# Patient Record
Sex: Male | Born: 1965 | Race: White | Hispanic: No | Marital: Single | Smoking: Never smoker
Health system: Southern US, Community
[De-identification: ages and names within clinical notes are randomized; demographics above are authoritative.]

## PROBLEM LIST (undated history)

## (undated) DIAGNOSIS — I2699 Other pulmonary embolism without acute cor pulmonale: Secondary | ICD-10-CM

## (undated) DIAGNOSIS — I4891 Unspecified atrial fibrillation: Secondary | ICD-10-CM

## (undated) DIAGNOSIS — G959 Disease of spinal cord, unspecified: Secondary | ICD-10-CM

## (undated) DIAGNOSIS — N390 Urinary tract infection, site not specified: Secondary | ICD-10-CM

## (undated) DIAGNOSIS — N319 Neuromuscular dysfunction of bladder, unspecified: Secondary | ICD-10-CM

## (undated) DIAGNOSIS — I10 Essential (primary) hypertension: Secondary | ICD-10-CM

## (undated) DIAGNOSIS — M5030 Other cervical disc degeneration, unspecified cervical region: Secondary | ICD-10-CM

## (undated) DIAGNOSIS — F32A Depression, unspecified: Secondary | ICD-10-CM

## (undated) DIAGNOSIS — G4730 Sleep apnea, unspecified: Secondary | ICD-10-CM

## (undated) DIAGNOSIS — Z86711 Personal history of pulmonary embolism: Secondary | ICD-10-CM

## (undated) DIAGNOSIS — Z86718 Personal history of other venous thrombosis and embolism: Secondary | ICD-10-CM

## (undated) DIAGNOSIS — N400 Enlarged prostate without lower urinary tract symptoms: Secondary | ICD-10-CM

## (undated) DIAGNOSIS — L719 Rosacea, unspecified: Secondary | ICD-10-CM

## (undated) DIAGNOSIS — F419 Anxiety disorder, unspecified: Secondary | ICD-10-CM

## (undated) DIAGNOSIS — G3109 Other frontotemporal dementia: Secondary | ICD-10-CM

## (undated) DIAGNOSIS — R7989 Other specified abnormal findings of blood chemistry: Secondary | ICD-10-CM

## (undated) DIAGNOSIS — E669 Obesity, unspecified: Secondary | ICD-10-CM

## (undated) DIAGNOSIS — D510 Vitamin B12 deficiency anemia due to intrinsic factor deficiency: Secondary | ICD-10-CM

## (undated) DIAGNOSIS — F329 Major depressive disorder, single episode, unspecified: Secondary | ICD-10-CM

## (undated) DIAGNOSIS — F0280 Dementia in other diseases classified elsewhere without behavioral disturbance: Secondary | ICD-10-CM

## (undated) DIAGNOSIS — R945 Abnormal results of liver function studies: Secondary | ICD-10-CM

## (undated) HISTORY — DX: Other pulmonary embolism without acute cor pulmonale: I26.99

## (undated) HISTORY — DX: Personal history of pulmonary embolism: Z86.711

## (undated) HISTORY — DX: Personal history of other venous thrombosis and embolism: Z86.718

## (undated) HISTORY — DX: Abnormal results of liver function studies: R94.5

## (undated) HISTORY — PX: KIDNEY STONE SURGERY: SHX686

## (undated) HISTORY — DX: Unspecified atrial fibrillation: I48.91

## (undated) HISTORY — DX: Urinary tract infection, site not specified: N39.0

## (undated) HISTORY — DX: Rosacea, unspecified: L71.9

## (undated) HISTORY — DX: Disease of spinal cord, unspecified: G95.9

## (undated) HISTORY — PX: SUPRAPUBIC CATHETER INSERTION: SUR719

## (undated) HISTORY — DX: Neuromuscular dysfunction of bladder, unspecified: N31.9

## (undated) HISTORY — DX: Depression, unspecified: F32.A

## (undated) HISTORY — DX: Benign prostatic hyperplasia without lower urinary tract symptoms: N40.0

## (undated) HISTORY — DX: Other cervical disc degeneration, unspecified cervical region: M50.30

## (undated) HISTORY — DX: Other specified abnormal findings of blood chemistry: R79.89

## (undated) HISTORY — DX: Obesity, unspecified: E66.9

## (undated) HISTORY — DX: Major depressive disorder, single episode, unspecified: F32.9

## (undated) HISTORY — DX: Sleep apnea, unspecified: G47.30

## (undated) HISTORY — DX: Essential (primary) hypertension: I10

## (undated) HISTORY — DX: Dementia in other diseases classified elsewhere, unspecified severity, without behavioral disturbance, psychotic disturbance, mood disturbance, and anxiety: G31.09

## (undated) HISTORY — DX: Dementia in other diseases classified elsewhere, unspecified severity, without behavioral disturbance, psychotic disturbance, mood disturbance, and anxiety: F02.80

## (undated) HISTORY — DX: Anxiety disorder, unspecified: F41.9

## (undated) HISTORY — DX: Vitamin B12 deficiency anemia due to intrinsic factor deficiency: D51.0

---

## 2004-03-22 ENCOUNTER — Other Ambulatory Visit: Payer: Self-pay

## 2004-11-22 ENCOUNTER — Inpatient Hospital Stay: Payer: Self-pay | Admitting: Family Medicine

## 2004-11-29 ENCOUNTER — Inpatient Hospital Stay: Payer: Self-pay | Admitting: Family Medicine

## 2005-01-22 ENCOUNTER — Emergency Department: Payer: Self-pay | Admitting: Emergency Medicine

## 2007-01-14 ENCOUNTER — Inpatient Hospital Stay: Payer: Self-pay | Admitting: Internal Medicine

## 2007-04-18 ENCOUNTER — Emergency Department: Payer: Self-pay | Admitting: Emergency Medicine

## 2007-04-30 ENCOUNTER — Ambulatory Visit: Payer: Self-pay | Admitting: Urology

## 2007-05-02 ENCOUNTER — Ambulatory Visit: Payer: Self-pay | Admitting: Urology

## 2007-05-17 ENCOUNTER — Ambulatory Visit: Payer: Self-pay | Admitting: Urology

## 2007-06-11 ENCOUNTER — Ambulatory Visit: Payer: Self-pay | Admitting: Urology

## 2008-05-16 ENCOUNTER — Emergency Department: Payer: Self-pay

## 2008-06-15 ENCOUNTER — Inpatient Hospital Stay: Payer: Self-pay | Admitting: Gastroenterology

## 2008-10-17 ENCOUNTER — Emergency Department: Payer: Self-pay | Admitting: Emergency Medicine

## 2010-01-14 ENCOUNTER — Inpatient Hospital Stay: Payer: Self-pay | Admitting: Student

## 2010-05-13 ENCOUNTER — Emergency Department: Payer: Self-pay | Admitting: Emergency Medicine

## 2010-10-20 ENCOUNTER — Emergency Department: Payer: Self-pay | Admitting: Emergency Medicine

## 2010-12-12 ENCOUNTER — Ambulatory Visit: Payer: Self-pay | Admitting: Urology

## 2011-01-23 ENCOUNTER — Emergency Department: Payer: Self-pay | Admitting: Emergency Medicine

## 2011-02-07 ENCOUNTER — Ambulatory Visit: Payer: Self-pay | Admitting: Urology

## 2011-02-26 ENCOUNTER — Emergency Department: Payer: Self-pay | Admitting: Emergency Medicine

## 2011-05-12 ENCOUNTER — Ambulatory Visit: Payer: Self-pay | Admitting: Family Medicine

## 2011-05-13 ENCOUNTER — Inpatient Hospital Stay: Payer: Self-pay | Admitting: Internal Medicine

## 2011-09-12 DIAGNOSIS — I4891 Unspecified atrial fibrillation: Secondary | ICD-10-CM

## 2011-09-12 HISTORY — DX: Unspecified atrial fibrillation: I48.91

## 2011-09-25 ENCOUNTER — Encounter: Payer: Self-pay | Admitting: Cardiothoracic Surgery

## 2011-09-25 ENCOUNTER — Encounter: Payer: Self-pay | Admitting: Nurse Practitioner

## 2011-10-13 ENCOUNTER — Encounter: Payer: Self-pay | Admitting: Nurse Practitioner

## 2011-10-13 ENCOUNTER — Encounter: Payer: Self-pay | Admitting: Cardiothoracic Surgery

## 2011-11-16 ENCOUNTER — Encounter: Payer: Self-pay | Admitting: Nurse Practitioner

## 2011-11-16 ENCOUNTER — Encounter: Payer: Self-pay | Admitting: Cardiothoracic Surgery

## 2011-12-12 ENCOUNTER — Ambulatory Visit: Payer: Self-pay | Admitting: Urology

## 2011-12-15 ENCOUNTER — Ambulatory Visit: Payer: Self-pay | Admitting: Rheumatology

## 2012-02-04 ENCOUNTER — Emergency Department: Payer: Self-pay | Admitting: Emergency Medicine

## 2012-04-01 ENCOUNTER — Encounter: Payer: Self-pay | Admitting: Nurse Practitioner

## 2012-04-01 ENCOUNTER — Encounter: Payer: Self-pay | Admitting: Cardiothoracic Surgery

## 2012-04-11 ENCOUNTER — Encounter: Payer: Self-pay | Admitting: Nurse Practitioner

## 2012-04-11 ENCOUNTER — Encounter: Payer: Self-pay | Admitting: Cardiothoracic Surgery

## 2012-04-13 ENCOUNTER — Emergency Department: Payer: Self-pay | Admitting: Unknown Physician Specialty

## 2012-04-13 LAB — URINALYSIS, COMPLETE
Bilirubin,UR: NEGATIVE
Ketone: NEGATIVE
Ph: 8 (ref 4.5–8.0)
Specific Gravity: 1.003 (ref 1.003–1.030)
Squamous Epithelial: 1

## 2012-07-31 ENCOUNTER — Encounter: Payer: Self-pay | Admitting: Nurse Practitioner

## 2012-07-31 ENCOUNTER — Encounter: Payer: Self-pay | Admitting: Cardiothoracic Surgery

## 2012-08-05 ENCOUNTER — Inpatient Hospital Stay: Payer: Self-pay | Admitting: Specialist

## 2012-08-05 LAB — COMPREHENSIVE METABOLIC PANEL
Albumin: 4 g/dL (ref 3.4–5.0)
BUN: 6 mg/dL — ABNORMAL LOW (ref 7–18)
Calcium, Total: 8.6 mg/dL (ref 8.5–10.1)
Chloride: 103 mmol/L (ref 98–107)
Co2: 28 mmol/L (ref 21–32)
Creatinine: 0.48 mg/dL — ABNORMAL LOW (ref 0.60–1.30)
EGFR (African American): >60
EGFR (Non-African Amer.): >60
Osmolality: 275 (ref 275–301)
Potassium: 3.5 mmol/L (ref 3.5–5.1)
SGOT(AST): 25 U/L (ref 15–37)
SGPT (ALT): 37 U/L (ref 12–78)
Sodium: 138 mmol/L (ref 136–145)
Total Protein: 7.8 g/dL (ref 6.4–8.2)

## 2012-08-05 LAB — PROTIME-INR: Prothrombin Time: 18.2 secs — ABNORMAL HIGH (ref 11.5–14.7)

## 2012-08-05 LAB — CBC
HCT: 45.6 % (ref 40.0–52.0)
HGB: 15.5 g/dL (ref 13.0–18.0)
MCH: 30.8 pg (ref 26.0–34.0)
MCHC: 34.1 g/dL (ref 32.0–36.0)
MCV: 90 fL (ref 80–100)
RBC: 5.05 10*6/uL (ref 4.40–5.90)
WBC: 13.1 10*3/uL — ABNORMAL HIGH (ref 3.8–10.6)

## 2012-08-05 LAB — URINALYSIS, COMPLETE
Glucose,UR: NEGATIVE mg/dL (ref 0–75)
Nitrite: NEGATIVE
Ph: 8 (ref 4.5–8.0)
RBC,UR: 1 /HPF (ref 0–5)
Specific Gravity: 1.018 (ref 1.003–1.030)
WBC UR: 1 /HPF (ref 0–5)

## 2012-08-05 LAB — MAGNESIUM: Magnesium: 2 mg/dL

## 2012-08-06 LAB — CBC WITH DIFFERENTIAL/PLATELET
Basophil #: 0.1 10*3/uL (ref 0.0–0.1)
Basophil %: 0.6 %
Eosinophil %: 0.9 %
HGB: 15.1 g/dL (ref 13.0–18.0)
Lymphocyte #: 2.3 10*3/uL (ref 1.0–3.6)
Lymphocyte %: 19.9 %
MCHC: 33.9 g/dL (ref 32.0–36.0)
Monocyte #: 1.1 x10 3/mm — ABNORMAL HIGH (ref 0.2–1.0)
Monocyte %: 9.6 %
Neutrophil #: 7.9 10*3/uL — ABNORMAL HIGH (ref 1.4–6.5)
Neutrophil %: 69 %
RDW: 13.9 % (ref 11.5–14.5)
WBC: 11.5 10*3/uL — ABNORMAL HIGH (ref 3.8–10.6)

## 2012-08-06 LAB — COMPREHENSIVE METABOLIC PANEL
Anion Gap: 9 (ref 7–16)
BUN: 7 mg/dL (ref 7–18)
Calcium, Total: 8.9 mg/dL (ref 8.5–10.1)
Chloride: 104 mmol/L (ref 98–107)
Co2: 28 mmol/L (ref 21–32)
EGFR (African American): >60
EGFR (Non-African Amer.): >60
Osmolality: 280 (ref 275–301)
Potassium: 3.4 mmol/L — ABNORMAL LOW (ref 3.5–5.1)
SGOT(AST): 24 U/L (ref 15–37)
Sodium: 141 mmol/L (ref 136–145)

## 2012-08-06 LAB — PROTIME-INR
INR: 1.7
Prothrombin Time: 20.7 secs — ABNORMAL HIGH (ref 11.5–14.7)

## 2012-08-06 LAB — MAGNESIUM: Magnesium: 2.2 mg/dL

## 2012-08-06 LAB — LIPID PANEL
Cholesterol: 189 mg/dL (ref 0–200)
HDL Cholesterol: 49 mg/dL (ref 40–60)
VLDL Cholesterol, Calc: 25 mg/dL (ref 5–40)

## 2012-08-06 LAB — HEMOGLOBIN A1C: Hemoglobin A1C: 5.3 % (ref 4.2–6.3)

## 2012-08-07 DIAGNOSIS — I959 Hypotension, unspecified: Secondary | ICD-10-CM

## 2012-08-07 DIAGNOSIS — I4891 Unspecified atrial fibrillation: Secondary | ICD-10-CM

## 2012-08-07 LAB — BASIC METABOLIC PANEL
Anion Gap: 7 (ref 7–16)
Calcium, Total: 8.9 mg/dL (ref 8.5–10.1)
Chloride: 110 mmol/L — ABNORMAL HIGH (ref 98–107)
Co2: 27 mmol/L (ref 21–32)
EGFR (African American): >60
Osmolality: 290 (ref 275–301)

## 2012-08-07 LAB — PROTIME-INR: INR: 2.1

## 2012-08-11 ENCOUNTER — Encounter: Payer: Self-pay | Admitting: Cardiothoracic Surgery

## 2012-08-11 ENCOUNTER — Encounter: Payer: Self-pay | Admitting: Nurse Practitioner

## 2012-08-21 ENCOUNTER — Encounter: Payer: Self-pay | Admitting: Cardiovascular Disease

## 2012-08-21 ENCOUNTER — Ambulatory Visit (INDEPENDENT_AMBULATORY_CARE_PROVIDER_SITE_OTHER): Payer: Medicare Other | Admitting: Cardiovascular Disease

## 2012-08-21 VITALS — BP 120/82 | HR 68 | Ht 71.0 in

## 2012-08-21 DIAGNOSIS — I10 Essential (primary) hypertension: Secondary | ICD-10-CM

## 2012-08-21 DIAGNOSIS — Z86711 Personal history of pulmonary embolism: Secondary | ICD-10-CM | POA: Insufficient documentation

## 2012-08-21 DIAGNOSIS — F0390 Unspecified dementia without behavioral disturbance: Secondary | ICD-10-CM

## 2012-08-21 DIAGNOSIS — I4891 Unspecified atrial fibrillation: Secondary | ICD-10-CM

## 2012-08-21 DIAGNOSIS — E785 Hyperlipidemia, unspecified: Secondary | ICD-10-CM

## 2012-08-21 MED ORDER — METOPROLOL TARTRATE 25 MG PO TABS: 25.0000 mg | ORAL_TABLET | Freq: Two times a day (BID) | ORAL | Status: DC

## 2012-08-21 MED ORDER — FLECAINIDE ACETATE 50 MG PO TABS: 50.0000 mg | ORAL_TABLET | Freq: Two times a day (BID) | ORAL | Status: DC

## 2012-08-21 NOTE — Progress Notes (Signed)
Patient ID: Kent [name], male    DOB: [birth year], 46 y.o.   MRN: [id number]  HPI Comments: Kent [name] is a 46 yo gentleman with frontotemporal dementia starting at age 62 now mostly bedridden or wheelchair bound, lives with his parents, history of leg infection, PE, sleep apnea, myelopathy, hypertension, depressive disorder, neurogenic bladder, obesity, chronic urinary tract infections and anemia who presented to the hospital November 26 with mental status changes. Cardiology was consulted for atrial fibrillation.  He is essentially noncommunicative at baseline. Family reports that his communication was even worse than baseline. He does need full assistance for all ADLs. He was sweating more, quivering, shivering, spitting up clear fluid with stuffy nose and foam, and from his mouth. He was started on diltiazem and this was changed to amiodarone by mouth and she converted to normal sinus rhythm while in the hospital. Amiodarone was changed to flecainide at time of discharge as it was felt he would have for followup and in frequent lab work.  His father presents with him today and reports that overall he has been at his baseline. No further sweating, shivering or signs of heart trouble. Blood pressure has been relatively stable at home.   EKG today shows normal sinus rhythm with rate 68 beats per minute with no significant ST or T wave changes  Echocardiogram in the hospital was essentially normal, diastolic dysfunction noted   Outpatient Encounter Prescriptions as of 08/21/2012  Medication Sig Dispense Refill  . citalopram (CELEXA) 20 MG tablet Take 20 mg by mouth 2 (two) times daily.      Marland Kitchen donepezil (ARICEPT) 10 MG tablet Take 10 mg by mouth at bedtime as needed.      . flecainide (TAMBOCOR) 50 MG tablet Take 1 tablet (50 mg total) by mouth 2 (two) times daily.  60 tablet  11  . guaiFENesin (MUCINEX) 600 MG 12 hr tablet Take 600 mg by mouth 2 (two) times daily as needed.      Marland Kitchen  ketoconazole (NIZORAL) 2 % shampoo Apply 1 application topically 2 (two) times a week.      . loratadine (CLARITIN) 10 MG tablet Take 10 mg by mouth daily.      Marland Kitchen LORazepam (ATIVAN) 0.5 MG tablet Take 0.5 mg by mouth 2 (two) times daily.      Marland Kitchen lovastatin (MEVACOR) 40 MG tablet Take 40 mg by mouth at bedtime.      . metoprolol tartrate (LOPRESSOR) 25 MG tablet Take 1 tablet (25 mg total) by mouth 2 (two) times daily. He has only been taking this once a day   60 tablet  11  . metroNIDAZOLE (METROGEL) 1 % gel Apply 1 application topically daily.      Marland Kitchen omeprazole (PRILOSEC) 40 MG capsule Take 40 mg by mouth daily.      . Polyethylene Glycol 3350 (MIRALAX PO) Take by mouth daily.      . QUEtiapine (SEROQUEL) 50 MG tablet Take 50 mg by mouth at bedtime.      . Tamsulosin HCl (FLOMAX) 0.4 MG CAPS Take 0.4 mg by mouth daily.      . vitamin B-12 (CYANOCOBALAMIN) 1000 MCG tablet Take 1,000 mcg by mouth daily.      Marland Kitchen warfarin (COUMADIN) 7.5 MG tablet Take 7.5 mg by mouth daily. Take 5 mg one tablet every other day.      . zolpidem (AMBIEN CR) 12.5 MG CR tablet Take 12.5 mg by mouth at bedtime as needed.  Review of Systems  Unable to perform ROS   BP 120/82  Pulse 68  Ht 5\' 11"  (1.803 m)  Physical Exam  Nursing note and vitals reviewed. Constitutional: He appears well-developed and well-nourished.  HENT:  Head: Normocephalic.  Nose: Nose normal.  Mouth/Throat: Oropharynx is clear and moist.  Eyes: Conjunctivae normal are normal.  Neck: Normal range of motion. Neck supple. No JVD present.  Cardiovascular: Normal rate, regular rhythm, S1 normal, S2 normal, normal heart sounds and intact distal pulses.  Exam reveals no gallop and no friction rub.   No murmur heard. Pulmonary/Chest: Effort normal and breath sounds normal. No respiratory distress. He has no wheezes. He has no rales. He exhibits no tenderness.  Abdominal: Soft. Bowel sounds are normal. He exhibits no distension. There is  no tenderness.  Musculoskeletal: He exhibits no edema and no tenderness.       Unable to test  Lymphadenopathy:    He has no cervical adenopathy.  Neurological: He is alert. Coordination normal.  Skin: Skin is warm and dry. No rash noted. No erythema.  Psychiatric: His behavior is normal.           Assessment and Plan

## 2012-08-21 NOTE — Assessment & Plan Note (Signed)
Currently on a statin.

## 2012-08-21 NOTE — Assessment & Plan Note (Signed)
On warfarin.

## 2012-08-21 NOTE — Assessment & Plan Note (Signed)
Frontotemporal dementia since age 46, severe.

## 2012-08-21 NOTE — Assessment & Plan Note (Signed)
Maintaining normal sinus rhythm. Continue flecainide 50 mg twice a day with metoprolol 12.5 mg twice a day.

## 2012-08-21 NOTE — Progress Notes (Signed)
Unable to weight, wheelchair bound

## 2012-08-21 NOTE — Patient Instructions (Addendum)
You are doing well. Please continue flecainide twice a day Take metoprolol 1/2 pill twice a day with flecainide  If you notice rapid irregular rhythm, take an extra flecainide and metoprolol, wait two hours. If not back in normal rhythm, call the office   Please call us if you have new issues that need to be addressed before your next appt.  Your physician wants you to follow-up in: 6 months.  You will receive a reminder letter in the mail two months in advance. If you don't receive a letter, please call our office to schedule the follow-up appointment.

## 2012-08-21 NOTE — Assessment & Plan Note (Signed)
Is suggested to his father that he watch his blood pressure periodically. Today is within normal range.

## 2012-08-26 ENCOUNTER — Encounter: Payer: Self-pay | Admitting: Cardiovascular Disease

## 2012-08-29 ENCOUNTER — Encounter: Payer: Self-pay | Admitting: Cardiovascular Disease

## 2012-10-04 ENCOUNTER — Other Ambulatory Visit: Payer: Self-pay | Admitting: *Deleted

## 2012-10-04 MED ORDER — FLECAINIDE ACETATE 50 MG PO TABS: 50.0000 mg | ORAL_TABLET | Freq: Two times a day (BID) | ORAL | Status: DC

## 2012-10-04 NOTE — Telephone Encounter (Signed)
Refilled Flecainide. 

## 2012-12-29 ENCOUNTER — Emergency Department: Payer: Self-pay | Admitting: Emergency Medicine

## 2012-12-30 LAB — URINALYSIS, COMPLETE
Blood: NEGATIVE
Protein: NEGATIVE
Squamous Epithelial: NONE SEEN
WBC UR: 608 /HPF (ref 0–5)

## 2012-12-30 LAB — COMPREHENSIVE METABOLIC PANEL
Alkaline Phosphatase: 72 U/L (ref 50–136)
Anion Gap: 7 (ref 7–16)
BUN: 10 mg/dL (ref 7–18)
Bilirubin,Total: 1 mg/dL (ref 0.2–1.0)
Calcium, Total: 8.7 mg/dL (ref 8.5–10.1)
Chloride: 104 mmol/L (ref 98–107)
Co2: 26 mmol/L (ref 21–32)
EGFR (African American): >60
EGFR (Non-African Amer.): >60
Glucose: 111 mg/dL — ABNORMAL HIGH (ref 65–99)
Osmolality: 274 (ref 275–301)
Potassium: 3.3 mmol/L — ABNORMAL LOW (ref 3.5–5.1)
SGPT (ALT): 24 U/L (ref 12–78)

## 2012-12-30 LAB — CBC WITH DIFFERENTIAL/PLATELET
Basophil #: 0.1 10*3/uL (ref 0.0–0.1)
Basophil %: 0.3 %
Eosinophil #: 0 10*3/uL (ref 0.0–0.7)
HCT: 40.1 % (ref 40.0–52.0)
HGB: 13.6 g/dL (ref 13.0–18.0)
Lymphocyte %: 5.2 %
Neutrophil %: 85.7 %
Platelet: 277 10*3/uL (ref 150–440)
RBC: 4.41 10*6/uL (ref 4.40–5.90)
RDW: 13.2 % (ref 11.5–14.5)
WBC: 26.2 10*3/uL — ABNORMAL HIGH (ref 3.8–10.6)

## 2013-06-30 DIAGNOSIS — N200 Calculus of kidney: Secondary | ICD-10-CM | POA: Insufficient documentation

## 2013-06-30 DIAGNOSIS — N3941 Urge incontinence: Secondary | ICD-10-CM | POA: Insufficient documentation

## 2013-06-30 DIAGNOSIS — R3129 Other microscopic hematuria: Secondary | ICD-10-CM | POA: Insufficient documentation

## 2013-09-15 ENCOUNTER — Other Ambulatory Visit: Payer: Self-pay | Admitting: Cardiovascular Disease

## 2013-09-17 ENCOUNTER — Other Ambulatory Visit: Payer: Self-pay

## 2013-09-17 ENCOUNTER — Other Ambulatory Visit: Payer: Self-pay | Admitting: Cardiovascular Disease

## 2013-09-17 ENCOUNTER — Other Ambulatory Visit: Payer: Self-pay | Admitting: *Deleted

## 2013-09-17 MED ORDER — METOPROLOL TARTRATE 25 MG PO TABS: ORAL_TABLET | ORAL | Status: DC

## 2013-09-17 MED ORDER — METOPROLOL TARTRATE 25 MG PO TABS: 25.0000 mg | ORAL_TABLET | Freq: Two times a day (BID) | ORAL | Status: DC

## 2013-09-17 NOTE — Telephone Encounter (Signed)
Requested Prescriptions   Signed Prescriptions Disp Refills  . metoprolol tartrate (LOPRESSOR) 25 MG tablet 60 tablet 0    Sig: Take 1 tablet (25 mg total) by mouth 2 (two) times daily.    Authorizing Provider: Minna Merritts    Ordering User: Britt Bottom

## 2013-09-27 ENCOUNTER — Other Ambulatory Visit: Payer: Self-pay | Admitting: Cardiovascular Disease

## 2013-09-29 ENCOUNTER — Other Ambulatory Visit: Payer: Self-pay | Admitting: *Deleted

## 2013-09-29 MED ORDER — FLECAINIDE ACETATE 50 MG PO TABS: ORAL_TABLET | ORAL | Status: DC

## 2013-10-16 ENCOUNTER — Ambulatory Visit (INDEPENDENT_AMBULATORY_CARE_PROVIDER_SITE_OTHER): Payer: Medicare Other | Admitting: Cardiovascular Disease

## 2013-10-16 ENCOUNTER — Encounter: Payer: Self-pay | Admitting: Cardiovascular Disease

## 2013-10-16 VITALS — BP 120/80 | HR 61 | Ht 71.0 in | Wt 285.0 lb

## 2013-10-16 DIAGNOSIS — I10 Essential (primary) hypertension: Secondary | ICD-10-CM

## 2013-10-16 DIAGNOSIS — F0390 Unspecified dementia without behavioral disturbance: Secondary | ICD-10-CM

## 2013-10-16 DIAGNOSIS — I4891 Unspecified atrial fibrillation: Secondary | ICD-10-CM

## 2013-10-16 DIAGNOSIS — E785 Hyperlipidemia, unspecified: Secondary | ICD-10-CM

## 2013-10-16 NOTE — Patient Instructions (Signed)
You are doing well. No medication changes were made.  Please call us if you have new issues that need to be addressed before your next appt.  Your physician wants you to follow-up in: 6 months.  You will receive a reminder letter in the mail two months in advance. If you don't receive a letter, please call our office to schedule the follow-up appointment.   

## 2013-10-16 NOTE — Assessment & Plan Note (Signed)
Reports that dementia has progressed over the past year. Essentially requires full-time care

## 2013-10-16 NOTE — Assessment & Plan Note (Signed)
Suggested he continue on lovastatin

## 2013-10-16 NOTE — Progress Notes (Signed)
Patient ID: Kent [name], male    DOB: 1966/03/20, 48 y.o.   MRN: [id number]  HPI Comments: Kent [name] is a 48 yo gentleman with frontotemporal dementia starting at age 76 now mostly bedridden or wheelchair bound, lives with his parents, history of leg infection, PE, sleep apnea, myelopathy, hypertension, depressive disorder, neurogenic bladder, obesity, chronic urinary tract infections and anemia who presented to the hospital November 26 with mental status changes. Cardiology was consulted for atrial fibrillation.  He is essentially noncommunicative at baseline. Family reports that his communication has become even worse over the past year. He has 60 hours of help at home, parents 2 the rest.  He does need full assistance for all ADLs. No significant lower extremity edema, no shortness of breath or pain episodes  Blood pressure has been relatively stable at home.   EKG today shows normal sinus rhythm with rate 61 beats per minute with no significant ST or T wave changes Significant motion artifact noted Total cholesterol 184, LDL 101, HDL 42  Echocardiogram in the hospital was essentially normal, diastolic dysfunction noted   Outpatient Encounter Prescriptions as of 10/16/2013  Medication Sig  . citalopram (CELEXA) 20 MG tablet Take 20 mg by mouth 2 (two) times daily.  . Cyanocobalamin (VITAMIN B-12 IJ) Inject 500 mcg as directed.  . desonide (DESOWEN) 0.05 % cream Apply topically as directed.  . donepezil (ARICEPT) 10 MG tablet Take 10 mg by mouth at bedtime as needed.  Marland Kitchen econazole nitrate 1 % cream Apply 1 application topically as directed.  . flecainide (TAMBOCOR) 50 MG tablet TAKE 1 TABLET (50 MG TOTAL) BY MOUTH 2 (TWO) TIMES DAILY.  Marland Kitchen guaiFENesin (MUCINEX) 600 MG 12 hr tablet Take 600 mg by mouth 2 (two) times daily as needed.  Marland Kitchen ketoconazole (NIZORAL) 2 % shampoo Apply 1 application topically 2 (two) times a week.  . loratadine (CLARITIN) 10 MG tablet Take 10 mg by mouth daily.   Marland Kitchen LORazepam (ATIVAN) 0.5 MG tablet Take 0.5 mg by mouth 2 (two) times daily.  Marland Kitchen lovastatin (MEVACOR) 40 MG tablet Take 40 mg by mouth at bedtime.  . metoprolol tartrate (LOPRESSOR) 25 MG tablet Take 12.5 mg by mouth 2 (two) times daily.  . metroNIDAZOLE (METROGEL) 1 % gel Apply 1 application topically daily.  . NON FORMULARY cpap daily at bedtime.  Marland Kitchen omeprazole (PRILOSEC) 40 MG capsule Take 40 mg by mouth daily.  . Polyethylene Glycol 3350 (MIRALAX PO) Take by mouth daily.  . QUEtiapine (SEROQUEL) 50 MG tablet Take 50 mg by mouth at bedtime.  . Tamsulosin HCl (FLOMAX) 0.4 MG CAPS Take 0.4 mg by mouth daily.  Marland Kitchen warfarin (COUMADIN) 7.5 MG tablet Take 7.5 mg by mouth daily. Take 5 mg one tablet every other day.    Review of Systems  Unable to perform ROS Constitutional: Negative.   HENT: Negative.   Eyes: Negative.   Respiratory: Negative.   Cardiovascular: Negative.   Gastrointestinal: Negative.   Endocrine: Negative.   Musculoskeletal: Negative.   Skin: Negative.   Allergic/Immunologic: Negative.   Neurological: Negative.   Hematological: Negative.   Psychiatric/Behavioral: Negative.     BP 120/80  Pulse 61  Ht 5\' 11"  (1.803 m)  Wt 285 lb (129.275 kg)  BMI 39.77 kg/m2  Physical Exam  Nursing note and vitals reviewed. Constitutional: He appears well-developed and well-nourished.  Presenting in a wheelchair, noncommunicative, does not appear to be in any distress  HENT:  Head: Normocephalic.  Nose: Nose normal.  Mouth/Throat: Oropharynx is clear and moist.  Eyes: Conjunctivae are normal.  Neck: Normal range of motion. Neck supple. No JVD present.  Cardiovascular: Normal rate, regular rhythm, S1 normal, S2 normal, normal heart sounds and intact distal pulses.  Exam reveals no gallop and no friction rub.   No murmur heard. Pulmonary/Chest: Effort normal and breath sounds normal. No respiratory distress. He has no wheezes. He has no rales. He exhibits no tenderness.   Abdominal: Soft. Bowel sounds are normal. He exhibits no distension. There is no tenderness.  Musculoskeletal: He exhibits no edema and no tenderness.  Unable to test  Lymphadenopathy:    He has no cervical adenopathy.  Neurological: He is alert. Coordination normal.  Skin: Skin is warm and dry. No rash noted. No erythema.  Psychiatric: His behavior is normal.      Assessment and Plan

## 2013-10-16 NOTE — Assessment & Plan Note (Signed)
Blood pressure is well controlled on today's visit. No changes made to the medications. 

## 2013-10-16 NOTE — Assessment & Plan Note (Signed)
Maintaining normal sinus rhythm. Suggested we continue metoprolol and flecainide. He is on anticoagulation

## 2013-11-22 ENCOUNTER — Other Ambulatory Visit: Payer: Self-pay | Admitting: Cardiovascular Disease

## 2013-12-24 ENCOUNTER — Ambulatory Visit: Payer: Self-pay | Admitting: Family Medicine

## 2014-01-05 ENCOUNTER — Emergency Department: Payer: Self-pay | Admitting: Emergency Medicine

## 2014-01-05 LAB — URINALYSIS, COMPLETE
BACTERIA: NONE SEEN
Bilirubin,UR: NEGATIVE
Glucose,UR: NEGATIVE mg/dL (ref 0–75)
Ketone: NEGATIVE
NITRITE: NEGATIVE
Ph: 6 (ref 4.5–8.0)
Protein: 100
SPECIFIC GRAVITY: 1.011 (ref 1.003–1.030)
SQUAMOUS EPITHELIAL: NONE SEEN

## 2014-01-05 LAB — CBC
HCT: 43.3 % (ref 40.0–52.0)
HGB: 14.4 g/dL (ref 13.0–18.0)
MCH: 31 pg (ref 26.0–34.0)
MCHC: 33.3 g/dL (ref 32.0–36.0)
MCV: 93 fL (ref 80–100)
Platelet: 258 10*3/uL (ref 150–440)
RBC: 4.65 10*6/uL (ref 4.40–5.90)
RDW: 13.9 % (ref 11.5–14.5)
WBC: 9 10*3/uL (ref 3.8–10.6)

## 2014-01-05 LAB — APTT: ACTIVATED PTT: 37.2 s — AB (ref 23.6–35.9)

## 2014-01-05 LAB — PROTIME-INR
INR: 2
Prothrombin Time: 22.2 secs — ABNORMAL HIGH (ref 11.5–14.7)

## 2014-01-28 DIAGNOSIS — R339 Retention of urine, unspecified: Secondary | ICD-10-CM | POA: Insufficient documentation

## 2014-01-28 DIAGNOSIS — G988 Other disorders of nervous system: Secondary | ICD-10-CM | POA: Insufficient documentation

## 2014-04-12 ENCOUNTER — Inpatient Hospital Stay: Payer: Self-pay | Admitting: Internal Medicine

## 2014-04-12 LAB — URINALYSIS, COMPLETE
BILIRUBIN, UR: NEGATIVE
Glucose,UR: NEGATIVE mg/dL (ref 0–75)
Ketone: NEGATIVE
NITRITE: POSITIVE
Ph: 8 (ref 4.5–8.0)
Protein: NEGATIVE
RBC,UR: 33 /HPF (ref 0–5)
SQUAMOUS EPITHELIAL: NONE SEEN
Specific Gravity: 1.005 (ref 1.003–1.030)
WBC UR: 704 /HPF (ref 0–5)

## 2014-04-12 LAB — COMPREHENSIVE METABOLIC PANEL
ALK PHOS: 74 U/L
AST: 30 U/L (ref 15–37)
Albumin: 3.4 g/dL (ref 3.4–5.0)
Anion Gap: 7 (ref 7–16)
BILIRUBIN TOTAL: 1 mg/dL (ref 0.2–1.0)
BUN: 7 mg/dL (ref 7–18)
CALCIUM: 8.9 mg/dL (ref 8.5–10.1)
CHLORIDE: 103 mmol/L (ref 98–107)
CO2: 29 mmol/L (ref 21–32)
CREATININE: 0.63 mg/dL (ref 0.60–1.30)
EGFR (African American): >60
Glucose: 112 mg/dL — ABNORMAL HIGH (ref 65–99)
OSMOLALITY: 276 (ref 275–301)
POTASSIUM: 3.7 mmol/L (ref 3.5–5.1)
SGPT (ALT): 41 U/L
SODIUM: 139 mmol/L (ref 136–145)
TOTAL PROTEIN: 7.4 g/dL (ref 6.4–8.2)

## 2014-04-12 LAB — CBC
HCT: 43.9 % (ref 40.0–52.0)
HGB: 14.3 g/dL (ref 13.0–18.0)
MCH: 30 pg (ref 26.0–34.0)
MCHC: 32.5 g/dL (ref 32.0–36.0)
MCV: 92 fL (ref 80–100)
Platelet: 288 10*3/uL (ref 150–440)
RBC: 4.77 10*6/uL (ref 4.40–5.90)
RDW: 14.3 % (ref 11.5–14.5)
WBC: 20.9 10*3/uL — ABNORMAL HIGH (ref 3.8–10.6)

## 2014-04-12 LAB — PROTIME-INR
INR: 2.7
Prothrombin Time: 27.6 secs — ABNORMAL HIGH (ref 11.5–14.7)

## 2014-04-12 LAB — TROPONIN I: Troponin-I: <0.02 ng/mL

## 2014-04-12 LAB — MAGNESIUM: Magnesium: 1.8 mg/dL

## 2014-04-13 LAB — CBC WITH DIFFERENTIAL/PLATELET
BASOS ABS: 0 10*3/uL (ref 0.0–0.1)
Basophil %: 0.2 %
Eosinophil #: 0 10*3/uL (ref 0.0–0.7)
Eosinophil %: 0.2 %
HCT: 38.3 % — ABNORMAL LOW (ref 40.0–52.0)
HGB: 12.8 g/dL — AB (ref 13.0–18.0)
LYMPHS ABS: 1.4 10*3/uL (ref 1.0–3.6)
Lymphocyte %: 9.5 %
MCH: 30.4 pg (ref 26.0–34.0)
MCHC: 33.3 g/dL (ref 32.0–36.0)
MCV: 91 fL (ref 80–100)
MONO ABS: 1.5 x10 3/mm — AB (ref 0.2–1.0)
Monocyte %: 10.3 %
Neutrophil #: 11.4 10*3/uL — ABNORMAL HIGH (ref 1.4–6.5)
Neutrophil %: 79.8 %
PLATELETS: 267 10*3/uL (ref 150–440)
RBC: 4.19 10*6/uL — AB (ref 4.40–5.90)
RDW: 14.3 % (ref 11.5–14.5)
WBC: 14.3 10*3/uL — AB (ref 3.8–10.6)

## 2014-04-13 LAB — PROTIME-INR
INR: 2.9
Prothrombin Time: 29.2 secs — ABNORMAL HIGH (ref 11.5–14.7)

## 2014-04-14 LAB — CBC WITH DIFFERENTIAL/PLATELET
BASOS ABS: 0.1 10*3/uL (ref 0.0–0.1)
Basophil %: 0.7 %
EOS ABS: 0.1 10*3/uL (ref 0.0–0.7)
Eosinophil %: 0.8 %
HCT: 37.2 % — ABNORMAL LOW (ref 40.0–52.0)
HGB: 12.6 g/dL — ABNORMAL LOW (ref 13.0–18.0)
LYMPHS ABS: 1.2 10*3/uL (ref 1.0–3.6)
Lymphocyte %: 12.7 %
MCH: 30.9 pg (ref 26.0–34.0)
MCHC: 33.8 g/dL (ref 32.0–36.0)
MCV: 91 fL (ref 80–100)
Monocyte #: 1.3 x10 3/mm — ABNORMAL HIGH (ref 0.2–1.0)
Monocyte %: 13.2 %
Neutrophil #: 6.9 10*3/uL — ABNORMAL HIGH (ref 1.4–6.5)
Neutrophil %: 72.6 %
Platelet: 261 10*3/uL (ref 150–440)
RBC: 4.07 10*6/uL — AB (ref 4.40–5.90)
RDW: 14.3 % (ref 11.5–14.5)
WBC: 9.4 10*3/uL (ref 3.8–10.6)

## 2014-04-14 LAB — PROTIME-INR
INR: 2.4
PROTHROMBIN TIME: 25.7 s — AB (ref 11.5–14.7)

## 2014-04-15 LAB — URINE CULTURE

## 2014-04-15 LAB — CBC WITH DIFFERENTIAL/PLATELET
Basophil #: 0.1 10*3/uL (ref 0.0–0.1)
Basophil %: 1.2 %
EOS ABS: 0.1 10*3/uL (ref 0.0–0.7)
Eosinophil %: 1.4 %
HCT: 37.3 % — ABNORMAL LOW (ref 40.0–52.0)
HGB: 12.1 g/dL — ABNORMAL LOW (ref 13.0–18.0)
Lymphocyte #: 1.9 10*3/uL (ref 1.0–3.6)
Lymphocyte %: 19.2 %
MCH: 30.3 pg (ref 26.0–34.0)
MCHC: 32.5 g/dL (ref 32.0–36.0)
MCV: 93 fL (ref 80–100)
Monocyte #: 0.9 x10 3/mm (ref 0.2–1.0)
Monocyte %: 9.3 %
NEUTROS PCT: 68.9 %
Neutrophil #: 6.9 10*3/uL — ABNORMAL HIGH (ref 1.4–6.5)
Platelet: 259 10*3/uL (ref 150–440)
RBC: 4 10*6/uL — ABNORMAL LOW (ref 4.40–5.90)
RDW: 14.5 % (ref 11.5–14.5)
WBC: 10 10*3/uL (ref 3.8–10.6)

## 2014-04-15 LAB — PROTIME-INR
INR: 2.1
PROTHROMBIN TIME: 23.4 s — AB (ref 11.5–14.7)

## 2014-04-16 LAB — CBC WITH DIFFERENTIAL/PLATELET
BASOS ABS: 0.1 10*3/uL (ref 0.0–0.1)
Basophil %: 0.9 %
EOS ABS: 0.2 10*3/uL (ref 0.0–0.7)
EOS PCT: 1.9 %
HCT: 38.4 % — ABNORMAL LOW (ref 40.0–52.0)
HGB: 13 g/dL (ref 13.0–18.0)
Lymphocyte #: 1.8 10*3/uL (ref 1.0–3.6)
Lymphocyte %: 18.1 %
MCH: 30.9 pg (ref 26.0–34.0)
MCHC: 33.7 g/dL (ref 32.0–36.0)
MCV: 92 fL (ref 80–100)
MONO ABS: 1 x10 3/mm (ref 0.2–1.0)
MONOS PCT: 10 %
NEUTROS PCT: 69.1 %
Neutrophil #: 6.8 10*3/uL — ABNORMAL HIGH (ref 1.4–6.5)
PLATELETS: 278 10*3/uL (ref 150–440)
RBC: 4.2 10*6/uL — ABNORMAL LOW (ref 4.40–5.90)
RDW: 14.6 % — AB (ref 11.5–14.5)
WBC: 9.8 10*3/uL (ref 3.8–10.6)

## 2014-04-16 LAB — BASIC METABOLIC PANEL
ANION GAP: 6 — AB (ref 7–16)
BUN: 11 mg/dL (ref 7–18)
Calcium, Total: 8.5 mg/dL (ref 8.5–10.1)
Chloride: 107 mmol/L (ref 98–107)
Co2: 27 mmol/L (ref 21–32)
Creatinine: 0.61 mg/dL (ref 0.60–1.30)
EGFR (Non-African Amer.): >60
Glucose: 96 mg/dL (ref 65–99)
Osmolality: 279 (ref 275–301)
Potassium: 3.8 mmol/L (ref 3.5–5.1)
SODIUM: 140 mmol/L (ref 136–145)

## 2014-04-16 LAB — PROTIME-INR
INR: 1.8
PROTHROMBIN TIME: 20.7 s — AB (ref 11.5–14.7)

## 2014-04-16 LAB — OCCULT BLOOD X 1 CARD TO LAB, STOOL: Occult Blood, Feces: POSITIVE

## 2014-04-17 LAB — CBC WITH DIFFERENTIAL/PLATELET
BASOS ABS: 0.1 10*3/uL (ref 0.0–0.1)
BASOS PCT: 0.9 %
EOS PCT: 2.8 %
Eosinophil #: 0.3 10*3/uL (ref 0.0–0.7)
HCT: 38.1 % — ABNORMAL LOW (ref 40.0–52.0)
HGB: 12.5 g/dL — ABNORMAL LOW (ref 13.0–18.0)
Lymphocyte #: 1.7 10*3/uL (ref 1.0–3.6)
Lymphocyte %: 15.6 %
MCH: 30.4 pg (ref 26.0–34.0)
MCHC: 32.7 g/dL (ref 32.0–36.0)
MCV: 93 fL (ref 80–100)
Monocyte #: 1 x10 3/mm (ref 0.2–1.0)
Monocyte %: 9.6 %
NEUTROS PCT: 71.1 %
Neutrophil #: 7.7 10*3/uL — ABNORMAL HIGH (ref 1.4–6.5)
Platelet: 288 10*3/uL (ref 150–440)
RBC: 4.11 10*6/uL — ABNORMAL LOW (ref 4.40–5.90)
RDW: 14.2 % (ref 11.5–14.5)
WBC: 10.8 10*3/uL — ABNORMAL HIGH (ref 3.8–10.6)

## 2014-04-17 LAB — BASIC METABOLIC PANEL
Anion Gap: 6 — ABNORMAL LOW (ref 7–16)
BUN: 13 mg/dL (ref 7–18)
CHLORIDE: 106 mmol/L (ref 98–107)
CREATININE: 0.62 mg/dL (ref 0.60–1.30)
Calcium, Total: 8.5 mg/dL (ref 8.5–10.1)
Co2: 29 mmol/L (ref 21–32)
EGFR (Non-African Amer.): >60
Glucose: 99 mg/dL (ref 65–99)
Osmolality: 281 (ref 275–301)
Potassium: 3.6 mmol/L (ref 3.5–5.1)
Sodium: 141 mmol/L (ref 136–145)

## 2014-04-17 LAB — CULTURE, BLOOD (SINGLE)

## 2014-04-17 LAB — PROTIME-INR
INR: 1.6
PROTHROMBIN TIME: 18.5 s — AB (ref 11.5–14.7)

## 2014-05-13 DIAGNOSIS — N319 Neuromuscular dysfunction of bladder, unspecified: Secondary | ICD-10-CM | POA: Insufficient documentation

## 2014-06-01 ENCOUNTER — Other Ambulatory Visit: Payer: Self-pay | Admitting: *Deleted

## 2014-06-01 MED ORDER — FLECAINIDE ACETATE 50 MG PO TABS: ORAL_TABLET | ORAL | Status: DC

## 2014-06-01 NOTE — Telephone Encounter (Signed)
Requested Prescriptions   Signed Prescriptions Disp Refills  . flecainide (TAMBOCOR) 50 MG tablet 60 tablet 3    Sig: TAKE 1 TABLET (50 MG TOTAL) BY MOUTH 2 (TWO) TIMES DAILY.    Authorizing Provider: Minna Merritts    Ordering User: Britt Bottom

## 2014-08-08 ENCOUNTER — Other Ambulatory Visit: Payer: Self-pay | Admitting: Cardiovascular Disease

## 2014-08-12 ENCOUNTER — Emergency Department: Payer: Self-pay | Admitting: Emergency Medicine

## 2014-08-12 LAB — BASIC METABOLIC PANEL
Anion Gap: 7 (ref 7–16)
BUN: 10 mg/dL (ref 7–18)
CALCIUM: 8.5 mg/dL (ref 8.5–10.1)
CHLORIDE: 103 mmol/L (ref 98–107)
CREATININE: 0.63 mg/dL (ref 0.60–1.30)
Co2: 29 mmol/L (ref 21–32)
EGFR (African American): >60
EGFR (Non-African Amer.): >60
GLUCOSE: 95 mg/dL (ref 65–99)
Osmolality: 276 (ref 275–301)
Potassium: 4.3 mmol/L (ref 3.5–5.1)
SODIUM: 139 mmol/L (ref 136–145)

## 2014-08-12 LAB — CBC WITH DIFFERENTIAL/PLATELET
Basophil #: 0.1 10*3/uL (ref 0.0–0.1)
Basophil %: 0.5 %
EOS PCT: 1.5 %
Eosinophil #: 0.2 10*3/uL (ref 0.0–0.7)
HCT: 39.6 % — AB (ref 40.0–52.0)
HGB: 12.8 g/dL — ABNORMAL LOW (ref 13.0–18.0)
LYMPHS ABS: 1.8 10*3/uL (ref 1.0–3.6)
LYMPHS PCT: 12.1 %
MCH: 29.6 pg (ref 26.0–34.0)
MCHC: 32.3 g/dL (ref 32.0–36.0)
MCV: 92 fL (ref 80–100)
MONO ABS: 1.3 x10 3/mm — AB (ref 0.2–1.0)
Monocyte %: 8.5 %
NEUTROS PCT: 77.4 %
Neutrophil #: 11.5 10*3/uL — ABNORMAL HIGH (ref 1.4–6.5)
Platelet: 250 10*3/uL (ref 150–440)
RBC: 4.33 10*6/uL — AB (ref 4.40–5.90)
RDW: 14.9 % — ABNORMAL HIGH (ref 11.5–14.5)
WBC: 14.9 10*3/uL — ABNORMAL HIGH (ref 3.8–10.6)

## 2014-08-12 LAB — PROTIME-INR
INR: 3.1
Prothrombin Time: 30.9 secs — ABNORMAL HIGH (ref 11.5–14.7)

## 2014-08-17 ENCOUNTER — Ambulatory Visit: Payer: Medicare Other | Admitting: Cardiovascular Disease

## 2014-08-18 ENCOUNTER — Ambulatory Visit (INDEPENDENT_AMBULATORY_CARE_PROVIDER_SITE_OTHER): Payer: Medicare Other | Admitting: Cardiovascular Disease

## 2014-08-18 ENCOUNTER — Encounter: Payer: Self-pay | Admitting: Cardiovascular Disease

## 2014-08-18 VITALS — BP 110/60 | HR 71 | Ht 71.0 in | Wt 280.0 lb

## 2014-08-18 DIAGNOSIS — I4891 Unspecified atrial fibrillation: Secondary | ICD-10-CM

## 2014-08-18 DIAGNOSIS — E785 Hyperlipidemia, unspecified: Secondary | ICD-10-CM

## 2014-08-18 DIAGNOSIS — F0391 Unspecified dementia with behavioral disturbance: Secondary | ICD-10-CM

## 2014-08-18 DIAGNOSIS — I10 Essential (primary) hypertension: Secondary | ICD-10-CM

## 2014-08-18 NOTE — Progress Notes (Signed)
Patient ID: Kent [name], male    DOB: 1966/01/19, 48 y.o.   MRN: [id number]  HPI Comments: Kent [name] is a 48 yo gentleman with frontotemporal dementia starting at age 52 now mostly bedridden or wheelchair bound, lives with his parents, history of leg infection, PE, sleep apnea, myelopathy, hypertension, depressive disorder, neurogenic bladder, obesity, chronic urinary tract infections and anemia who presented to the hospital November 26 with mental status changes. Cardiology was consulted for atrial fibrillation.  In follow-up today, he presents with his Kent [name]. He is essentially noncommunicative at baseline.  He continues to require significant assistance at home. Some disruptive behavior in the mornings, father has to calm him down, feed him, give him his medications  Recently had chest congestion requiring antibiotics, prednisone and he is on nebulizers at home, Mucinex. Difficulty clearing secretions  He does need full assistance for all ADLs. No significant lower extremity edema, no shortness of breath or pain episodes  Blood pressure has been relatively stable at home.   EKG today shows normal sinus rhythm with rate 71 beats per minute with no significant ST or T wave changes Previous lab work Total cholesterol 184, LDL 101, HDL 42  Prior studies Echocardiogram in the hospital was essentially normal, diastolic dysfunction noted   Outpatient Encounter Prescriptions as of 08/18/2014  Medication Sig  . citalopram (CELEXA) 20 MG tablet Take 20 mg by mouth 2 (two) times daily.  . Cyanocobalamin (VITAMIN B-12 IJ) Inject 500 mcg as directed.  . desonide (DESOWEN) 0.05 % cream Apply topically as directed.  . donepezil (ARICEPT) 10 MG tablet Take 10 mg by mouth at bedtime as needed.  Marland Kitchen econazole nitrate 1 % cream Apply 1 application topically as directed.  . flecainide (TAMBOCOR) 50 MG tablet TAKE 1 TABLET (50 MG TOTAL) BY MOUTH 2 (TWO) TIMES DAILY.  . flecainide (TAMBOCOR) 50 MG  tablet TAKE 1 TABLET (50 MG TOTAL) BY MOUTH 2 (TWO) TIMES DAILY.  Marland Kitchen guaiFENesin (MUCINEX) 600 MG 12 hr tablet Take 600 mg by mouth 2 (two) times daily as needed.  Marland Kitchen ketoconazole (NIZORAL) 2 % shampoo Apply 1 application topically 2 (two) times a week.  . loratadine (CLARITIN) 10 MG tablet Take 10 mg by mouth daily.  Marland Kitchen LORazepam (ATIVAN) 0.5 MG tablet Take 0.5 mg by mouth 2 (two) times daily.  Marland Kitchen lovastatin (MEVACOR) 40 MG tablet Take 40 mg by mouth at bedtime.  . metoprolol tartrate (LOPRESSOR) 25 MG tablet Take 12.5 mg by mouth 2 (two) times daily.  . metoprolol tartrate (LOPRESSOR) 25 MG tablet TAKE 1 TABLET (25 MG TOTAL) BY MOUTH 2 (TWO) TIMES DAILY.  . metroNIDAZOLE (METROGEL) 1 % gel Apply 1 application topically daily.  . NON FORMULARY cpap daily at bedtime.  Marland Kitchen omeprazole (PRILOSEC) 40 MG capsule Take 40 mg by mouth daily.  . Polyethylene Glycol 3350 (MIRALAX PO) Take by mouth daily.  . QUEtiapine (SEROQUEL) 50 MG tablet Take 50 mg by mouth at bedtime.  Marland Kitchen warfarin (COUMADIN) 5 MG tablet Take 5 mg by mouth daily.  . [DISCONTINUED] Tamsulosin HCl (FLOMAX) 0.4 MG CAPS Take 0.4 mg by mouth daily.  . [DISCONTINUED] warfarin (COUMADIN) 7.5 MG tablet Take 7.5 mg by mouth daily. Take 5 mg one tablet every other day.   Social history  reports that he has never smoked. He does not have any smokeless tobacco history on file. He reports that he does not drink alcohol or use illicit drugs.   Review of Systems  Unable  to perform ROS   Ht 5\' 11"  (1.803 m)  Wt 280 lb (127.007 kg)  BMI 39.07 kg/m2  Physical Exam  Constitutional: He appears well-developed and well-nourished.  Presenting in a wheelchair/scooter  HENT:  Head: Normocephalic.  Nose: Nose normal.  Mouth/Throat: Oropharynx is clear and moist.  Eyes: Conjunctivae are normal.  Neck: Normal range of motion. Neck supple. No JVD present.  Cardiovascular: Normal rate, regular rhythm, S1 normal, S2 normal, normal heart sounds and intact  distal pulses.  Exam reveals no gallop and no friction rub.   No murmur heard. Pulmonary/Chest: Effort normal and breath sounds normal. No respiratory distress. He has no wheezes. He has no rales. He exhibits no tenderness.  Abdominal: Soft. Bowel sounds are normal. He exhibits no distension. There is no tenderness.  Musculoskeletal: Normal range of motion. He exhibits no edema or tenderness.  Lymphadenopathy:    He has no cervical adenopathy.  Neurological: He is alert. Coordination abnormal.  Skin: Skin is warm and dry. No rash noted. No erythema.  Psychiatric:      Assessment and Plan   Nursing note and vitals reviewed.

## 2014-08-18 NOTE — Assessment & Plan Note (Signed)
Encouraged him to stay on the statin

## 2014-08-18 NOTE — Assessment & Plan Note (Signed)
Significant progression over the past several years, appears relatively stable on today's visit compared to last year

## 2014-08-18 NOTE — Patient Instructions (Signed)
You are doing well. No medication changes were made.  Please call us if you have new issues that need to be addressed before your next appt.  Your physician wants you to follow-up in: 12 months.  You will receive a reminder letter in the mail two months in advance. If you don't receive a letter, please call our office to schedule the follow-up appointment. 

## 2014-08-18 NOTE — Assessment & Plan Note (Signed)
Blood pressure is well controlled on today's visit. No changes made to the medications. 

## 2014-08-18 NOTE — Assessment & Plan Note (Signed)
Maintaining normal sinus rhythm. No medication changes made 

## 2014-09-09 ENCOUNTER — Inpatient Hospital Stay: Payer: Self-pay | Admitting: Internal Medicine

## 2014-09-09 LAB — COMPREHENSIVE METABOLIC PANEL
ALBUMIN: 3.4 g/dL (ref 3.4–5.0)
Alkaline Phosphatase: 72 U/L
Anion Gap: 9 (ref 7–16)
BILIRUBIN TOTAL: 0.5 mg/dL (ref 0.2–1.0)
BUN: 9 mg/dL (ref 7–18)
CALCIUM: 8.8 mg/dL (ref 8.5–10.1)
CREATININE: 0.59 mg/dL — AB (ref 0.60–1.30)
Chloride: 103 mmol/L (ref 98–107)
Co2: 27 mmol/L (ref 21–32)
EGFR (African American): >60
GLUCOSE: 130 mg/dL — AB (ref 65–99)
Osmolality: 278 (ref 275–301)
POTASSIUM: 3.7 mmol/L (ref 3.5–5.1)
SGOT(AST): 30 U/L (ref 15–37)
SGPT (ALT): 38 U/L
SODIUM: 139 mmol/L (ref 136–145)
Total Protein: 7.4 g/dL (ref 6.4–8.2)

## 2014-09-09 LAB — URINALYSIS, COMPLETE
BILIRUBIN, UR: NEGATIVE
GLUCOSE, UR: NEGATIVE mg/dL (ref 0–75)
Hyaline Cast: 2
Ketone: NEGATIVE
NITRITE: NEGATIVE
Ph: 5 (ref 4.5–8.0)
SPECIFIC GRAVITY: 1.008 (ref 1.003–1.030)
Squamous Epithelial: NONE SEEN
WBC UR: 325 /HPF (ref 0–5)

## 2014-09-09 LAB — CBC WITH DIFFERENTIAL/PLATELET
BANDS NEUTROPHIL: 6 %
Eosinophil: 1 %
HCT: 42.3 % (ref 40.0–52.0)
HGB: 13.6 g/dL (ref 13.0–18.0)
LYMPHS PCT: 5 %
MCH: 29.9 pg (ref 26.0–34.0)
MCHC: 32.2 g/dL (ref 32.0–36.0)
MCV: 93 fL (ref 80–100)
Monocytes: 5 %
PLATELETS: 292 10*3/uL (ref 150–440)
RBC: 4.56 10*6/uL (ref 4.40–5.90)
RDW: 15.3 % — AB (ref 11.5–14.5)
Segmented Neutrophils: 83 %
WBC: 18.3 10*3/uL — ABNORMAL HIGH (ref 3.8–10.6)

## 2014-09-09 LAB — PROTIME-INR
INR: 2.3
Prothrombin Time: 24.4 secs — ABNORMAL HIGH (ref 11.5–14.7)

## 2014-09-09 LAB — TROPONIN I
TROPONIN-I: 0.03 ng/mL
TROPONIN-I: 0.02 ng/mL

## 2014-09-09 LAB — CK-MB
CK-MB: 1.3 ng/mL (ref 0.5–3.6)
CK-MB: 1 ng/mL (ref 0.5–3.6)

## 2014-09-09 LAB — MAGNESIUM: Magnesium: 1.6 mg/dL — ABNORMAL LOW

## 2014-09-10 LAB — CBC WITH DIFFERENTIAL/PLATELET
BASOS PCT: 0.1 %
Basophil #: 0 10*3/uL (ref 0.0–0.1)
EOS PCT: 0 %
Eosinophil #: 0 10*3/uL (ref 0.0–0.7)
HCT: 37.4 % — AB (ref 40.0–52.0)
HGB: 11.9 g/dL — AB (ref 13.0–18.0)
LYMPHS ABS: 0.7 10*3/uL — AB (ref 1.0–3.6)
Lymphocyte %: 1.8 %
MCH: 29 pg (ref 26.0–34.0)
MCHC: 31.8 g/dL — AB (ref 32.0–36.0)
MCV: 91 fL (ref 80–100)
MONO ABS: 3.3 x10 3/mm — AB (ref 0.2–1.0)
MONOS PCT: 8.4 %
Neutrophil #: 35 10*3/uL — ABNORMAL HIGH (ref 1.4–6.5)
Neutrophil %: 89.7 %
PLATELETS: 285 10*3/uL (ref 150–440)
RBC: 4.09 10*6/uL — AB (ref 4.40–5.90)
RDW: 15.8 % — ABNORMAL HIGH (ref 11.5–14.5)
WBC: 39 10*3/uL — ABNORMAL HIGH (ref 3.8–10.6)

## 2014-09-10 LAB — PROTIME-INR
INR: 2.2
Prothrombin Time: 24.1 secs — ABNORMAL HIGH (ref 11.5–14.7)

## 2014-09-10 LAB — BASIC METABOLIC PANEL
ANION GAP: 6 — AB (ref 7–16)
BUN: 11 mg/dL (ref 7–18)
CHLORIDE: 107 mmol/L (ref 98–107)
CO2: 26 mmol/L (ref 21–32)
Calcium, Total: 8 mg/dL — ABNORMAL LOW (ref 8.5–10.1)
Creatinine: 0.67 mg/dL (ref 0.60–1.30)
EGFR (African American): >60
Glucose: 115 mg/dL — ABNORMAL HIGH (ref 65–99)
Osmolality: 278 (ref 275–301)
Potassium: 3.4 mmol/L — ABNORMAL LOW (ref 3.5–5.1)
Sodium: 139 mmol/L (ref 136–145)

## 2014-09-10 LAB — TROPONIN I: Troponin-I: 0.03 ng/mL

## 2014-09-10 LAB — CK-MB: CK-MB: 0.9 ng/mL (ref 0.5–3.6)

## 2014-09-10 LAB — MAGNESIUM: MAGNESIUM: 1.9 mg/dL

## 2014-09-11 ENCOUNTER — Ambulatory Visit: Payer: Self-pay | Admitting: Internal Medicine

## 2014-09-11 LAB — CBC WITH DIFFERENTIAL/PLATELET
BASOS ABS: 0.1 10*3/uL (ref 0.0–0.1)
Basophil %: 0.3 %
EOS PCT: 0.1 %
Eosinophil #: 0 10*3/uL (ref 0.0–0.7)
HCT: 36.5 % — ABNORMAL LOW (ref 40.0–52.0)
HGB: 11.7 g/dL — ABNORMAL LOW (ref 13.0–18.0)
Lymphocyte #: 0.3 10*3/uL — ABNORMAL LOW (ref 1.0–3.6)
Lymphocyte %: 1.7 %
MCH: 29.7 pg (ref 26.0–34.0)
MCHC: 32.1 g/dL (ref 32.0–36.0)
MCV: 93 fL (ref 80–100)
MONOS PCT: 5.6 %
Monocyte #: 1.1 x10 3/mm — ABNORMAL HIGH (ref 0.2–1.0)
Neutrophil #: 18 10*3/uL — ABNORMAL HIGH (ref 1.4–6.5)
Neutrophil %: 92.3 %
PLATELETS: 230 10*3/uL (ref 150–440)
RBC: 3.94 10*6/uL — AB (ref 4.40–5.90)
RDW: 15.5 % — AB (ref 11.5–14.5)
WBC: 19.5 10*3/uL — AB (ref 3.8–10.6)

## 2014-09-11 LAB — BASIC METABOLIC PANEL
Anion Gap: 7 (ref 7–16)
BUN: 9 mg/dL (ref 7–18)
Calcium, Total: 7.8 mg/dL — ABNORMAL LOW (ref 8.5–10.1)
Chloride: 108 mmol/L — ABNORMAL HIGH (ref 98–107)
Co2: 26 mmol/L (ref 21–32)
Creatinine: 0.45 mg/dL — ABNORMAL LOW (ref 0.60–1.30)
EGFR (African American): >60
EGFR (Non-African Amer.): >60
GLUCOSE: 90 mg/dL (ref 65–99)
OSMOLALITY: 279 (ref 275–301)
Potassium: 3.4 mmol/L — ABNORMAL LOW (ref 3.5–5.1)
SODIUM: 141 mmol/L (ref 136–145)

## 2014-09-11 LAB — HEMOGLOBIN
HGB: 12.1 g/dL — ABNORMAL LOW (ref 13.0–18.0)
HGB: 12 g/dL — AB (ref 13.0–18.0)
HGB: 10.9 g/dL — ABNORMAL LOW (ref 13.0–18.0)

## 2014-09-11 LAB — HEMATOCRIT
HCT: 37.8 % — ABNORMAL LOW (ref 40.0–52.0)
HCT: 37.1 % — AB (ref 40.0–52.0)
HCT: 33.8 % — AB (ref 40.0–52.0)

## 2014-09-12 LAB — HEMOGLOBIN
HGB: 10.8 g/dL — AB (ref 13.0–18.0)
HGB: 11.6 g/dL — ABNORMAL LOW (ref 13.0–18.0)
HGB: 11.7 g/dL — ABNORMAL LOW (ref 13.0–18.0)

## 2014-09-12 LAB — HEMATOCRIT
HCT: 32.9 % — ABNORMAL LOW (ref 40.0–52.0)
HCT: 35.2 % — ABNORMAL LOW (ref 40.0–52.0)
HCT: 36.2 % — AB (ref 40.0–52.0)

## 2014-09-12 LAB — URINE CULTURE

## 2014-09-13 LAB — CBC WITH DIFFERENTIAL/PLATELET
BASOS ABS: 0.1 10*3/uL (ref 0.0–0.1)
BASOS PCT: 0.6 %
EOS ABS: 0.2 10*3/uL (ref 0.0–0.7)
Eosinophil %: 1.5 %
HCT: 33.7 % — AB (ref 40.0–52.0)
HGB: 11.1 g/dL — AB (ref 13.0–18.0)
LYMPHS PCT: 7.5 %
Lymphocyte #: 0.9 10*3/uL — ABNORMAL LOW (ref 1.0–3.6)
MCH: 29.7 pg (ref 26.0–34.0)
MCHC: 32.9 g/dL (ref 32.0–36.0)
MCV: 90 fL (ref 80–100)
Monocyte #: 1 x10 3/mm (ref 0.2–1.0)
Monocyte %: 8.8 %
Neutrophil #: 9.6 10*3/uL — ABNORMAL HIGH (ref 1.4–6.5)
Neutrophil %: 81.6 %
Platelet: 240 10*3/uL (ref 150–440)
RBC: 3.74 10*6/uL — ABNORMAL LOW (ref 4.40–5.90)
RDW: 14.7 % — ABNORMAL HIGH (ref 11.5–14.5)
WBC: 11.8 10*3/uL — AB (ref 3.8–10.6)

## 2014-09-14 LAB — CBC WITH DIFFERENTIAL/PLATELET
Bands: 3 %
Comment - H1-Com1: NORMAL
Comment - H1-Com2: NORMAL
HCT: 36.1 % — AB (ref 40.0–52.0)
HGB: 11.7 g/dL — ABNORMAL LOW (ref 13.0–18.0)
LYMPHS PCT: 7 %
MCH: 29.5 pg (ref 26.0–34.0)
MCHC: 32.4 g/dL (ref 32.0–36.0)
MCV: 91 fL (ref 80–100)
Metamyelocyte: 2 %
Monocytes: 8 %
NRBC/100 WBC: 1 /
PLATELETS: 255 10*3/uL (ref 150–440)
RBC: 3.96 10*6/uL — ABNORMAL LOW (ref 4.40–5.90)
RDW: 14.8 % — AB (ref 11.5–14.5)
SEGMENTED NEUTROPHILS: 79 %
WBC: 12.9 10*3/uL — AB (ref 3.8–10.6)

## 2014-09-14 LAB — CULTURE, BLOOD (SINGLE)

## 2014-09-15 LAB — BASIC METABOLIC PANEL
Anion Gap: 13 (ref 7–16)
BUN: 3 mg/dL — AB (ref 7–18)
Calcium, Total: 8 mg/dL — ABNORMAL LOW (ref 8.5–10.1)
Chloride: 105 mmol/L (ref 98–107)
Co2: 23 mmol/L (ref 21–32)
Creatinine: 0.3 mg/dL — ABNORMAL LOW (ref 0.60–1.30)
EGFR (African American): >60
EGFR (Non-African Amer.): >60
GLUCOSE: 71 mg/dL (ref 65–99)
OSMOLALITY: 276 (ref 275–301)
Potassium: 3.4 mmol/L — ABNORMAL LOW (ref 3.5–5.1)
Sodium: 141 mmol/L (ref 136–145)

## 2014-09-16 LAB — MAGNESIUM: MAGNESIUM: 1.8 mg/dL

## 2014-09-16 LAB — POTASSIUM: Potassium: 3.3 mmol/L — ABNORMAL LOW (ref 3.5–5.1)

## 2014-09-16 LAB — CBC WITH DIFFERENTIAL/PLATELET
BASOS ABS: 0.1 10*3/uL (ref 0.0–0.1)
Basophil %: 0.7 %
EOS ABS: 0.4 10*3/uL (ref 0.0–0.7)
Eosinophil %: 3.9 %
HCT: 36.9 % — ABNORMAL LOW (ref 40.0–52.0)
HGB: 12.1 g/dL — AB (ref 13.0–18.0)
Lymphocyte #: 1.1 10*3/uL (ref 1.0–3.6)
Lymphocyte %: 11.2 %
MCH: 29.9 pg (ref 26.0–34.0)
MCHC: 32.8 g/dL (ref 32.0–36.0)
MCV: 91 fL (ref 80–100)
MONOS PCT: 9.9 %
Monocyte #: 1 x10 3/mm (ref 0.2–1.0)
NEUTROS ABS: 7.6 10*3/uL — AB (ref 1.4–6.5)
Neutrophil %: 74.3 %
Platelet: 309 10*3/uL (ref 150–440)
RBC: 4.04 10*6/uL — ABNORMAL LOW (ref 4.40–5.90)
RDW: 14.9 % — AB (ref 11.5–14.5)
WBC: 10.2 10*3/uL (ref 3.8–10.6)

## 2014-09-16 LAB — PROTIME-INR
INR: 1.3
Prothrombin Time: 15.8 secs — ABNORMAL HIGH (ref 11.5–14.7)

## 2014-09-17 LAB — PROTIME-INR
INR: 1.3
Prothrombin Time: 15.6 secs — ABNORMAL HIGH (ref 11.5–14.7)

## 2014-09-17 LAB — BASIC METABOLIC PANEL
Anion Gap: 10 (ref 7–16)
BUN: 1 mg/dL — ABNORMAL LOW (ref 7–18)
CALCIUM: 8 mg/dL — AB (ref 8.5–10.1)
Chloride: 108 mmol/L — ABNORMAL HIGH (ref 98–107)
Co2: 25 mmol/L (ref 21–32)
Creatinine: 0.47 mg/dL — ABNORMAL LOW (ref 0.60–1.30)
EGFR (African American): >60
EGFR (Non-African Amer.): >60
Glucose: 97 mg/dL (ref 65–99)
OSMOLALITY: 281 (ref 275–301)
Potassium: 3 mmol/L — ABNORMAL LOW (ref 3.5–5.1)
Sodium: 143 mmol/L (ref 136–145)

## 2014-09-17 LAB — POTASSIUM: Potassium: 3.4 mmol/L — ABNORMAL LOW (ref 3.5–5.1)

## 2014-09-18 LAB — BASIC METABOLIC PANEL
Anion Gap: 7 (ref 7–16)
BUN: 2 mg/dL — ABNORMAL LOW (ref 7–18)
CALCIUM: 8.5 mg/dL (ref 8.5–10.1)
CHLORIDE: 109 mmol/L — AB (ref 98–107)
CO2: 26 mmol/L (ref 21–32)
CREATININE: 0.51 mg/dL — AB (ref 0.60–1.30)
EGFR (African American): >60
EGFR (Non-African Amer.): >60
Glucose: 118 mg/dL — ABNORMAL HIGH (ref 65–99)
OSMOLALITY: 280 (ref 275–301)
POTASSIUM: 3.5 mmol/L (ref 3.5–5.1)
Sodium: 142 mmol/L (ref 136–145)

## 2014-09-18 LAB — PROTIME-INR
INR: 1.4
Prothrombin Time: 17.3 secs — ABNORMAL HIGH (ref 11.5–14.7)

## 2014-10-09 ENCOUNTER — Emergency Department: Payer: Self-pay | Admitting: Emergency Medicine

## 2014-10-09 LAB — URINALYSIS, COMPLETE
Bacteria: NONE SEEN
Bilirubin,UR: NEGATIVE
Glucose,UR: NEGATIVE mg/dL (ref 0–75)
Ketone: NEGATIVE
NITRITE: POSITIVE
Ph: 8 (ref 4.5–8.0)
RBC,UR: 33 /HPF (ref 0–5)
SPECIFIC GRAVITY: 1.013 (ref 1.003–1.030)
SQUAMOUS EPITHELIAL: NONE SEEN

## 2014-10-09 LAB — COMPREHENSIVE METABOLIC PANEL
ALT: 108 U/L — AB (ref 14–63)
Albumin: 3.2 g/dL — ABNORMAL LOW (ref 3.4–5.0)
Alkaline Phosphatase: 68 U/L (ref 46–116)
Anion Gap: 7 (ref 7–16)
BILIRUBIN TOTAL: 0.4 mg/dL (ref 0.2–1.0)
BUN: 8 mg/dL (ref 7–18)
CHLORIDE: 106 mmol/L (ref 98–107)
CO2: 28 mmol/L (ref 21–32)
CREATININE: 0.53 mg/dL — AB (ref 0.60–1.30)
Calcium, Total: 8.8 mg/dL (ref 8.5–10.1)
EGFR (Non-African Amer.): >60
Glucose: 98 mg/dL (ref 65–99)
OSMOLALITY: 280 (ref 275–301)
Potassium: 3.8 mmol/L (ref 3.5–5.1)
SGOT(AST): 76 U/L — ABNORMAL HIGH (ref 15–37)
Sodium: 141 mmol/L (ref 136–145)
Total Protein: 7 g/dL (ref 6.4–8.2)

## 2014-10-09 LAB — PROTIME-INR
INR: 3
PROTHROMBIN TIME: 30.1 s — AB (ref 11.5–14.7)

## 2014-10-09 LAB — APTT: Activated PTT: 41.9 secs — ABNORMAL HIGH (ref 23.6–35.9)

## 2014-10-09 LAB — TROPONIN I

## 2014-10-09 LAB — CBC
HCT: 38.6 % — AB (ref 40.0–52.0)
HGB: 12.6 g/dL — AB (ref 13.0–18.0)
MCH: 29.5 pg (ref 26.0–34.0)
MCHC: 32.7 g/dL (ref 32.0–36.0)
MCV: 90 fL (ref 80–100)
Platelet: 271 10*3/uL (ref 150–440)
RBC: 4.28 10*6/uL — ABNORMAL LOW (ref 4.40–5.90)
RDW: 16.3 % — AB (ref 11.5–14.5)
WBC: 7 10*3/uL (ref 3.8–10.6)

## 2014-10-09 LAB — LIPASE, BLOOD: Lipase: 100 U/L (ref 73–393)

## 2014-10-11 LAB — URINE CULTURE

## 2014-10-12 ENCOUNTER — Other Ambulatory Visit: Payer: Self-pay | Admitting: Cardiovascular Disease

## 2014-10-12 ENCOUNTER — Ambulatory Visit: Payer: Self-pay | Admitting: Internal Medicine

## 2014-11-05 DIAGNOSIS — T839XXA Unspecified complication of genitourinary prosthetic device, implant and graft, initial encounter: Secondary | ICD-10-CM | POA: Insufficient documentation

## 2014-11-14 ENCOUNTER — Emergency Department: Payer: Self-pay | Admitting: Emergency Medicine

## 2014-12-06 DIAGNOSIS — T83091A Other mechanical complication of indwelling urethral catheter, initial encounter: Secondary | ICD-10-CM | POA: Insufficient documentation

## 2014-12-08 ENCOUNTER — Inpatient Hospital Stay
Admit: 2014-12-08 | Disposition: A | Discharge status: Home / Self Care | Payer: Self-pay | Attending: Internal Medicine | Admitting: Internal Medicine

## 2014-12-08 LAB — COMPREHENSIVE METABOLIC PANEL
ALBUMIN: 3.7 g/dL
ALK PHOS: 86 U/L
ALT: 72 U/L — AB
Anion Gap: 6 — ABNORMAL LOW (ref 7–16)
BUN: 11 mg/dL
Bilirubin,Total: 0.8 mg/dL
CALCIUM: 9.3 mg/dL
CHLORIDE: 105 mmol/L
CO2: 31 mmol/L
Creatinine: 0.4 mg/dL — ABNORMAL LOW
EGFR (African American): >60
EGFR (Non-African Amer.): >60
GLUCOSE: 105 mg/dL — AB
Potassium: 3.8 mmol/L
SGOT(AST): 46 U/L — ABNORMAL HIGH
Sodium: 142 mmol/L
Total Protein: 7.1 g/dL

## 2014-12-08 LAB — APTT: ACTIVATED PTT: 84.1 s — AB (ref 23.6–35.9)

## 2014-12-08 LAB — URINALYSIS, COMPLETE
Bilirubin,UR: NEGATIVE
Glucose,UR: NEGATIVE mg/dL (ref 0–75)
KETONE: NEGATIVE
NITRITE: POSITIVE
PH: 7 (ref 4.5–8.0)
Protein: NEGATIVE
RBC,UR: 11 /HPF (ref 0–5)
Specific Gravity: 1.028 (ref 1.003–1.030)
Squamous Epithelial: NONE SEEN

## 2014-12-08 LAB — PROTIME-INR
INR: 6.6 — AB
PROTHROMBIN TIME: 56.9 s — AB

## 2014-12-08 LAB — CBC
HCT: 37.6 % — AB (ref 40.0–52.0)
HGB: 12.1 g/dL — AB (ref 13.0–18.0)
MCH: 28.7 pg (ref 26.0–34.0)
MCHC: 32.3 g/dL (ref 32.0–36.0)
MCV: 89 fL (ref 80–100)
Platelet: 172 10*3/uL (ref 150–440)
RBC: 4.23 10*6/uL — ABNORMAL LOW (ref 4.40–5.90)
RDW: 15.6 % — AB (ref 11.5–14.5)
WBC: 8.9 10*3/uL (ref 3.8–10.6)

## 2014-12-08 LAB — CK TOTAL AND CKMB (NOT AT ARMC)
CK, Total: 61 U/L
CK-MB: 4.7 ng/mL

## 2014-12-08 LAB — TROPONIN I: Troponin-I: <0.03 ng/mL

## 2014-12-08 LAB — LACTIC ACID, PLASMA: LACTIC ACID, VENOUS: 1.2 mmol/L

## 2014-12-08 LAB — LIPASE, BLOOD: Lipase: 54 U/L — ABNORMAL HIGH

## 2014-12-09 LAB — CBC WITH DIFFERENTIAL/PLATELET
BANDS NEUTROPHIL: 2 %
Eosinophil: 1 %
HCT: 36.1 % — ABNORMAL LOW (ref 40.0–52.0)
HGB: 12 g/dL — ABNORMAL LOW (ref 13.0–18.0)
LYMPHS PCT: 12 %
MCH: 29.3 pg (ref 26.0–34.0)
MCHC: 33.1 g/dL (ref 32.0–36.0)
MCV: 89 fL (ref 80–100)
Monocytes: 4 %
PLATELETS: 182 10*3/uL (ref 150–440)
RBC: 4.08 10*6/uL — ABNORMAL LOW (ref 4.40–5.90)
RDW: 15.5 % — ABNORMAL HIGH (ref 11.5–14.5)
SEGMENTED NEUTROPHILS: 81 %
WBC: 10.8 10*3/uL — AB (ref 3.8–10.6)

## 2014-12-09 LAB — BASIC METABOLIC PANEL
Anion Gap: 7 (ref 7–16)
BUN: 9 mg/dL
CHLORIDE: 104 mmol/L
CREATININE: 0.43 mg/dL — AB
Calcium, Total: 9 mg/dL
Co2: 29 mmol/L
EGFR (Non-African Amer.): >60
GLUCOSE: 90 mg/dL
Potassium: 4.1 mmol/L
SODIUM: 140 mmol/L

## 2014-12-09 LAB — PROTIME-INR
INR: 3.1
Prothrombin Time: 31.8 secs — ABNORMAL HIGH

## 2014-12-09 LAB — TSH: Thyroid Stimulating Horm: 4.382 u[IU]/mL

## 2014-12-10 LAB — CBC WITH DIFFERENTIAL/PLATELET
BASOS PCT: 0.8 %
Basophil #: 0.1 10*3/uL (ref 0.0–0.1)
EOS ABS: 0.1 10*3/uL (ref 0.0–0.7)
Eosinophil %: 1.9 %
HCT: 34.9 % — ABNORMAL LOW (ref 40.0–52.0)
HGB: 11.4 g/dL — ABNORMAL LOW (ref 13.0–18.0)
LYMPHS PCT: 15.7 %
Lymphocyte #: 1.1 10*3/uL (ref 1.0–3.6)
MCH: 28.8 pg (ref 26.0–34.0)
MCHC: 32.7 g/dL (ref 32.0–36.0)
MCV: 88 fL (ref 80–100)
Monocyte #: 0.9 x10 3/mm (ref 0.2–1.0)
Monocyte %: 12.3 %
NEUTROS ABS: 4.9 10*3/uL (ref 1.4–6.5)
Neutrophil %: 69.3 %
Platelet: 190 10*3/uL (ref 150–440)
RBC: 3.96 10*6/uL — ABNORMAL LOW (ref 4.40–5.90)
RDW: 15.5 % — ABNORMAL HIGH (ref 11.5–14.5)
WBC: 7 10*3/uL (ref 3.8–10.6)

## 2014-12-10 LAB — BASIC METABOLIC PANEL
Anion Gap: 7 (ref 7–16)
BUN: 9 mg/dL
CREATININE: 0.39 mg/dL — AB
Calcium, Total: 8.8 mg/dL — ABNORMAL LOW
Chloride: 106 mmol/L
Co2: 27 mmol/L
EGFR (African American): >60
EGFR (Non-African Amer.): >60
GLUCOSE: 96 mg/dL
POTASSIUM: 3.4 mmol/L — AB
SODIUM: 140 mmol/L

## 2014-12-10 LAB — PROTIME-INR
INR: 1.8
PROTHROMBIN TIME: 21.2 s — AB

## 2014-12-11 LAB — PROTIME-INR
INR: 1.3
Prothrombin Time: 16.5 secs — ABNORMAL HIGH

## 2014-12-11 LAB — URINE CULTURE

## 2014-12-12 ENCOUNTER — Emergency Department
Admit: 2014-12-12 | Disposition: A | Discharge status: Home / Self Care | Payer: Self-pay | Admitting: Emergency Medicine

## 2014-12-12 LAB — BASIC METABOLIC PANEL
ANION GAP: 5 — AB (ref 7–16)
BUN: 11 mg/dL
CALCIUM: 8.7 mg/dL — AB
CO2: 28 mmol/L
CREATININE: 0.44 mg/dL — AB
Chloride: 105 mmol/L
EGFR (African American): >60
EGFR (Non-African Amer.): >60
GLUCOSE: 98 mg/dL
POTASSIUM: 3.6 mmol/L
Sodium: 138 mmol/L

## 2014-12-12 LAB — PROTIME-INR
INR: 1.2
Prothrombin Time: 15.5 secs — ABNORMAL HIGH

## 2014-12-13 LAB — CULTURE, BLOOD (SINGLE)

## 2014-12-26 ENCOUNTER — Other Ambulatory Visit: Payer: Self-pay | Admitting: Cardiovascular Disease

## 2014-12-29 NOTE — Discharge Summary (Signed)
PATIENT NAME:  Kent [name], Kent [name] MR#:  [id number] DATE OF BIRTH:  [birth year]  DATE OF ADMISSION:  08/05/2012 DATE OF DISCHARGE:  08/07/2012  For a detailed note, please take a look at the history and physical done on admission by Dr. Laurin Coder.   DIAGNOSES AT DISCHARGE:  1. New onset atrial fibrillation, now converted to a sinus rhythm. 2. Frontotemporal dementia. The patient currently requires total care. 3. Hyperlipidemia. 4. Hypertension. 5. Benign prostatic hypertrophy. 6. History of DVT and PE, currently on Coumadin.  7. Anxiety.   DIET: The patient is being discharged on a low sodium, low fat diet.   ACTIVITY: As tolerated.   FOLLOW-UP: 1. Follow-up with Dr. Ashok Norris in the next 1 to 2 weeks.  2. Follow-up with Dr. Ida Rogue in the next 2 to 4 weeks.   DISCHARGE MEDICATIONS:  1. Vitamin B12 1000 mcg monthly.  2. Omeprazole 40 mg daily.  3. Ambien 12.5 mg at bedtime.  4. Seroquel 50 mg at bedtime. 5. Lovastatin 40 mg daily.  6. Aricept 10 mg at bedtime. 7. Celexa 20 mg b.i.d.  8. Coumadin 7.5 mg on Sundays and 5 mg every other day. 9. Lorazepam 0.5 mg b.i.d.  10. Flomax 0.4 mg daily.  11. Mucinex as needed.  12. Clindamycin topical to be applied daily. 13. MiraLAX daily.  14. Ketoconazole 2% shampoo to be applied to affected area as needed.  15. MetroGel 1% topical to be applied daily to the face and forehead. 16. Metoprolol tartrate 25 mg b.i.d.  17. Flecainide 50 mg b.i.d.  18. Claritin 10 mg daily.   [place] COURSE: Dr. Ida Rogue from Cardiology   [place] COURSE:  1. CT scan of the chest done with contrast on admission showing no evidence of any acute pulmonary embolism, unremarkable CT chest. 2. Chest x-ray done on admission also showing hypoinflation, low-grade CHF, and atelectasis. No focal pneumonia noted.   3. KUB showing normal bowel gas pattern to be nonspecific.   BRIEF HOSPITAL  COURSE: This is a 49 year old male who presented to the hospital with altered mental status, lethargy, and noted to be tachycardic and in new onset atrial fibrillation.  1. New onset atrial fibrillation. The patient presented to the ER with heart rates in the 120's to 130's. He was given IV Cardizem, eventually put on a Cardizem drip and admitted to the Intensive Care Unit. He was maintained on a Cardizem drip for about 24 hours. A Cardiology consult was obtained. The patient became quite hypotensive and, therefore, the Cardizem drip had to be tapered. The patient was then given some amiodarone p.o. loading dose which seemed to help convert him to a regular rhythm and he remains currently in a regular rhythm. He is hemodynamically stable now as his hypotension has improved. Presently the patient is being discharged on metoprolol b.i.d. for rate control along with flecainide as mentioned. This was in agreement with Dr. Ida Rogue who will follow the patient as an outpatient. The patient also had an echocardiogram done, the results of which are still pending, but can be further followed up as an outpatient.  2. Hypotension. This was likely secondary to hypovolemia and dehydration. The patient received 1 liter of IV fluids and his hypotension resolved. There was no evidence of septic or cardiogenic shock.  3. Frontotemporal dementia. The patient has the undiagnosed condition from the age of 82 to 55 where he developed worsening decline neurologically as he was not  able to walk or communicate or take care of himself. The exact etiology of this is still unclear. The patient has been extensively worked up at multiple places including the [facility]. The patient's mom and dad take care of him and he is total care. He requires to be fed and bathed. They will continue to do that as an outpatient. The patient is on Aricept and Seroquel and he will resume those.  4. Hyperlipidemia. The patient was maintained on  lovastatin. He will resume that. 5. Benign prostatic hypertrophy: The patient was maintained on his Flomax and he will continue that. He has a chronic condom cath which he will also continue as an outpatient.  6. History of DVT and PE. The patient is already on Coumadin. His INR was 2 on the day of discharge. He likely should have a PT, INR checked on Monday. Currently he is getting Coumadin 5 mg daily except on Sundays he gets 7.5 mg.   CODE STATUS: The patient is a DO NOT INTUBATE, DO NOT RESUSCITATE. He is being discharged with home health nursing services.   TIME SPENT WITH THE DISCHARGE: 40 minutes.   ____________________________ Belia Heman. Verdell Carmine, MD [id number] D: 08/07/2012 14:41:08 ET T: 08/08/2012 12:07:14 ET JOB#: [id number]  cc: Belia Heman. Verdell Carmine, MD, <Dictator> Ashok Norris, MD Minna Merritts, MD Henreitta Leber MD ELECTRONICALLY SIGNED 08/09/2012 14:58

## 2014-12-29 NOTE — H&P (Signed)
PATIENT NAME:  Kent [name], Kent [name] MR#:  [id number] DATE OF BIRTH:  09-20-1965  DATE OF ADMISSION:  08/05/2012  PRIMARY CARE PHYSICIAN: Ashok Norris, MD   REFERRING PHYSICIAN: Graciella Freer, MD     REASON FOR ADMISSION: Elevated heart rate, elevated blood pressure, profuse sweating and not acting his normal self.   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 Apparently the patient developed this at the age of 45 and has been declining since then. For the past 15 years, he has been mostly bedridden or wheelchair-bound; and he has been living with his parents all his life and had significant decline of his activities. The patient has a been hospitalized here about a year ago due to leg infection. In the past, he has had a PE. He has history of sleep apnea, myelopathy, hypertension, depressive disorder, neurogenic bladder, obesity, chronic urinary tract infections and anemia. Today he comes with a history of acting differently. Apparently, he has been less communicative. His baseline is very minimal. He is, as I mentioned above, bedridden or wheelchair-bound. He has been needing pretty much full assistance from his parents even for basic needs like alimentation, feeding and toileting. He only says the word Elvis Presley and sometimes does some deep voice singing because he loves the choir music. When he is singing there are no words, just mumbling. Apparently he has decreased all that to the point that he is not really talking at all. For the past day, his father has noticed that he has been sweating significantly, quivering and shivering especially at the level of his stomach.  There is some movement on the upper part that seems like gas-related, as per his father's description. Today he started spitting up some clear fluid which the father says is coming from his  esophagus or abdomen, not from his lungs. Whenever he has congestion of his lungs he coughs up phlegm. He has not been coughing.  He has a stuffy nose, and he had some foam coming out of his mouth occasionally within the past hours of the morning. He has not had any fever. He has not had any recent infection. He has not had any change in his medications.   At the ER, he was evaluated and he was found to be in atrial fibrillation with RVR with a heart rate in the 140s. His blood pressure was 165/125. His oxygen saturation has been around 92 to 93 on room air, and his temperature is 97.1. The patient has been given a 15 mg bolus of diltiazem, and he has been put on a diltiazem drip. His heart rate still remains elevated in the 140s so far.   REVIEW OF SYSTEMS: Unable to obtain due to severe dementia; but as mentioned above, he has not had any fever. He has a change in his bowels. He has not had a good bowel movement for three days, as per family. There has not been any cough or upper respiratory symptoms. He has not been complaining of pain. There has not been any changes on his body habitus like edema or significant weight loss or weight gain.   PAST MEDICAL HISTORY: Obtained from records and from the family:  1. Frontotemporal dementia that developed at the age of 43 and progressive to the point that he has been bedridden since 1996.  2. Sleep apnea.  3. Hypertension.  4. Depression.  5. Neurogenic bladder.  6. Myelopathy.  7. Obesity.  8. Intervertebral disk degenerative disease.  9. Elevated LFTs.  10. History of deep vein thrombosis.  11. History of pulmonary embolus, on chronic anticoagulation.  12. Chronic urinary tract infections.  13. Pernicious anemia.  14. Rosacea.   ALLERGIES: The patient is allergic to codeine and vancomycin.  SOCIAL HISTORY: The patient lives with his family; as mentioned above, he is bedridden. He has severe dementia, and he has 1:1 assistance from her parents,  who are elderly. He does not smoke, does not drink, and never used drugs.   PAST SURGICAL HISTORY: Negative.   FAMILY HISTORY: History of hypertension, hyperlipidemia in his father. His grandfather and grandmother had significant coronary artery disease. His grandfather died from a myocardial infarction in his 22s.    MEDICATIONS:  1. Flomax 0.4 mg once daily.  2. Coumadin 6 mg once daily.  3. Lorazepam 0.5 mg every eight hours.  4. Citalopram 20 mg two times a day. 5. Lovastatin 40 mg once daily.  6. Seroquel 50 mg once daily at bedtime.  7. Famciclovir 500 mg three times a day. This is not an active medication, apparently. 8. Zolpidem 12.5 mg at bedtime.  9. Donepezil 10 mg once at bedtime. 10. Bactrim DS, has not been on it for several months.  11. Omeprazole 40 mg once daily.  12. Vitamin B12 100 mcg once a month.   PHYSICAL EXAMINATION:  VITAL SIGNS: Blood pressure 165/125, heart rate of 145, respirations 20, temperature 97.1.   GENERAL: The patient is alert, and he is cooperative.  He follows very simple commands like opening his mouth and showing me his tongue on command, but he does not move very much or seems to understand certain things that I am saying. The patient is hemodynamically stable at this moment and looks a little bit anxious.   HEENT: His pupils are equal and reactive. Extraocular movements are intact. Mucosa are dry. He has thrush on his tongue but not any other oral lesions He has some rosacea at the level of his eyebrows and around his mouth.   NECK: Supple. No JVD. No thyromegaly. No adenopathy. Trachea is central. No masses. No carotid  bruits.   CARDIOVASCULAR: Irregularly irregular with elevated heart rate in the 140s. No murmurs or rubs are heard at this moment. No displacement, no PMI. No thrills.   LUNGS: The patient has crackles in both bases. No wheezing. No rhonchi. No use of accessory muscles. No dullness to percussion.   ABDOMEN: His abdomen is  soft. It is mildly distended and tympanic. Not tender to palpation. No rebound. No guarding. No hepatosplenomegaly. Bowel sounds are positive.   GENITAL: Positive for a condom catheter that he uses chronically. There is some erythema of both groins likely due to a yeast infection. He has a little bit of breakdown of the skin of both gluteal areas. No genital lesions.   EXTREMITIES: No cyanosis, no clubbing. There is mild pitting edema at the level of the ankles but not on the pretibial areas. Pulses +2 distally. Capillary refill is less than 3.0. Bevelyn Buckles is negative both lower extremities.   NEUROLOGIC: Cranial nerves are grossly intact. Sensation is grossly intact. He does not move much of his extremities; but whenever he is actively engaged he can give some resistance, and the strength seems to be equal in four extremities.   SKIN: Without any significant rashes or petechiae, only rosacea at the level of the face around  the corners of the mouth and eyebrows.   LYMPHATICS: Negative for lymphadenopathy in neck or supraclavicular areas.    JOINTS/MUSCULOSKELETAL: Without any significant abnormalities or effusions.   LABORATORY, DIAGNOSTIC AND RADIOLOGICAL DATA: Glucose 129, BUN 6, creatinine 0.48, potassium 3.5, sodium 138, calcium 8.6, magnesium 2.0. LFTs are within normal limits. Troponin 0.02. TSH 1.61. White blood cells of 1300, hemoglobin 15,000. Platelets are 303. INR is 1.5. Chest x-ray: Mild central pulmonary congestion, likely congestive heart failure. No consolidations.  KUB: No signs of obstruction, just some nonspecific gas. EKG: Atrial fibrillation with RVR. No significant ST depression or elevation.   ASSESSMENT AND PLAN: The patient is a 49 year old gentleman with history of dementia, hypertension, bedridden. He has also history of significant depression, neurogenic bladder. In the past he had a pulmonary embolus, and a deep vein thrombosis, and anemia, which has been resolved. He comes  with increased heart rate, increased blood pressure and acting differently, and he is diagnosed with atrial fibrillation with RVR.   1. Atrial fibrillation with RVR: The patient has an elevated heart rate of 145. He has never had history of atrial fibrillation for which we have to rule out the possibility of acute events like acute coronary syndrome, which does not seem the case. At this moment his troponin is negative. There is no significant change on the EKG. He cannot give any information about chest pain, and in this type of patient could be atypical for what is going to be anticoagulated anyway. The other possibility is infectious process. He has an elevated white blood count. I am not going to start any antibiotics at this moment since the patient has been afebrile, and I do not see a specific focus of infection. He has had urinary tract infections in the past for which I am sending a urinalysis and a urine culture. There does not seem to be a respiratory problem here. The other possibility is well could be a pulmonary embolism. The patient has significant history of deep vein thrombosis and pulmonary embolism in the past for which he has been on chronic anticoagulation. At this moment his INR is subtherapeutic of 1.5 for which I am going to start him on Lovenox at 1 mg/kg twice daily and stop it whenever his INR reaches above 2.0. The patient has been taking his Coumadin, and we are going to resume the home dose of Coumadin for now. Another possibility is electrolyte abnormalities. His magnesium is 2.0, his potassium is 3.5. I am going to give him 20 mEq of potassium. He might need little bit of Lasix for his congestive heart failure for which potassium will be checked closely and followed up. The patient is going to be put on the Critical Care Unit, and he is going to have a diltiazem drip. He has been given 15 mg of both, and he is on the drip right now, and his heart rate is still elevated. If it does  not change, it does not convert, we can switch to amiodarone for renal conversion. We are going to get an echocardiogram and consult Cardiology.  2. Hypertension: Continue treatment with current medication, diltiazem, but we are going to add on some hydralazine for p.r.n. use.  3. Increased white blood cells: As mentioned above, rule out infection. No antibiotics at this moment.  4. Dyslipidemia: Continue lovastatin.  5. Constipation: We are going to do a suppository tonight and put him on stool softeners.  6. Dementia: Continue Donepezil, Seroquel; and for his depressive  disorder also citalopram, Ambien p.r.n. and Ativan.   7. Thrush: Nystatin and Diflucan. We are going to treat it for seven days and re-evaluate the need of continued treatment for 14 days. He has had LFTs elevated in the past. At this moment they are negative.  8. Other medical problems seem to be stable.   TIME SPENT: I spent about 50 minutes with this admission.   ____________________________ [place] Sink, MD rsg:cbb D: 08/05/2012 14:58:10 ET T: 08/05/2012 15:54:46 ET JOB#: [id number]  cc:  Bend Sink, MD, <Dictator> Ashok Norris, MD Cristi Loron MD ELECTRONICALLY SIGNED 08/06/2012 12:26

## 2014-12-29 NOTE — Consult Note (Signed)
Brief Consult Note: Diagnosis: AFIB/MR.   Patient was seen by consultant.   Consult note dictated.   Recommend further assessment or treatment.   Discussed with Attending MD.   Comments: Pt seen for AFIB.  Full note to follow [place].  Electronic Signatures: Lujean Amel D (MD)  (Signed [id number] 16:32)  Authored: Brief Consult Note   Last Updated: 25-Nov-13 16:32 by Yolonda Kida (MD)

## 2014-12-29 NOTE — Consult Note (Signed)
General Aspect 49 year old gentleman with severe progressive frontotemporal dementia starting at age of 21, now mostly bedridden or wheelchair-bound, who lives with his parents, history of leg infection, PE, sleep apnea, myelopathy, hypertension, depressive disorder, neurogenic bladder, obesity, chronic urinary tract infections and anemia. He presents with mental status changes. Cardiology was consulted for atrial fibrillation .  Family reports that recently he has been less communicative. His baseline is minimal communication. At baseline, he requires full assistance from his parents even for basic needs like alimentation, feeding and toileting.  For the past day, his father has noticed that he has been sweating significantly, quivering and shivering.  he started spitting up some clear fluid.  He has not been coughing.  He has a stuffy nose, and he had some foam coming out of his mouth. He has not had any fever. He has not had any recent infection. He has not had any change in his medications.   Physical Exam:   GEN well developed, noncommunicative    HEENT pink conjunctivae    NECK supple    RESP normal resp effort  clear BS    CARD Regular rate and rhythm  No murmur    ABD denies tenderness  soft    EXTR negative edema    SKIN normal to palpation    NEURO motor/sensory function intact    PSYCH alert, poor insight   Review of Systems:   ROS Pt not able to provide ROS     Right Foot Cellulitis: 13-May-2011   Paralysis: paraplegic   PE - pulmonary embolism:    DVT - Deep Vein Thrombosis:    Sleep Apnea:    metabolic syndrome:    idiopathic sclerosis:    Sleep Disorder:    hypokalemia:        Admit Diagnosis:   AFIB: 06-Aug-2012, Active, AFIB      Admit Reason:   AF (atrial fibrillation): (427.31) 05-Aug-2012, Active, ICD9, Atrial fibrillation, with RVR  Home Medications: Medication Instructions Status  cyanocobalamin 1000 mcg oral tablet 1  orally once a  month  Active  citalopram 20 mg oral tablet 1 tab(s) orally 2 times a day Active  Coumadin 7.5 mg oral tablet 1 tab(s) orally once on Sundays Active  lorazepam 0.5 mg oral tablet 1 tab(s) orally 2 times a day Active  tamsulosin 0.4 mg oral capsule 1 cap(s) orally once a day Active  Mucinex 1 tab(s) orally , As Needed for sinus drainage Active  clindamycin 1% topical solution Apply topically to affected area once a day Active  MiraLax oral powder for reconstitution 1 dose(s) orally , As Needed- for Constipation  Active  ketoconazole 2% topical shampoo Apply topically to affected area , As Needed Active  MetroGel 1% topical gel Apply topically to affected area once a day to face and forehead Active  omeprazole 40 mg oral delayed release capsule 1 cap(s) orally once a day Active  Coumadin 6 mg oral tablet 1 tab(s) orally once a day Active  zolpidem 12.5 mg oral tablet, extended release 1 tab(s) orally once a day (at bedtime) Active  Seroquel 50 mg oral tablet 3 tab(s) orally once a day (at bedtime) Active  lovastatin 40 mg oral tablet 1 tab(s) orally once a day Active  donepezil 10 mg oral tablet 1 tab(s) orally once a day (at bedtime) Active   Lab Results:  Hepatic:  26-Nov-13 04:30    Bilirubin, Total 0.7   Alkaline Phosphatase 76   SGPT (ALT) 32  SGOT (AST) 24   Total Protein, Serum 7.2   Albumin, Serum 3.7  Routine Chem:  26-Nov-13 04:30    Glucose, Serum  110   BUN 7   Creatinine (comp) 0.71   Sodium, Serum 141   Potassium, Serum  3.4   Chloride, Serum 104   CO2, Serum 28   Calcium (Total), Serum 8.9   Osmolality (calc) 280   eGFR (African American) >60   eGFR (Non-African American) >60 (eGFR values <66mL/min/1.73 m2 may be an indication of chronic kidney disease (CKD). Calculated eGFR is useful in patients with stable renal function. The eGFR calculation will not be reliable in acutely ill patients when serum creatinine is changing rapidly. It is not useful in  patients  on dialysis. The eGFR calculation may not be applicable to patients at the low and high extremes of body sizes, pregnant women, and vegetarians.)   Anion Gap 9   Magnesium, Serum 2.2 (1.8-2.4 THERAPEUTIC RANGE: 4-7 mg/dL TOXIC: > 10 mg/dL  -----------------------)   Hemoglobin A1c ([facility]) 5.3 (The American Diabetes Association recommends that a primary goal of therapy should be <7% and that physicians should reevaluate the treatment regimen in patients with HbA1c values consistently >8%.)   Cholesterol, Serum 189   Triglycerides, Serum 125   HDL ([facility]) 49   VLDL Cholesterol Calculated 25   LDL Cholesterol Calculated  115 (Result(s) reported on 06 Aug 2012 at 05:35AM.)  Routine Coag:  26-Nov-13 04:30    Prothrombin  20.7   INR 1.7 (INR reference interval applies to patients on anticoagulant therapy. A single INR therapeutic range for coumarins is not optimal for all indications; however, the suggested range for most indications is 2.0 - 3.0. Exceptions to the INR Reference Range may include: Prosthetic heart valves, acute myocardial infarction, prevention of myocardial infarction, and combinations of aspirin and anticoagulant. The need for a higher or lower target INR must be assessed individually. Reference: The Pharmacology and Management of the Vitamin K  antagonists: the seventh ACCP Conference on Antithrombotic and Thrombolytic Therapy. TUUEK.8003 Sept:126 (3suppl): [id number]. A HCT value >55% may artifactually increase the PT.  In one study,  the increase was an average of 25%. Reference:  "Effect on Routine and Special Coagulation Testing Values of Citrate Anticoagulant Adjustment in Patients with High HCT Values." American Journal of Clinical Pathology 2006;126:400-405.)  Routine Hem:  26-Nov-13 04:30    WBC (CBC)  11.5   RBC (CBC) 4.94   Hemoglobin (CBC) 15.1   Hematocrit (CBC) 44.6   Platelet Count (CBC) 336   MCV 90   MCH 30.6   MCHC 33.9   RDW 13.9    Neutrophil % 69.0   Lymphocyte % 19.9   Monocyte % 9.6   Eosinophil % 0.9   Basophil % 0.6   Neutrophil #  7.9   Lymphocyte # 2.3   Monocyte #  1.1   Eosinophil # 0.1   Basophil # 0.1 (Result(s) reported on 06 Aug 2012 at 05:37AM.)   EKG:   Interpretation EKG shows atrial fibrillation, rate 141, nonspecific ST ABN    Codeine: Unknown  vancomycin: Rash  Vital Signs/Nurse's Notes: **Vital Signs.:   26-Nov-13 12:30   Pulse Pulse 96   Respirations Respirations 26   Systolic BP Systolic BP 89   Diastolic BP (mmHg) Diastolic BP (mmHg) 50   Mean BP 63   Pulse Ox % Pulse Ox % 90   Oxygen Delivery 3L   Pulse Ox Heart Rate 92  Impression 49 year old gentleman with severe progressive frontotemporal dementia starting at age of 76, now mostly bedridden or wheelchair-bound, who lives with his parents, history of leg infection, PE, sleep apnea, myelopathy, hypertension, depressive disorder, neurogenic bladder, obesity, chronic urinary tract infections and anemia. He presents with mental status changes. Cardiology was consulted for atrial fibrillation .  1) atrial fibrillation Uncertain how long he has been in atrial fibrillation INR is subtherapeutic Lovenox bridge has been given while warfarin dosing is being adjusted, goal INR >2 He did convert to normal sinus rhythm this morning Blood pressure is low limiting medication options. Diltiazem on hold, SBP in 80s ---Will start amiodarone BID, hold the diltiazem/metoprolol as BP low ---Would continue warfarin with INR goal greater than 2  2) hypotension Uncertain if this is his baseline. He is certainly high risk of autonomic dysfunction given he is essentially bed bound IVF at 150 ml x 5 hours --Could consider midodrine 5 mg 3 times a day if no improvement with IVF --Renal function does not suggest he is prerenal/dehydration.   3) dementia Poor baseline  essentially bedbound or wheelchair-bound. Aspiration risk   Electronic  Signatures: Ida Rogue (MD)  (Signed [phone] 14:21)  Authored: General Aspect/Present Illness, History and Physical Exam, Review of System, Past Medical History, Health Issues, Home Medications, Labs, EKG , Allergies, Vital Signs/Nurse's Notes, Impression/Plan   Last Updated: 26-Nov-13 14:21 by Ida Rogue (MD)

## 2015-01-01 ENCOUNTER — Emergency Department
Admit: 2015-01-01 | Disposition: A | Discharge status: Home / Self Care | Payer: Self-pay | Admitting: Emergency Medicine

## 2015-01-01 LAB — CBC
HCT: 35.1 % — ABNORMAL LOW (ref 40.0–52.0)
HGB: 11.6 g/dL — AB (ref 13.0–18.0)
MCH: 29.4 pg (ref 26.0–34.0)
MCHC: 33.2 g/dL (ref 32.0–36.0)
MCV: 88 fL (ref 80–100)
PLATELETS: 332 10*3/uL (ref 150–440)
RBC: 3.96 10*6/uL — AB (ref 4.40–5.90)
RDW: 15.7 % — ABNORMAL HIGH (ref 11.5–14.5)
WBC: 7.4 10*3/uL (ref 3.8–10.6)

## 2015-01-01 LAB — URINALYSIS, COMPLETE
Bilirubin,UR: NEGATIVE
Blood: NEGATIVE
Glucose,UR: NEGATIVE mg/dL (ref 0–75)
KETONE: NEGATIVE
Nitrite: POSITIVE
PH: 6 (ref 4.5–8.0)
SPECIFIC GRAVITY: 1.015 (ref 1.003–1.030)

## 2015-01-01 LAB — COMPREHENSIVE METABOLIC PANEL
ALK PHOS: 73 U/L
ALT: 40 U/L
Albumin: 3.5 g/dL
Anion Gap: 4 — ABNORMAL LOW (ref 7–16)
BILIRUBIN TOTAL: 0.5 mg/dL
BUN: 11 mg/dL
CHLORIDE: 106 mmol/L
CREATININE: 0.37 mg/dL — AB
Calcium, Total: 8.6 mg/dL — ABNORMAL LOW
Co2: 28 mmol/L
EGFR (African American): >60
Glucose: 100 mg/dL — ABNORMAL HIGH
Potassium: 3.7 mmol/L
SGOT(AST): 31 U/L
Sodium: 138 mmol/L
TOTAL PROTEIN: 6.8 g/dL

## 2015-01-01 LAB — PROTIME-INR
INR: 1.6
PROTHROMBIN TIME: 19.2 s — AB

## 2015-01-02 NOTE — Discharge Summary (Signed)
PATIENT NAME:  Kent [name], BURNLEY MR#:  [id number] DATE OF BIRTH:  June 11, 1966  DATE OF ADMISSION:  04/12/2014 DATE OF DISCHARGE:  04/17/2014  PRIMARY CARE PHYSICIAN: Dr. Rutherford Nail  CHIEF COMPLAINT: At time of admission: Altered mental status.  ADMITTING DIAGNOSES: 1. Altered mental status.  2. Acute cystitis.   DISCHARGE DIAGNOSES: 1. Altered mental status, resolved and the patient is back to baseline.  2. Acute cystitis, citrobacter and pseudomonas.  SECONDARY DISCHARGE DIAGNOSES:  1. Chronic atrial fibrillation. Resume Coumadin 5 mg tonight and check PT/INR on August 8th. Results need to be faxed to Dr. Rutherford Nail, primary care physician for further Coumadin  management.  2. History of frontotemporal dementia. Continue Aricept, Seroquel, Celexa and Ativan.    CONSULTATIONS: None.  PROCEDURES: None.   BRIEF HISTORY AND HOSPITAL COURSE:  1. The patient is a 49 year old young male who came to the ED brought by his father due to worsening of mental status. The patient has frontotemporal dementia and  is bedbound at baseline and the patient is also nonverbal. Please review history and physical for details. The patient was diagnosed with acute cystitis, which could be the culprit for altered mental status and he was admitted to the hospital. The patient was initially started on IV ceftriaxone and cultures were followed. The patient has an indwelling Foley catheter placed by Dr. Jacqlyn Larsen, which was replaced with a new one. Eventually, the patient's urine culture was positive for citrobacter and pseudomonas; both were sensitive to levofloxacin. The patient was changed to IV levofloxacin. Subsequently, the patient became afebrile for 24 hours. The decision was made to discharge the patient home with p.o. levofloxacin, but the patient had a bowel movement with some amount of blood, regarding which her Coumadin was held and discharge was also suspended.   2. Hemoglobin was monitored, which is stable. Today's  INR is at 1.6. Father is reporting that the patient has a history of hemorrhoids and sometimes he notices some blood in his stool whenever he strains.  3. The patient had left-sided pinkeye, which was assumed to be from straining which could have caused minor conjunctival vascular rupture. Artificial tears were prescribed regarding that.  4. His mentation is back to baseline. The plan is to resume his Coumadin 5 mg tonight. The Coumadin needs to be monitored closely, as the patient is on levofloxacin, which could cause PT-INR to be elevated.  5. The patient is to continue CPAP at bedtime. INR needs to be checked by home health on August 8th.   LABORATORY FINDINGS: Blood cultures, 1 bottle is negative with no growth. Other bottle has revealed coagulase-negative Staphylococcus, which could be a contaminant. Urine culture has revealed citrobacter and Pseudomonas, which are both are sensitive to levofloxacin. BMP is normal. Hemoglobin at 12.5, hematocrit is 38.5, and white count is at 10.8, platelets are 288.   CONDITION AT THE TIME OF DISCHARGE: Back to baseline.  DISCHARGE MEDICATIONS: Omeprazole 40 mg 1 capsule p.o. once daily, donepezil 10 mg 1 tablet p.o. once daily at bedtime, citalopram 20 mg 1 tablet p.o. 2 times a day, lorazepam 0.5 mg 1 tablet p.o. 2 times a day, tamsulosin 0.4 mg 1 capsule p.o. once daily, Claritin 10 mg 1 tablet p.o. once daily, albuterol inhalation 2 times a day as needed for shortness of breath, Tessalon 100 mg 1 capsule p.o. every 8 hours as needed for cough, clindamycin topically apply to the affected area 3 times a day, cyanocobalamin 1000 mcg/mL, 0.5 mL once a month which was  given during the hospital course, lovastatin 40 mg p.o. at bedtime, Mucinex 1 tablet p.o. once a day in the evening, quetiapine 50 mg 3 tablets p.o. once a day at bedtime, metoprolol tartrate 25 mg 1/2 tablet p.o. 2 times a day, flecainide 50 mg 1 tablet p.o. b.i.d., Coumadin 5 mg p.o. at bedtime, start  tonight. Levofloxacin 750 mg 1 tablet p.o. q.24 hours, Clear Eye preserved  ophthalmic drops, 1 drop to the affected eye 2 times a day for approximately 7 to 10 days.  Home with home PT. Repeat PT-INR on  8/8 fax results to PCP.  DIET: Low-fat, low-cholesterol.   ACTIVITY: He is bedbound.   FOLLOWUP APPOINTMENTS: In 1 week, with PCP, in 1 to 2 weeks with Dr. Jacqlyn Larsen, urologist, [facility] as schedule on May 13, 2104.   Plan of care was discussed with father at bedside. He verbalized understanding of the plan.   TOTAL TIME SPENT ON DISCHARGE: 45 minutes.     ____________________________ Nicholes Mango, MD ag:lm D: 04/17/2014 14:49:51 ET T: 04/18/2014 01:40:05 ET JOB#: [id number]  cc: Nicholes Mango, MD, <Dictator> Ashok Norris, MD Nicholes Mango MD ELECTRONICALLY SIGNED 04/29/2014 8:15

## 2015-01-02 NOTE — H&P (Signed)
PATIENT NAME:  Kent [name], ERRICO MR#:  [id number] DATE OF BIRTH:  1966-06-08  DATE OF ADMISSION:  04/12/2014  PRIMARY CARE PHYSICIAN: Dr. Ashok Norris.  CHIEF COMPLAINT: Altered mental status.   HISTORY OF PRESENT ILLNESS: The patient is a 49 year old male who presents to the Emergency Room, brought in by his father due to worsening mental status. The patient at baseline has a frontotemporal dementia and is currently nonverbal with some moaning at times. According to his father at church today the patient was morning more than usual and he thought that his son was in some discomfort and pain as he normally does not act this way. The patient was brought to the Emergency Room. The patient underwent an extensive workup which showed leukocytosis and a positive urinalysis consistent with a urinary tract infection, although the patient was afebrile. There are no other associated symptoms presently. Hospitalist services were contacted for further treatment and evaluation.   REVIEW OF SYSTEMS: Unobtainable given the fact that the patient had significant underlying dementia and is nonverbal at baseline.   PAST MEDICAL HISTORY: Consistent with chronic atrial fibrillation, frontotemporal dementia, history of BPH, history of DVT, PE, GERD, and hypertension.   ALLERGIES: VANCOMYCIN AND CODEINE.   SOCIAL HISTORY: No smoking, no alcohol abuse. No illicit drug abuse. Lives at home with his parents, has around-the-clock care.   FAMILY HISTORY: Mother and father are both alive. Mother is healthy. Father has a history of prostate cancer and osteoarthritis.   CURRENT MEDICATIONS: Albuterol inhaler b.i.d. as needed, Celexa 20 mg b.i.d., Claritin 10 mg daily, clindamycin 1% topical t.i.d.; vitamin B12,1000 mcg monthly; Aricept 10 mg daily, flecainide 50 mg b.i.d., Ativan 0.5 mg b.i.d., lovastatin 40 mg at bedtime, metoprolol tartrate 12.5 mg b.i.d., Mucinex 600 mg daily, omeprazole 40 mg daily, Seroquel 50 mg 3  tablets (150 mg) at bedtime, Flomax 0.4 mg daily, Tessalon Perles every 8 hours as needed and warfarin 5 mg daily.   PHYSICAL EXAMINATION:  VITAL SIGNS: Temperature 99.6, pulse 78, respirations 20, blood pressure 130/84, oxygen saturation 96% on room air.  GENERAL: The patient is a pleasant-appearing male but in no apparent distress.  HEAD, EYES, EARS, NOSE AND THROAT: Atraumatic, normocephalic. Extraocular muscles are intact. Pupils react to light. Sclerae anicteric. No conjunctival injection. No pharyngeal erythema. He has dry oral mucosa.  NECK: Supple. There is no jugular venous distention. No bruits. No lymphadenopathy or thyromegaly.  HEART: Regular rate and rhythm. No murmurs, no rubs, no clicks.  LUNGS: Clear to auscultation anteriorly. No rales. No rhonchi. No wheezes.  ABDOMEN: Soft, flat, nontender, nondistended. Has good bowel sounds. No hepatosplenomegaly appreciated.  EXTREMITIES: No evidence of any cyanosis, clubbing, or peripheral edema. Has +2 pedal and radial pulses bilaterally.  NEUROLOGICAL: The patient is alert, awake, and oriented x1. Globally weak. Difficult to do a full neurological exam. Does not follow any commands.  SKIN: Moist and warm with no rashes.  LYMPHATIC: There is no cervical or axillary lymphadenopathy.   LABORATORY DATA: Serum glucose 112, BUN 7, creatinine 0.6, sodium 139, potassium 3.7, chloride 103, bicarbonate 29. LFTs within normal limits. Troponin less than 0.02. White cell count 20.9, hemoglobin 14.3, hematocrit 43.9, platelet count 288,000. INR is 2.7.   Urinalysis shows positive nitrites and 3+ leukocyte esterase with 700 white cells and 1+ bacteria.   The patient did have a CT of the head done without contrast, which showed no acute intracranial process. The patient also had a chest x-ray done which showed no evidence  of acute cardiopulmonary disease.   ASSESSMENT AND PLAN: This is a 49 year old male with history of frontotemporal dementia, who is  bedbound under total care, history of chronic atrial fibrillation, benign prostatic hypertrophy, history of deep vein thrombosis, pulmonary embolism, gastroesophageal reflux disease, hypertension, who presented to the hospital do to not feeling well and worsening mental status and noted to have a urinary tract infection with leukocytosis.  1.  Altered mental status. The patient has underlying frontotemporal dementia and is nonverbal at baseline but as per his father he has been moaning more than usual. Likely source of this is metabolic encephalopathy from the urinary tract infection. I will treat the patient with IV antibiotics for the urinary tract infection, follow his mental status.  CT head is negative. There is no other focal neurologic source to explain his mental status.  2.  Urinary tract infection. I will treat the patient with IV ceftriaxone, follow urine cultures. The patient has a chronic indwelling Foley. I will have them change the Foley catheter out. I did discuss the option of possibly getting a suprapubic catheter for the patient. His father says that he has an appointment with Dr. Jacqlyn Larsen regarding that.  3.  Benign prostatic hypertrophy. Continue Flomax.  4.  History of deep vein thrombosis, pulmonary embolism.  The patient is on Coumadin. INR is therapeutic.  5.  Gastroesophageal reflux disease. Continue Protonix.  6.  History of chronic atrial fibrillation. The patient is currently rate controlled. Continue with his flecainide and metoprolol. He is on Coumadin, his INR is therapeutic.  7.  History of frontotemporal dementia. Continue with his Aricept, Seroquel, Celexa and Ativan.   CODE STATUS: The patient is a full code.   TIME SPENT: 45 minutes.   ____________________________ Belia Heman. Verdell Carmine, MD vjs:lt D: 04/12/2014 15:44:05 ET T: 04/12/2014 15:52:06 ET JOB#: [id number]  cc: Belia Heman. Verdell Carmine, MD, <Dictator> Henreitta Leber MD ELECTRONICALLY SIGNED 04/24/2014 14:28

## 2015-01-02 NOTE — H&P (Signed)
PATIENT NAME:  Kent [name], [name] MR#:  [id number] DATE OF BIRTH:  Apr 23, 1966  DATE OF ADMISSION:  09/09/2014  REFERRING EMERGENCY ROOM PHYSICIAN: Lennette Bihari A. Paduchowski, MD   PRIMARY CARE PHYSICIAN: Ashok Norris, MD with [facility].  PRIMARY NEUROLOGIST: At [facility].  UROLOGISTDenice Bors. Jacqlyn Larsen, MD  CARDIOLOGIST: Minna Merritts, MD   CHIEF COMPLAINT: Altered mental status.   HISTORY OF PRESENT ILLNESS: This 49 year old man with an undiagnosed degenerative condition, which manifested when he was 49 years old who is bedbound with advanced dementia, presents today with his parents who report altered mental status. The history is provided by his mother, as the patient cannot provide a history. She reports that he was in his normal state of health yesterday. This morning he began crying out as if in discomfort. They were unable to calm him down. He has not had any nausea, vomiting or diarrhea. His appetite has been normal. On presentation to the Emergency Room, his temperature was 101.4, heart rate 135. White blood cell count elevated at 18. He is being admitted for sepsis due to a urinary tract infection.   PAST MEDICAL HISTORY:  1.  Degenerative condition, which manifested at age 48. The mother reports he has had thorough evaluation including at the [facility] and he has not received a diagnosis.  2.  History of DVT and PE.  3.  History of atrial fibrillation, on Coumadin.  4.  History of B12 deficiency.  5.  Neurogenic bladder.   PAST SURGICAL HISTORY: Placement of suprapubic catheter in September 2015.   SOCIAL HISTORY: The patient lives with his parents. He does have a personal care service. He is immobile and the home is equipped with a ceiling lift, Whirlpool tub, other assistive equipment. The patient performs none of his own ADLs.   FAMILY MEDICAL HISTORY: Both sets of grandparents had coronary artery disease and hyperlipidemia. His father had prostate cancer, which is now in  remission. No other degenerative conditions in the family.   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   HOME MEDICATIONS:  1.  Warfarin 5 mg 1 tablet once a day.  2.  Saline nasal mist 1 spray twice a day.  3.  Quetiapine 50 mg 3 tablets at bedtime.  4.  Polyethylene glycol 17 grams once a day as needed for constipation.  5.  Oxybutynin 5 mg 1 tablet 3 times a day.  6.  Omeprazole 40 mg 1 tablet once a day.  7.  Nystop 100,000 units/g topical powder, apply twice a day.  8.  Mucinex 600 mg 1 tablet every 12 hours.  9.  Metoprolol 12.5 mg orally twice a day.  10.  Lovastatin 40 mg 1 tablet once a day.  11.  Lorazepam 0.5 mg 1 tablet once a day.  12.  Loratadine 10 mg 1 tablet once a day.  13.  Ketoconazole 2% topical cream, apply in a 1:1 mixture with desonide cream once a day at bedtime.  14.  Fluconazole 100 mg 1 tablet once a day for 7 days, course completed.  15.  Flecainide 50 mg twice a day.  16.  Fish oil 1000 mg 1 capsule once a day.  17.  Donepezil 10 mg 1 tablet once a day at bedtime.  18.  Desitin applied to affected area once a day at bedtime.  19.  Cyanocobalamin 1000 mcg/mL, 1 mL injectable once a month.  20.  Clindamycin topical, 1% topical solution,  apply to affected area twice a day.  21.  Citalopram 20 mg 1 tablet once a day.  22.  Benzonatate 100 mg 1 capsule 3 times a day.  23.  Acetaminophen/hydrocodone 325/5 mg 1 to 2 tablets every 6 hours as needed for pain.   REVIEW OF SYSTEMS: Unable to obtain review of systems, as the patient does not speak.   PHYSICAL EXAMINATION:  VITAL SIGNS: Temperature 98.6, pulse 103, respirations 22, blood pressure 120/84, oxygenation 96% on room air.  GENERAL: The patient is acutely uncomfortable. He is moaning, having muscle fasciculations throughout.  HEENT: Pupils are equal, round and reactive to light. Conjunctivae are clear. Extraocular motion is intact, but not purposeful. Oral  mucous membranes are pink and moist with good dentition, posterior oropharynx is clear with no exudate, edema or erythema.  NECK: Supple. No cervical lymphadenopathy.  RESPIRATORY: Lungs are clear to auscultation on anterior examination, no respiratory distress.  CARDIOVASCULAR: Tachycardic, regular. No murmurs, rubs or gallops. No peripheral edema. Peripheral pulses are 2+.  ABDOMEN: Distended, firm, there seems to be some discomfort with deep palpation, particularly in both lower quadrants. Bowel sounds are diminished.  MUSCULOSKELETAL: There are no joint effusions. Passive range of motion is normal. There are muscle fasciculations in the neck and lateral abdominal muscles. No purposeful motion. The patient is not moving his upper extremities and does not participate in this examination.  SKIN: No obvious rash, his face is flushed, no open wounds or lesions. There is no erythema or edema surrounding the suprapubic catheter's exit site. There is some greenish-yellowish exudate at the site. NEUROLOGIC: The patient is unable to perform the basic neurologic examination.  PSYCHIATRIC: The patient does not speak. He seems agitated and uncomfortable at this time.   LABORATORY DATA: Sodium 139, potassium 3.7, chloride 103, bicarbonate 27, BUN 9, creatinine 0.59, glucose 130, magnesium 1.6. LFTs are normal. Troponin is less than 0.02. White blood cell count is 18.3, hemoglobin 13.6, platelets 292,000, MCV 93. INR is 2.3. UA is positive for leukocyte esterase, 3+ and white blood cell clumps, 325 white blood cells per high-powered field with 37 red blood cells. Lactic acid is 3.2.   IMAGING:  1.  Chest x-ray shows low volumes with bibasilar atelectasis and mild cardiomegaly.  2.  KUB shows moderate diffuse gaseous distention of the bowel, particularly at the stomach and large bowel, findings presumably representing an ileus.   ASSESSMENT AND PLAN:  1.  Sepsis: The patient meets sepsis criteria with  presenting temperature, tachycardia and elevated white blood cell count. Urine and blood cultures are pending. He does have a positive urinalysis with an indwelling suprapubic catheter and is at high risk for infection from this. Empiric Vancomycin and Zosyn have been started. He also has a questionable ileus on x-ray, but there is no history of vomiting. We will continue with broad-spectrum empiric antibiotics and tailor antibiotic therapy to culture results.  2.  Ileus: We will get a CT to be sure that there is no specific obstruction. We will place an NG tube. Electrolytes are normal. We will consult surgery if there is an obstruction.  3.  Urinary tract infection: We will change out the indwelling suprapubic catheter. Urine culture is pending. We will go ahead and treat empirically with vancomycin and Zosyn.  4.  Hypomagnesemia. We will replace and recheck in the morning.  5.  Non specific T wave changes: Initial troponin is negative. We will go ahead and monitor on telemetry. Will cycle cardiac enzymes.  6.  Atrial fibrillation: Possible paroxysmal a-fib per the father. The patient is on Coumadin and is therapeutic. We will get pharmacy consultation for Coumadin dosing. Continue metoprolol. I think that his current tachycardia is more likely due to sepsis. EKG is sinus tachycardia. We will continue with volume replacement.   CODE STATUS: The patient is a DNR. This was discussed with his parents at the time of admission.  TIME SPENT ON ADMISSION: Fifty-five minutes.    ____________________________ Earleen Newport. Volanda Napoleon, MD cpw:TT D: 09/09/2014 05:69:79 ET T: 09/09/2014 15:50:20 ET JOB#: [id number]  cc: Barnetta Chapel P. Volanda Napoleon, MD, <Dictator> Aldean Jewett MD ELECTRONICALLY SIGNED 09/09/2014 22:03

## 2015-01-04 LAB — URINE CULTURE

## 2015-01-06 NOTE — Consult Note (Signed)
Brief Consult Note: Diagnosis: possible upper GI bleeding, ileus/colonic ileus, gastric distension.   Patient was seen by consultant.   Consult note dictated.   Recommend further assessment or treatment.   Orders entered.   Discussed with Attending MD.   Comments: Patietn seen and examined, full consutl dictated [phone].  [place] admitted with probable urosepsis in setting of recent suprapubic catheter placement.  Found with ileus, apparent multiorgan distension-stomach, small bowel, colon. Likely multifactorial-infection, chronic debilitation, medications.  Recommend-avoid narcotics as possible, continue iv ppi as you are (should be on po prophylaxis with ppi at home), h pylori serology, no nsaids, serial hgb, hold coumadin for now, intermittant ng suction/cycled, daily abd film. Following. Dr Rayann Heman to see over the weekend.  Recommend palliative care consult for patietn parents.  Of note BUN this am normal, HGB stable from admission.  Electronic Signatures: Loistine Simas (MD)  (Signed 01-Jan-16 14:00)  Authored: Brief Consult Note   Last Updated: 01-Jan-16 14:00 by Loistine Simas (MD)

## 2015-01-06 NOTE — Consult Note (Signed)
PATIENT NAME:  Kent [name], Kent [name] MR#:  [id number] DATE OF BIRTH:  [birth year]  DATE OF CONSULTATION:  09/11/2014  REFERRING PHYSICIAN:   CONSULTING PHYSICIAN:  Kent Oyama A. Bijan Ridgley, MD  REASON FOR CONSULTATION: Questionable ileus.   HISTORY OF PRESENT ILLNESS: Kent [name] is a pleasant 49 year old male with a degenerative condition with advanced dementia who is bedbound. His mother, who is the historian, said he has been crying out in discomfort, but otherwise no nausea, vomiting, or diarrhea. No dysuria or hematuria. However, he has been diagnosed with urinary tract infection, was noted to have an elevated white cell count, fevers and tachycardia at the time of admission. CT scan shows no obvious findings, but does have a dilated distal colon that is not significant.  REVIEW OF SYSTEMS: Unable to assess review of systems as nobody is in the room currently and the patient is noncommunicative.   PAST MEDICAL HISTORY: 1.  Degenerative condition with advanced dementia.  2.  DVT and PE.  3.  A-fib, on Coumadin.  4.  B12 deficiency.  5.  Neurogenic bladder.  6.  Suprapubic catheter in 2015.   HOME MEDICATIONS: Warfarin, saline, Seroquel, MiraLax, oxybutynin, propranolol, Mucinex, metoprolol, lovastatin, lorazepam, loratadine and ketoconazole.  SOCIAL HISTORY: Lives with parents, is immobile, performs none of his ADLs. No tobacco or alcohol use.   FAMILY HISTORY: Coronary artery disease, hyperlipidemia, prostate cancer in family.   ALLERGIES: CODEINE AND VANCOMYCIN WITH RED MAN SYNDROME.   PHYSICAL EXAMINATION: VITAL SIGNS: Temperature 99.5, pulse 102, blood pressure 133/74, respirations 97.  GENERAL: No acute distress.  HEAD: Normocephalic, atraumatic.  EYES: No scleral icterus. No conjunctivitis.  FACE: No obvious facial trauma. Normal external nose. Normal external ears.  CHEST: Lungs clear to auscultation. Moving air well.  HEART: Regular rate and rhythm. No murmurs, rubs, or  gallops.  ABDOMEN: Soft, mildly distended. Unable to assess tenderness.  EXTREMITIES: Moves all extremities spontaneously.  NEUROLOGIC: Unable to assess cranial nerves, appears to have sensation intact.  DIAGNOSTIC DATA: White cell count 19.5, down from 39 yesterday. Bicarbonate 26, potassium 3.4.   CT scan shows no obvious signs of obstruction. Does have mildly dilated large bowel.   ASSESSMENT AND PLAN: Kent [name] a pleasant 48 year old who presents with being treated for urinary sepsis, but does have a suprapubic catheter, so is able to drain. Does have distended belly, but no obvious signs of surgical needs. No acute surgical issues.   ____________________________ Kent [name] Kent Kerwin, MD cal:sb D: 09/11/2014 07:41:20 ET T: 09/11/2014 07:55:17 ET JOB#: [id number]  cc: Kent Gave A. Jilene Spohr, MD, <Dictator> Floyde Parkins MD ELECTRONICALLY SIGNED 09/22/2014 19:54

## 2015-01-06 NOTE — Consult Note (Signed)
PATIENT NAME:  Kent [name], Kent [name] MR#:  [id number] DATE OF BIRTH:  28-Sep-1965  DATE OF CONSULTATION:  09/12/2014  REFERRING PHYSICIAN:  Nicholes Mango, MD CONSULTING PHYSICIAN:  Cheral Marker. Ola Spurr, MD  REASON FOR CONSULTATION: Polymicrobial urinary tract infection with suprapubic catheter.   HISTORY OF PRESENT ILLNESS: This is a 49 year old gentleman who lives at home with his parents, has a neurodegenerative disorder. He has a suprapubic catheter. He was brought in December 30 with increase in altered mental status and began crying out in discomfort. He was not having any nausea or vomiting. He was found to have a temperature of 101.4 and tachycardic to 135. His white count was also elevated. The patient was admitted. He appears to have an ileus, but also a urinary tract infection with urine from the suprapubic catheter growing Staphylococcus aureus, pseudomonas, and enterococcus. Urinalysis had shown 325 white cells. The patient has been treated with vancomycin and Zosyn. Clinically he is improving, although he has an NG tube in now.   PAST MEDICAL HISTORY: 1.  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  2.  DVT and PE.  3.  Atrial fibrillation.  4.  B12 deficiency.   5.  Neurogenic bladder with placement of a suprapubic catheter.   PAST SURGICAL HISTORY: Suprapubic catheter placement September 2015.   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 Does not smoke or drink.   FAMILY HISTORY: Noncontributory.   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   REVIEW OF SYSTEMS: Unable to be obtained.   ANTIBIOTICS SINCE ADMISSION: Include Zosyn begun December 29, vancomycin begun December 29. He is also on warfarin.   PHYSICAL EXAMINATION: VITAL SIGNS: Temperature 99.5, pulse 78, blood pressure 142/85, respirations 20, sat 93% on 1.5 liters.  GENERAL: He is obese, lying in bed.  HEENT: His eyes are open but he is minimally responsive. His pupils are reactive.  Oropharynx clear.  NECK: Supple.  HEART: Regular.  LUNGS: Clear.  ABDOMEN: Somewhat distended, mildly tympanic.  EXTREMITIES: No clubbing, cyanosis or edema.  GENITOURINARY: He has a suprapubic catheter in place which has no redness, tenderness, or drainage.   DATA: Urine culture from December 30 grew 100,000 Staphylococcus aureus, 100,000 pseudomonas, and 100,000 enterococcus. Sensitivities were reviewed. Blood cultures on December 30 are negative. White blood count on admission was 18.3; it peaked December 31 at 85 and is down to 19, hemoglobin 11.7, platelets 230,000. Urinalysis on admission had 325 white cells. Renal function is normal with a creatinine of 0.45. LFTs are normal. CT of the abdomen December 30 showed no acute abnormality, but extensive air and a nondistended colon. No small bowel distention. A KUB January 2, minimal improvement in the sigmoid colon ileus. Chest x-ray December 30 showed low volumes with basilar atelectasis and mild cardiomegaly.   IMPRESSION: A 49 year old gentleman admitted with urosepsis, has an indwelling urinary catheter. He has grown Staphylococcus aureus, pseudomonas, and enterococcus.   RECOMMENDATIONS: 1.  I have asked the laboratory to release the Zosyn sensitivities for the pseudomonas. It was sensitive.  2.  Zosyn will cover the Staphylococcus aureus, enterococcus, and pseudomonas. There is no other oral alternative that would cover all of them, as the pseudomonas was resistant to ciprofloxacin.  3.  He will need at least a 10 day course of IV antibiotics for this infection.  4.  If the suprapubic catheter has not been changed since admission, I would recommend it be changed now.   Thank you for the consultation. I  will be glad to follow with you.    ____________________________ Cheral Marker. Ola Spurr, MD dpf:at D: 09/12/2014 12:33:04 ET T: 09/12/2014 13:22:44 ET JOB#: [id number]  cc: Cheral Marker. Ola Spurr, MD, <Dictator> Zain Lankford Ola Spurr  MD ELECTRONICALLY SIGNED 09/13/2014 21:29

## 2015-01-06 NOTE — Consult Note (Signed)
PATIENT NAME:  Kent [name], Kent [name] MR#:  [id number] DATE OF BIRTH:  July 06, 1966  DATE OF CONSULTATION:  09/11/2014  REFERRING PHYSICIAN:   CONSULTING PHYSICIAN:  Lollie Sails, MD  Patient of Dr. Margaretmary Eddy.    REASON FOR CONSULTATION: Possible upper GI bleeding.   HISTORY OF PRESENT ILLNESS: Kent [name] is a 49 year old Caucasian male who has a history of a progressive neurologic disorder of uncertain diagnosis. He has been evaluated for this at multiple institutions including [facility] and apparently there has been no actual diagnosis. He was in his usual state of health, that of being bedbound, when family noted a change in his mental status on Wednesday morning. He seemed to be very irritated and was shaking all over. The patient does not communicate. He is nonverbal and there is possible dementia involved. His family called EMT who came to see him and then brought him to the Emergency Room. There has been no nausea or vomiting. There has been some questionable abdominal pain at home. When he came to the hospital it was noted that he had a urinary tract infection, possible urosepsis in the setting of a recent suprapubic catheter that was placed. That was done in September of 2015. It was noted on evaluation that he had a very severe ileus with increased distention of the abdomen. This was felt not to be a surgical or obstructive problem by surgery, who have seen him earlier. However an NG tube had been placed and some coffee-ground type material with a pinkish tinge was suctioned out. The patient has been taking omeprazole 40 mg once a day. His apparent caretakers say that he does not take any nonsteroidal anti-inflammatory drugs and he has no history of peptic ulcer disease. However the patient does take warfarin 5 mg daily. His INR was therapeutic on arrival. He has been having problems with alternating bowel habits and caretakers have been giving him at times MiraLAX, at times stool softeners, and  regularly prunes to help him go. He will have a large bowel movement that is formed to semiformed, followed by a day or so of diarrhea. He has been given pain medications at night to help him rest for the past 2 weeks, this being oxycodone. Again the patient is unable to answer questions.   GASTROINTESTINAL FAMILY HISTORY: Pertinent for father and grandmother with peptic ulcer disease.   PAST MEDICAL HISTORY:  1.  Degenerative neurological disorder that began at the age of 32 with difficulty walking, please note above.  2.  History of DVT and PE.  3.  History of atrial fibrillation, on chronic Coumadin.  4.  History of B12 deficiency.  5.  Neurogenic bladder.  6.  Suprapubic catheter placed in September of 2015 is noted.   The patient is generally followed at [facility] and his primary care physician is Dr. Ashok Norris at [facility]. He sees Dr. Ida Rogue, cardiology as well.   REVIEW OF SYSTEMS: Per admission history and physical, unable to obtain.   PHYSICAL EXAMINATION:  VITAL SIGNS: Temperature is 98.7, pulse 90, respirations 20, blood pressure 131/81, pulse oximetry 95%. He is on 1.5 liters O2.  GENERAL: He is a 49 year old Caucasian male. He does not make eye contact or have apparent response to questions. He does not grasp the hand when hand is placed in it.  HEENT: Normocephalic, atraumatic. Eyes are anicteric. Nose, septum midline. There is NG tube in place. The NG tube is noted to have a dark greenish-brown coloration. There  is minimal pink tinging in the fluid that is in the NG tube currently, however the wall unit suction canister looks much more dark bilious.   NECK: No JVD.  HEART: Regular rate and rhythm.  LUNGS: Clear.  ABDOMEN: Distended, moderately firm. Bowel sounds are present, but high pitched and intermittent.  EXTREMITIES: No clubbing or cyanosis. 1 + edema.   LABORATORY, DIAGNOSTIC, AND RADIOLOGICAL DATA: Include the following, he has on  admission a glucose of 130, BUN 9, creatinine 0.59, sodium 139, potassium 3.7, chloride 103, magnesium 1.6. Hepatic profile was normal. Troponin I x 3 have been normal. Hemogram on admission showed a white count of 18.3, H and H 13.6/42.3, platelet count of 292,000. MCV 93. INR was 2.3 with a pro time of 24.4. Urinalysis showed a cloudy urine, 3 + leukocyte esterase, 325 per high-power field white cells, 2 + bacteria. Urine culture positive for Pseudomonas and a second organism as yet unidentified, sensitivities following. A repeat pro time today showed the INR at 2.2. His white count had elevated to 39.0 early this morning and his hemoglobin 11.9. Currently his hemoglobin checked after noting the possibility of blood in the NG tube was 12.1. He had a CT scan of the abdomen and pelvis without contrast showing no acute abnormalities, however fairly extensive air in the nondistended colon and no small bowel distention noted. There was a KUB done that showed moderate diffuse gaseous distention of bowel, particularly stomach and large bowel, findings presumably representing ileus   ASSESSMENT AND PLAN:  The patient presenting with history of undetermined type of progressive neurologic deficit. He recently underwent a placement of a suprapubic catheter due to a neurogenic bladder. The current situation with possible colonic ileus/small bowel ileus/gastric distention could also be related to this etiology. However he also presents with a severe urinary tract infection and possibly sepsis. He has been placed on antibiotics. There is a dark bilious effluent in the suction material from the stomach. It is quite likely that there is blood in this as there is a very slight pink tinge in the NG tube currently, but again most of this is dark green. He has been hemodynamically stable and his hemoglobin has been stable as well. There is a possibility that the NG tube suction has irritated the stomach, however in light of the  findings of the x-ray there is also the possibility that the patient has some peptic ulcer disease. He has been taking Coumadin and has not been on a protectant for the stomach in regards to a proton pump inhibitor. I had a prolonged discussion with his parents. There is lot of stress related to their situation of ongoing care and caretakers.   RECOMMENDATIONS:  1.  Will obtain a Helicobacter pylori serology.  2.  Continue IV PPI as you are. He will need to be on a b.i.d. PPI when he goes home. 3.  I have discussed using intermittent suction technique with the NG tube with nursing staff. Since the situation of the ileus is likely multifactorial to include infection, electrolyte disturbance, as well as unknown contribution from the progressive neurologic disorder, I am uncertain as to the time for resolution of this issue. The patient has apparently not been seen at [facility] in the past, obtaining most of his care at [facility]. I am uncertain as to the status in regards to possible history of colonic ileus.  4.  Case discussed with Dr. Margaretmary Eddy. I would recommend a palliative care consult as well. Will follow  with you. Also recommend daily abdominal films.    ____________________________ Lollie Sails, MD mus:bu D: 09/11/2014 13:46:48 ET T: 09/11/2014 14:09:30 ET JOB#: [id number]  cc: Lollie Sails, MD, <Dictator> Lollie Sails MD ELECTRONICALLY SIGNED 09/22/2014 12:06

## 2015-01-10 NOTE — Op Note (Signed)
Patient: This 49 year old Male had a surgical procedure performed on 09-Oct-2014.  Post Operative Report:  Pre-Op Diagnosis Neurogenic bladder dysfunction; suprapubic catheter malfunction   Post-Op Diagnosis Same   Operation Cystoscopy, placement of Suprapubic Catheter   Anesthesia None   Specimen Type None   Findings As Above  SP tube tract required dilation to 51F to insert new 74F council tip catheter   Surgeon Chales Salmon   Assistant none   EBL: Minimal   Complications None   Description of Procedure: Indications: This is a 49 year old man with global neurologic impairment and neurogenic bladder dysfunction managed with a suprapubic catheter by Dr. Jacqlyn Larsen since 05/2014. He came to the ED with abdominal pain and distention and an attempt to replace the catheter by the ED physician was unsuccessful. Urology was consulted for replacement. His parents who make decisions for him, understood the risks of replacement of the catheter and elected to proceed at bedside.  Description of Procedure: The patients lower abdomen and genitalia were prepped and draped in the usual sterile fashion.  A 0.035" glidewire was passed through the prior SP tube site into the bladder.  A 74F flexible ureteroscope was passed per urethra.  The urethra and bladder appeared normal.  The glidewire was confirmed to be within the bladder.  An attempt was made to pass a 74F council tip catheter over the wire into the bladder but significant resistance was met just below the skin.  The [employer] suprapubic kit was used to sequentially dilate the tract over the wire to 76F/28F and ultimately a 51F peel-away sheath was placed.  The council tip catheter was placed through the sheath and the balloon was inflated to 10cc.  Cystoscopy confirmed proper positioning of the tube in the bladder. It was set to gravity drainage. This concluded the procedure and there were no complications.   Other Pertinent Information He will  follow-up with his outpatient Urologist in 2-3 weeks for SP tube exchange.   Electronic Signatures: Prentiss Bells (MD)  (Signed 29-Jan-16 15:29)  Authored: Patient and Date/Time, Operative Note   Last Updated: 29-Jan-16 15:29 by Prentiss Bells (MD)

## 2015-01-10 NOTE — Consult Note (Signed)
Chief Complaint:  Subjective/Chief Complaint seen for uper gi bleeding.  stable, not recurrent bleeding.  appears very comfortable.   VITAL SIGNS/ANCILLARY NOTES: **Vital Signs.:   05-Jan-16 08:10  Vital Signs Type Q 8hr  Temperature Temperature (F) 98  Celsius 36.6  Temperature Source oral  Respirations Respirations 18  Systolic BP Systolic BP 628  Diastolic BP (mmHg) Diastolic BP (mmHg) 89  Mean BP 109  Pulse Ox % Pulse Ox % 90  Pulse Ox Activity Level  At rest  Oxygen Delivery 5L  *Intake and Output.:   05-Jan-16 06:08  Stool  Pt had large loose stool, brown.   Brief Assessment:  Cardiac Regular   Respiratory clear BS   Gastrointestinal details normal Soft  Nontender  Nondistended  Bowel sounds normal   Radiology Results: XRay:    05-Jan-16 07:24, KUB - Kidney Ureter Bladder  KUB - Kidney Ureter Bladder   REASON FOR EXAM:    ileus, colonic pseudoobstruction, please compare to   previous film.  COMMENTS:       PROCEDURE: DXR - DXR KIDNEY URETER BLADDER  - Sep 15 2014  7:24AM     CLINICAL DATA:  Ileus -colonic pseudo-obstruction    EXAM:  ABDOMEN - 1 VIEW    COMPARISON:  KUB film of January 3rd 2015 and September 12, 2013    FINDINGS:  There has been an overall marked improvement in the appearance of  the bowel gas pattern. Distended colonic loops are no longer  evident. The caliber of the partially gas-filled colon is normal. A  tubular structure is present which likely reflects a rectal tube.  There is no evidence of small-bowel obstruction or ileus. No free  extraluminal gas collections are demonstrated. There are  degenerative changes of both hips.     IMPRESSION:  There has been interval resolution of the intestinal  pseudoobstruction versus ileus.      Electronically Signed    By: David  Martinique    On: 09/15/2014 07:46     Verified By: DAVID A. Martinique, M.D., MD   Assessment/Plan:  Assessment/Plan:  Assessment 1) upper gi bleeding secondary to  NG suction.  not recurrent, no appreciable change to hgb baseline.  2) UTI/urosepsis-improved on abs 3) abdominal distension/colonic pseudoobstruction-resolved.   Plan 1) d/c ngt 2) advance to pre admission diet. 3) would continue bid ppi as o/p.   Electronic Signatures: Loistine Simas (MD)  (Signed 05-Jan-16 13:16)  Authored: Chief Complaint, VITAL SIGNS/ANCILLARY NOTES, Brief Assessment, Radiology Results, Assessment/Plan   Last Updated: 05-Jan-16 13:16 by Loistine Simas (MD)

## 2015-01-10 NOTE — Discharge Summary (Signed)
PATIENT NAME:  [name], [name] MR#:  [id number] DATE OF BIRTH:  09/09/1966  DATE OF ADMISSION:  12/08/2014 DATE OF DISCHARGE:  12/12/2014  PRESENTING COMPLAINT:  High INR.   DISCHARGE DIAGNOSES:  1. Supratherapeutic INR, resolved.  2. Sepsis due to Pseudomonas urinary tract infection.  3. Chronic atrial fibrillation.  4. History of chronic constipation.  5. Morbid obesity.  6. Neurodegenerative disorder, the patient is bedbound.   CODE STATUS: Full code.   MEDICATIONS:  1. Lovastatin 40 mg p.o. daily.  2. Warfarin 5 mg 1 tablet in the morning.  3. Clindamycin topical solution apply to affected area b.i.d.  4. Omeprazole 40 mg p.o. daily.  5. Oxybutynin 5 mg 1 tablet t.i.d.  6. MiraLax 17 grams p.o. daily as needed.  7. Quetiapine 50 mg 3 tablets at bedtime.  8. Mucinex 600 mg extended release p.o. b.i.d. as needed.  9. Nystop topical powder to affected area b.i.d.  10. Ketoconazole 2% topical cream once a day at bedtime.  11. Loratadine 10 mg p.o. at bedtime.  12. Citalopram 20 mg b.i.d.  13. Lorazepam 0.5 mg p.o. b.i.d.  14. Desonide topical 0.005% apply to affected area daily at bedtime.  15. Cyanocobalamine injection once a month.  16. Acetic acid 60 mL once a day.  17. Renacidin irrigation 30 mL 3 times a day.  18. MetroGel apply to affected area daily.  19. Econazole nitrate applied to affected area daily.  20. Senna 1 tablet b.i.d.  21. Fortaz 500 mg twice a day.  22. Donepezil 10 mg p.o. daily at bedtime.   DIET: Regular.   FOLLOWUP:  1. With Dr. Rutherford Nail in 1-2 weeks.  2. Get PT/INR checked on Tuesday April 5 by PCP.  3. Suprapubic catheter care.   LABORATORY DATA:  BUN is 11, creatinine is 0.44, sodium is 138, potassium is 3.6, chloride is 105. Bicarbonate is 28. PT/INR is 15.5 and 1.2.   HOSPITAL COURSE:  Kent [name] is a 49 year old obese Caucasian gentleman with history of neurodegenerative disorder, who is bedbound and wheelchair-bound, nonverbal at  baseline, has a suprapubic catheter, comes in due to elevated INR. He was admitted with:   1.  Sepsis due to UTI with multidrug-resistant Pseudomonas.  He was changed to IV Tressie Ellis, which will be changed to IM Fortaz at home to complete the  remaining days of his treatment. Home health has been arranged.  2.  Supratherapeutic INR, now INR is low and the patient is back on his Coumadin, his INR will be followed up with Dr. Rutherford Nail.  3.  Atrial fibrillation on Coumadin, resumed at discharge. Rate is controlled.  4.  History of chronic constipation with history of neurodegenerative disorder. The patient received MiraLAX, Colace, senna, lactulose, magnesium citrate, had a large BM per nursing staff. He is eating reasonably well. The patient's family members have been instructed for bowel regimen to avoid any severe fecal impaction. His KUB showed gaseous distention. Again please note the patient is wheelchair-bound, nonmobile, and has history of constipation which is chronic.  5.  GERD. Continue PPI.  6.  DVT prophylaxis.  The patient is on Coumadin. Home health RN has been arranged.   Discharge plan was discussed with the patient's father.   TIME SPENT: 40 minutes.     ____________________________ Hart Rochester Posey Pronto, MD sap:bu D: 12/15/2014 11:39:44 ET T: 12/15/2014 12:20:50 ET JOB#: [id number]  cc: Kolby Schara A. Posey Pronto, MD, <Dictator> Ashok Norris, MD Ilda Basset MD ELECTRONICALLY SIGNED 12/15/2014 16:00

## 2015-01-10 NOTE — Consult Note (Signed)
Brief Consult Note: Diagnosis: urosepsis/ams.   Patient was seen by consultant.   Consult note dictated.   Comments: Appreciate consult for 49 y/o caucasian man w hx neurodegenerative disorder, afib, dvt/pe on warfarin, GERD, admitted with urosepsis/elevated INR, for evaluation of constipation with failure to respond to treatment.  Mother at bedside, states his last bowel movement was Sunday and his abdomen has been slightly tighter than normal, but not bigger than normal. Says he gets Miralax periodically for chronic constipation. patient has had KUB and CT of abodmen/pelvis with the findings of moderate stool burden but no obstruction and no acute findings. Evidently he has received Miralax, lactulose, dulcolax, Senna, Mag Citrate, and a fleets enema without results until just recently- his mother reports he has had 2 large bowel movments today. There are no further GI concerns.He is on PPI at home for GERD. Impression and plan: Exacerbation of chronic constipation- likely was related to his acute illness. Has had 2 large bowel movements today per his mother's report. Would recommend daily use of Miralax for his chronic issues, and caution against overuse of laxatives/stool softeners-but no further intervention for acute constipation at this time. Will discuss further with Dr Gustavo Lah.  Electronic Signatures: Stephens November H (NP)  (Signed 01-Apr-16 15:35)  Authored: Brief Consult Note   Last Updated: 01-Apr-16 15:35 by Theodore Demark (NP)

## 2015-01-10 NOTE — Consult Note (Signed)
Chief Complaint:  Subjective/Chief Complaint Please see fulll GI consult and brief consult note.  I am familiar with Mr Beneke.  Probable h/o some amount of bowel dysfunction, and with debility, he is at risk for long term development of  colonic motility d/o such as pseudoobstruction or Ogilvies syndrome.  Currently had bm in response to multiple measures taken.  Will check abd film in am.   He is given miralax irregularly at home and I have encouraged the daily or every other day regular use of miralax.  He also is given prunes daily.    Recommendation as above.   VITAL SIGNS/ANCILLARY NOTES: **Vital Signs.:   01-Apr-16 15:19  Vital Signs Type Q 4hr  Temperature Temperature (F) 98.7  Celsius 37  Pulse Pulse 81  Respirations Respirations 18  Systolic BP Systolic BP 824  Diastolic BP (mmHg) Diastolic BP (mmHg) 80  Mean BP 93  Pulse Ox % Pulse Ox % 92  Pulse Ox Activity Level  At rest  Oxygen Delivery Room Air/ 21 %   Brief Assessment:  Cardiac Regular   Respiratory clear BS   Gastrointestinal details normal Soft  Nontender  Bowel sounds normal  No rebound tenderness  protuberant/obese   Lab Results: Routine Chem:  31-Mar-16 04:24   Glucose, Serum 96 (65-99 NOTE: New Reference Range  11/17/14)  BUN 9 (6-20 NOTE: New Reference Range  11/17/14)  Creatinine (comp)  0.39 (0.61-1.24 NOTE: New Reference Range  11/17/14)  Sodium, Serum 140 (135-145 NOTE: New Reference Range  11/17/14)  Potassium, Serum  3.4 (3.5-5.1 NOTE: New Reference Range  11/17/14)  Chloride, Serum 106 (101-111 NOTE: New Reference Range  11/17/14)  CO2, Serum 27 (22-32 NOTE: New Reference Range  11/17/14)  Calcium (Total), Serum  8.8 (8.9-10.3 NOTE: New Reference Range  11/17/14)  Anion Gap 7  eGFR (African American) >60  eGFR (Non-African American) >60 (eGFR values <47mL/min/1.73 m2 may be an indication of chronic kidney disease (CKD). Calculated eGFR is useful in patients with stable renal  function. The eGFR calculation will not be reliable in acutely ill patients when serum creatinine is changing rapidly. It is not useful in patients on dialysis. The eGFR calculation may not be applicable to patients at the low and high extremes of body sizes, pregnant women, and vegetarians.)  Routine Coag:  31-Mar-16 04:24   Prothrombin  21.2 (11.4-15.0 NOTE: New Reference Range  10/09/14)  INR 1.8 (INR reference interval applies to patients on anticoagulant therapy. A single INR therapeutic range for coumarins is not optimal for all indications; however, the suggested range for most indications is 2.0 - 3.0. Exceptions to the INR Reference Range may include: Prosthetic heart valves, acute myocardial infarction, prevention of myocardial infarction, and combinations of aspirin and anticoagulant. The need for a higher or lower target INR must be assessed individually. Reference: The Pharmacology and Management of the Vitamin K  antagonists: the seventh ACCP Conference on Antithrombotic and Thrombolytic Therapy. MPNTI.1443 Sept:126 (3suppl): [id number]. A HCT value >55% may artifactually increase the PT.  In one study,  the increase was an average of 25%. Reference:  "Effect on Routine and Special Coagulation Testing Values of Citrate Anticoagulant Adjustment in Patients with High HCT Values." American Journal of Clinical Pathology 1540;086:761-950.)  01-Apr-16 04:36   Prothrombin  16.5 (11.4-15.0 NOTE: New Reference Range  10/09/14)  INR 1.3 (INR reference interval applies to patients on anticoagulant therapy. A single INR therapeutic range for coumarins is not optimal for all indications; however, the suggested range  for most indications is 2.0 - 3.0. Exceptions to the INR Reference Range may include: Prosthetic heart valves, acute myocardial infarction, prevention of myocardial infarction, and combinations of aspirin and anticoagulant. The need for a higher or lower target INR  must be assessed individually. Reference: The Pharmacology and Management of the Vitamin K  antagonists: the seventh ACCP Conference on Antithrombotic and Thrombolytic Therapy. [id number].[id number] Sept:126 (3suppl): N9146842. A HCT value >55% may artifactually increase the PT.  In one study,  the increase was an average of 25%. Reference:  "Effect on Routine and Special Coagulation Testing Values of Citrate Anticoagulant Adjustment in Patients with High HCT Values." American Journal of Clinical Pathology 2006;126:400-405.)  Routine Hem:  31-Mar-16 04:24   WBC (CBC) 7.0  RBC (CBC)  3.96  Hemoglobin (CBC)  11.4  Hematocrit (CBC)  34.9  Platelet Count (CBC) 190  MCV 88  MCH 28.8  MCHC 32.7  RDW  15.5  Neutrophil % 69.3  Lymphocyte % 15.7  Monocyte % 12.3  Eosinophil % 1.9  Basophil % 0.8  Neutrophil # 4.9  Lymphocyte # 1.1  Monocyte # 0.9  Eosinophil # 0.1  Basophil # 0.1 (Result(s) reported on 10 Dec 2014 at 06:48AM.)   Assessment/Plan:  Assessment/Plan:  Assessment as noted above.   Electronic Signatures: Loistine Simas (MD)  (Signed 01-Apr-16 19:17)  Authored: Chief Complaint, VITAL SIGNS/ANCILLARY NOTES, Brief Assessment, Lab Results, Assessment/Plan   Last Updated: 01-Apr-16 19:17 by Loistine Simas (MD)

## 2015-01-10 NOTE — Consult Note (Signed)
PATIENT NAME:  Kent [name], Kent [name] MR#:  [id number] DATE OF BIRTH:  [birth year]  DATE OF CONSULTATION:  12/11/2014  REFERRING PHYSICIAN:   CONSULTING PHYSICIAN:  Theodore Demark, NP   REASON FOR CONSULTATION: GI consult ordered by Dr. Posey Pronto for evaluation of constipation and failure to respond to all treatments of relief.   HISTORY OF PRESENT ILLNESS:  I appreciate consult for 49 year old Caucasian man with history of neurodegenerative disorder, atrial fibrillation, DVT, PE on warfarin, GERD, who was admitted with urosepsis and elevated INR for evaluation of constipation with failure to respond to treatment.  The patient is nonverbal.  His mother is at bedside, states his last bowel movement prior to admission was Sunday and his abdomen has been slightly tighter than normal, but not bigger than normal. Says he gets MiraLax periodically for chronic constipation at home.  The patient has had KUB and CT of the abdomen and pelvis with findings of moderate stool burden,  but no obstruction. No acute findings.  Evidently, he has received MiraLax, lactulose, Dulcolax and magnesium citrate and had Fleet enema without results until just recently. His mother reports he has had 2 large bowel movements today. There are no further GI concerns. He is on proton pump inhibitor therapy at home for his GERD.    REVIEW OF SYSTEMS:  Unable to obtain.   PAST MEDICAL HISTORY: Neurodegenerative disorder, neurogenic bladder requiring suprapubic catheterization, paroxysmal atrial fibrillation, PE, DVT on Coumadin, gastrointestinal esophageal reflux disease.   SOCIAL HISTORY: Wheelchair-bound, he requires complete assistance for his activities of daily living.  No tobacco, alcohol, or illicits.   FAMILY HISTORY: No known cardiovascular or pulmonary disorders.   ALLERGIES: CODEINE, VANCOMYCIN.   HOME MEDICATIONS: Warfarin 5 mg daily, lorazepam 0.5 mg b.i.d., citalopram 20 mg b.i.d., loratadine 10 mg p.o. at bedtime,  lovastatin 40 mg daily, Seroquel 150 mg at bedtime, donepezil 10 mg p.o. at bedtime, clindamycin cream b.i.d., Desonide cream topical at bedtime p.r.n., tioconazole 2% topical cream at bedtime p.r.n., Nasonex b.i.d. p.r.n., MiraLax 17 grams daily p.r.n., Prilosec 40 mg p.o. daily, oxybutynin 5 mg p.o. t.i.d., B12 injections monthly.   MOST RECENT LABORATORIES: Glucose 96, BUN 9, creatinine 0.39, sodium 140, potassium 3.4, GFR greater than 60, calcium 8.8, total protein 7.1, albumin 3.7, total bilirubin 0.8, ALP 86, AST 46, ALT 72, WBC 7, hemoglobin 11.4, hematocrit 34.9, platelet count 190,000.  PT 16.5, INR 1.3. Blood cultures negative so far.  Urine cultures growing Pseudomonas. CT and KUB is noted above.   PHYSICAL EXAMINATION:  GENERAL: Chronically ill obese gentleman, resting in bed in no acute distress.  HEENT: Normocephalic, atraumatic. Conjunctivae pink. Sclerae clear.  NECK: Supple. No JVD or thyromegaly.  CHEST: Respirations eupneic. Lungs clear.  CARDIAC: S1, S2, RRR. No MRG. Trace generalized edema.  ABDOMEN: Obese abdomen. Bowel sounds x 4. Soft, nontender, nondistended. Suprapubic catheter is in place with dressing clean, dry and intact. Unable to appreciate any hepatosplenomegaly or masses. There is no rebound, tenderness or other peritoneal signs.  EXTREMITIES: Generalized weakness, some movement.  NEUROLOGIC: Unable to assess fully. He does grunt somewhat.  He does make eye contact, however, he is nonverbal.  He is not following commands.  PSYCHIATRIC: Unable to assess.  SKIN: Warm, dry, pink. No obvious erythema, lesion or rash.   ASSESSMENT AND PLAN:  Exacerbation of chronic constipation. This exacerbation was likely related to his acute illness. He has had 2 large bowel movements today per his mother's report. I would recommend daily use of  MiraLax for his chronic issues and cautioned against overuse of laxatives, stool softeners, but no further intervention for acute constipation  at this time. I will discuss further with Dr. Gustavo Lah.   Thank you very much for this consult. These services were provided by Stephens November, MSN, [facility], in collaboration with Loistine Simas, MD with whom I discussed this patient in full.     ____________________________ Theodore Demark, NP chl:DT D: 12/12/2014 07:10:22 ET T: 12/12/2014 08:06:02 ET JOB#: [id number]  cc: Theodore Demark, NP, <Dictator> [place] SIGNED 12/23/2014 9:25

## 2015-01-10 NOTE — H&P (Signed)
PATIENT NAME:  Kent [name], Kent [name] MR#:  [id number] DATE OF BIRTH:  [birth year]  DATE OF ADMISSION:  12/08/2014  REFERRING PHYSICIAN: Brunilda Payor A. Edd Fabian, MD   PRIMARY CARE PHYSICIAN:  Ashok Norris, MD  CHIEF COMPLAINT: High INR.  HISTORY OF PRESENT ILLNESS:  A 49 year old Caucasian gentleman with history of neurodegenerative disorder, who is currently wheelchair bound, nonverbal at baseline, neurogenic bladder, suprapubic catheter in place, coming in with elevated INR. Patient himself unable to provide any meaningful information given mental status and medical condition. History obtained from mom who is present at bedside. She states that home health came by to check his INR and noticed it to be markedly elevated, thus presented to the hospital for further workup and evaluation. Upon further questioning, mom states that her son has been more somnolent as well as poor p.o. intake for about 2 weeks in total duration; however, no further focalizing symptoms. No known fevers or chills.  REVIEW OF SYSTEMS: Unable to obtain given the patient's mental status and medical condition.   PAST MEDICAL HISTORY: Includes neurodegenerative disorder, neurogenic bladder requiring suprapubic catheterization, paroxysmal atrial fibrillation, history of PE and DVT, on warfarin anticoagulation, gastroesophageal reflux disease without esophagitis.   SOCIAL HISTORY: Wheelchair bound, nonverbal at baseline, able to grunt, obstructive sleep apnea.  FAMILY HISTORY: No known cardiovascular or pulmonary disorders.   ALLERGIES: CODEINE AND VANCOMYCIN.   HOME MEDICATIONS: Include warfarin 5 mg p.o. daily, lorazepam 0.5 mg p.o. b.i.d., citalopram 20 mg p.o. b.i.d., loratadine 10 mg p.o. at bedtime, lovastatin 40 mg p.o. daily, quetiapine 50 mg 3 tablets at bedtime, donepezil 10 mg p.o. at bedtime, clindamycin 1% topical b.i.d., desonide 0.05% topical at bedtime, tioconazole 2% topical cream to affected area at bedtime, Nasonex 600 mg  p.o. b.i.d. as needed, MiraLAX 17 grams daily as needed for constipation, Prilosec 40 mg p.o. daily, oxybutynin 5 mg p.o. 3 times daily, cyanocobalamin 500 mcg injectable monthly.   PHYSICAL EXAMINATION: VITAL SIGNS: On arrival, temperature 92 , heart rate 55, respirations 20, blood pressure 140/103, saturating 97% on room air. Weight 120.2 kg, BMI 33.1.  GENERAL: Obvious chronically ill gentleman, currently in minimal distress given mental status.  HEAD: Normocephalic, atraumatic.  EYES: Pupils equal, round and reactive to light. Extraocular muscles intact. No scleral icterus.  MOUTH: Markedly dry mucosal membranes. Dentition intact. No abscess noted.  EARS, NOSE, THROAT: Clear, without exudates. No external lesions. NECK: Supple. No thyromegaly. No nodules. No JVD.  PULMONARY: Clear to auscultation bilaterally without wheezes, rales or rhonchi. No use of accessory muscles. Good respiratory effort.  CHEST: Nontender to palpation. CARDIOVASCULAR: S1, S2, bradycardic.  No murmurs, rubs or gallops, 2+ edema throughout entire lower extremities. Pedal pulses 2+ bilaterally.  GASTROINTESTINAL: Soft, nontender, nondistended.  No masses. Positive bowel sounds. Suprapubic catheter in place.  Minimal surrounding erythema. No discharge noted at this time. MUSCULOSKELETAL: No swelling, clubbing or edema other than stated above.  Passive range of motion full in all extremities.  NEUROLOGIC: Unable to fully assess given patient's mental status and medical condition.  He is grunting incoherently.  Unable to follow simple commands. SKIN: No ulceration, lesion, rashes or cyanosis. Skin cool and dry, intact. PSYCHIATRIC:  Unable to fully assess given patient's mental status and medical condition, grunting incoherently, unable to follow commands. Unable to provide meaningful information.   LABORATORY DATA: Sodium of 142, potassium 3.8, chloride 105, bicarbonate 31, BUN 11, creatinine 0.4, glucose 105. LFTs: AST of  46, ALT of 72, otherwise within normal limits. WBC  of 8.9, hemoglobin 12.1, platelets of 172,000. INR of 6.6. Urinalysis, WBC of 48, RBCs 11, leukocyte esterase 3+, nitrite positive.   IMAGING:  CT head performed, no acute findings. Chest x-ray performed which reveals no acute cardiopulmonary process.   ASSESSMENT AND PLAN: A 49 year old Caucasian gentleman with history of neurodegenerative disorder, who is wheelchair bound, nonverbal at baseline, came in due to an elevated INR. Family states somnolent, poor p.o. intake.  1.  Sepsis, meeting septic criteria by respiratory rate and temperature, likely secondary to urinary source complicated by suprapubic catheter insertion. Check cultures, urine culture, blood cultures, ceftriaxone, intravenous fluid hydration, to keep mean arterial pressure greater than 65, follow culture data. Adjust antibiotics accordingly.  2.  Supratherapeutic INR. Hold warfarin. Received dosage of vitamin K in the Emergency Room. We will continue to follow INR.  3.  Atrial fibrillation. We will hold anticoagulation, as stated above.  4.  Gastroesophageal reflux disease without esophagitis.  Proton pump inhibitor therapy. 5.  Venous thromboembolism prophylaxis, supratherapeutic INR.  CODE STATUS:  The patient is a full code.   TIME SPENT: 45 minutes.    ____________________________ Aaron Mose. Hower, MD dkh:LT D: 12/08/2014 21:00:04 ET T: 12/08/2014 21:36:49 ET JOB#: [id number]  cc: Aaron Mose. Hower, MD, <Dictator> DAVID Woodfin Ganja MD ELECTRONICALLY SIGNED 12/08/2014 22:47

## 2015-01-10 NOTE — Consult Note (Signed)
Chief Complaint:  Subjective/Chief Complaint patietn appears much more comfortable today.  will make eye contact. NGT in place, minimal fluid in the cannister, bilious green thin only, no blood.   VITAL SIGNS/ANCILLARY NOTES: **Vital Signs.:   04-Jan-16 07:54  Vital Signs Type Q 8hr  Celsius 37.2  Temperature Source oral  Pulse Pulse 77  Respirations Respirations 18  Systolic BP Systolic BP 710  Diastolic BP (mmHg) Diastolic BP (mmHg) 77  Mean BP 93  Pulse Ox % Pulse Ox % 92  Pulse Ox Activity Level  At rest  Oxygen Delivery 5L  *Intake and Output.:   Daily 04-Jan-16 07:00  NG Suction ml     Out:  25   Brief Assessment:  Cardiac Regular   Respiratory clear BS   Gastrointestinal details normal Soft  Nondistended  bowel sounds positive but distant.   Lab Results: Routine Hem:  04-Jan-16 04:18   WBC (CBC)  12.9  RBC (CBC)  3.96  Hemoglobin (CBC)  11.7  Hematocrit (CBC)  36.1  Platelet Count (CBC) 255 (Result(s) reported on 14 Sep 2014 at 06:47AM.)  MCV 91  MCH 29.5  MCHC 32.4  RDW  14.8  Bands 3  Segmented Neutrophils 79  Lymphocytes 7  Monocytes 8  Metamyelocyte 2  NRBC 1  Diff Comment 1 RBCs APPEAR NORMAL  Diff Comment 2 NORMAL PLT MORPHOLGY  Result(s) reported on 14 Sep 2014 at 06:47AM.   Radiology Results: XRay:    03-Jan-16 09:15, KUB - Kidney Ureter Bladder  KUB - Kidney Ureter Bladder   REASON FOR EXAM:    ileus, colonic dilation  COMMENTS:       PROCEDURE: DXR - DXR KIDNEY URETER BLADDER  - Sep 13 2014  9:15AM     CLINICAL DATA:  Ileus.  Colonic dilatation.    EXAM:  ABDOMEN - 1 VIEW    COMPARISON:  One day prior    FINDINGS:  Three supine views abdomen and pelvis. Nonobstructive bowel gas  pattern proximally. Improvement in sigmoid distension, measuring  14.2 cm today versus 17.0 cm on the prior. Rectal catheter in place  with decompression of the rectum. Nasogastric tube terminating at  the body of the stomach.      IMPRESSION:  Improvement in sigmoid colon distension.      Electronically Signed    By: Abigail Miyamoto M.D.    On: 09/13/2014 09:44         Verified By: Areta Haber, M.D.,   Assessment/Plan:  Assessment/Plan:  Assessment 1) colonic pseudoobstruction/ileus. 2) UTI/urosepsis-improved on abx 3) blood noted in NGT- no melena.  ng aspirate normal.   Plan 1) continue ppi, would change to  iv bid in anticipation of change to bid po ptd.  Will recheck films in am and if continued improvement, may be able to start clears back tomorrow, no red no carbonation. He does take cathatrics at home and I would recommend miralax daily for now.   following.   Electronic Signatures: Loistine Simas (MD)  (Signed 04-Jan-16 12:40)  Authored: Chief Complaint, VITAL SIGNS/ANCILLARY NOTES, Brief Assessment, Lab Results, Radiology Results, Assessment/Plan   Last Updated: 04-Jan-16 12:40 by Loistine Simas (MD)

## 2015-01-10 NOTE — Discharge Summary (Signed)
PATIENT NAME:  Kent [name], Kent [name] MR#:  [id number] DATE OF BIRTH:  05-Nov-1965  DATE OF ADMISSION:  09/09/2014 DATE OF DISCHARGE:  09/18/2014  Of note, there has been an interim discharge summary dictated by Dr. Margaretmary Eddy on 09/15/2014; this dictation covers the time between 09/15/2014, and discharge on 09/18/2014.   DISCHARGE DIAGNOSES: 1.  Sepsis due to urinary tract infection. Urine culture is growing pseudomonas and enterococcus. 2.  Adynamic sigmoid ileus.  3.  Urinary tract infection with staphylococcus, enterococcus, and pseudomonas.  4.  Chronic indwelling suprapubic urinary catheter.  5.  Atrial fibrillation, chronic.  6.  Unknown degenerative condition.  7.  History of deep vein thrombosis and pulmonary embolus.  8.  History of B12 deficiency.  9.  Chronic bedbound state.  10.  DO NOT RESUSCITATE.   DISCHARGE MEDICATIONS: 1.  Oxybutynin 5 mg 1 tablet 3 times a day.  2.  Flecainide 50 mg 1 tablet 2 times a day.  3.  Citalopram 20 mg 1 tablet once a day.  4.  Lovastatin 40 mg 1 tablet once a day.  5.  Lorazepam 0.5 mg 1 tablet once a day.  6.  Omeprazole 40 mg 1 capsule once a day.  7.  Quetiapine 50 mg 3 tablets once a day at bedtime.  8.  Loratadine 10 mg once a day.  9.  Mucinex oral tablet 1 tablet every 12 hours.  10.  Donepezil 10 mg 1 tablet once a day at bedtime.  11.  Clindamycin topical 1% solution applied to affected area twice a day.  12.  Cyanocobalamin 1000 mcg per mL injectable solution 1 mL injected once a month.  13.  Polyethylene glycol 3350 oral powder 17 grams once a day as needed for constipation.  14.  Benzonatate 100 mg 1 capsule 3 times a day as needed for cough.  15.  Nystop 100,000 units/g topical powder applied to affected area twice a day.  16.  Fish oil 1000 mg 1 capsule once a day.  17.  Saline nasal mist one spray twice a day.  18.  Ketoconazole topical 2% cream applied to affected area once a day.  19.  Desitin applied topically to affected area  once a day.  20.  Acetaminophen/hydrocodone 325/5 mg oral tablet 1 tablet every 6 hours as needed.  21.  Warfarin 6 mg 1 tablet once a day.  22.  Piperacillin tazobactam 4.5 grams every 8 hours with a stop date of 09/26/2014.   CONSULTATIONS:   1.  Dr. Ola Spurr, infectious disease.  2.  Dr. Gustavo Lah, gastroenterology.  3.  Dr. Ermalinda Memos, palliative care.  4.  Dr. Rayann Heman, gastroenterology.  5.  Dr. Rexene Edison, surgery.   PROCEDURES:  See the interim discharge summary by Dr. Margaretmary Eddy for further details of procedures. There were no procedures performed between January 5 and January 8.   HISTORY OF PRESENT ILLNESS:  Refer to HPI dictated by Dr. Volanda Napoleon on admission and to the interim discharge summary by Dr. Margaretmary Eddy.   HOSPITAL COURSE BY PROBLEM:  1.  Sepsis due to acute cystitis. Sepsis had resolved by the time I began following this patient on January 5. He did grow Staphylococcus aureus, enterococcus, and pseudomonas from urine. Dr. Ola Spurr has been following this patient. He will continue on intravenous Zosyn for a 14-day course. He has a peripherally inserted central catheter line placed, and home health will continue to follow this intravenous antibiotic after discharge.  2.  Adynamic ileus. No obstruction seen on CT scan.  He had a nasogastric tube placed initially and was followed by gastroenterology during hospitalization. He has been started on a proton pump inhibitor. The ileus has been decompressed. He has been having normal bowel movements. He has started to tolerate normal food and is back on his oral medications. Electrolyte imbalances have been corrected.  3.  Atrial fibrillation. Coumadin had been held during the early part of the hospitalization, as there was some concern about upper gastrointestinal bleeding. He has been restarted on Coumadin. He will be on an increased dose of Coumadin at discharge of 6 mg per day. This will be rechecked the Monday after discharge by home health, and his  primary care provider will need to make adjustments from that point on.  4.  History of pulmonary embolus and deep vein thrombosis. The patient is on Coumadin. He was on heparin during the admission for prophylaxis. He will continue to have INRs drawn and Coumadin managed by his primary care physician.  5.  Chronic degenerative condition. The patient's parents have been taking care of him in the outpatient setting for many years. He is bedbound, nonverbal, and does not participate in any of his activities of daily living. They state that they have all of the home health equipment that they need including a lift, whirlpool bath, etc. They also have some in-home care. He is being discharged with home health nursing for peripherally inserted central catheter line care, intravenous antibiotics, and lab draws.  6.  DO NOT RESUSCITATE.   DISCHARGE PHYSICAL EXAMINATION: VITAL SIGNS:  Temperature 98.3, pulse 80, blood pressure 118/80, oxygenation 92% on room air.  GENERAL:  No acute distress.  CARDIOVASCULAR:  Regular rate and rhythm. No murmurs, rubs, or gallops.  RESPIRATORY:  Clear to auscultation on anterior examination with no respiratory distress.  ABDOMEN:  Slightly distended. Bowel sounds are normal. No tenderness with palpation.  PSYCHIATRIC:  The patient is alert. He is nonverbal. He does not show any signs at this time of uncontrolled anxiety or depression. He does not seem to be in pain. His father states that he is at his baseline level of function.   LABORATORY DATA:  Sodium was 142, potassium 3.5, chloride 109, bicarbonate 26, BUN 2, creatinine 0.5, and glucose was 118. INR was 1.4. White blood cells were 10.2, hemoglobin 12.1, platelets 309,000, and MCV was 91.   CONDITION:  This patient has a chronic degenerative condition, which is not fully diagnosed. He has had thorough evaluation for this. He is bedbound, nonverbal, and does not participate in his activities of daily living.    DISPOSITION:  Discharged to home with home health nursing.   DISCHARGE INSTRUCTIONS: DIET:  Regular.  ACTIVITY:  As tolerated.  FOLLOWUP:  The patient will have lab work drawn, which will be sent to his primary care physician for adjustment of Coumadin dosing. He has an appointment for followup on Wednesday, January 20, with Dr. Rutherford Nail at [facility]   Time spent on discharge:  45 minutes.   ____________________________ Earleen Newport. Volanda Napoleon, MD cpw:nb D: 09/24/2014 21:44:04 ET T: 09/24/2014 23:45:55 ET JOB#: [id number]  cc: Barnetta Chapel P. Volanda Napoleon, MD, <Dictator> Ashok Norris, MD Aldean Jewett MD ELECTRONICALLY SIGNED 10/01/2014 18:44

## 2015-01-10 NOTE — Consult Note (Signed)
Admit Diagnosis:   ABD PAIN: Onset Date: 09-Oct-2014, Status: Active, Description: ABD PAIN      Admit Reason:   Neurogenic bladder (N31.9): Status: Active, Coding System: ICD10, Coded Name: Neuromuscular dysfunction of bladder, unspecified    progressive neurological disease: unknown origin   neurogenic bladder:    afib:    DVT/PE:    Right Foot Cellulitis: 13-May-2011   Paralysis: paraplegic   PE - pulmonary embolism:    DVT - Deep Vein Thrombosis:    Sleep Apnea:    metabolic syndrome:    idiopathic sclerosis:    Sleep Disorder:    hypokalemia:   Home Medications: Medication Instructions Status  warfarin 5 mg oral tablet 1 tab(s) orally once a day (in the evening) Active  donepezil 10 mg oral tablet 1 tab(s) orally once a day (at bedtime) Active  cyanocobalamin 1000 mcg/mL injectable solution 1 milliliter(s) injectable once a month Active  lovastatin 40 mg oral tablet 1 tab(s) orally once a day (in the evening) Active  Metoprolol Tartrate 25 mg oral tablet 0.5 tab (12.101m) orally 2 times a day Active  flecainide 50 mg oral tablet 1 tab(s) orally 2 times a day Active  QUEtiapine 50 mg oral tablet 3 tab(s) orally once a day (at bedtime) Active  Mucinex 600 mg oral tablet, extended release 1 tab(s) orally every 12 hours, As Needed for sinus drainage.  Active  Nystop 100000 units/g topical powder Apply topically to affected area 2 times a day Active  ketoconazole topical 2% topical cream Apply topically to affected area once a day (at bedtime) Active  clindamycin 1% topical solution Apply topically to affected area 2 times a day Active  omeprazole 40 mg oral delayed release capsule 1 cap(s) orally once a day (38m before a meal) Active  oxybutynin 5 mg oral tablet 1 tab(s) orally 3 times a day Active  MiraLax - oral powder for reconstitution 17 gram(s) orally once a day, As Needed Active  loratadine 10 mg oral tablet 1 tab(s) orally once a day (at bedtime)  Active  citalopram 20 mg oral tablet 1 tab(s) orally 2 times a day Active  LORazepam 0.5 mg oral tablet 1 tab(s) orally 2 times a day Active  benzonatate 100 mg oral capsule 1 cap(s) orally every 8 hours, As Needed Active  mupirocin topical 2% topical cream Apply topically to affected area 2 times a day Active  atropine-diphenoxylate 0.025 mg-2.5 mg oral tablet 1 tab(s) orally every 8 hours, As Needed Active  fluticasone nasal 50 mcg/inh nasal spray 1 spray(s) nasal 2 times a day Active  desonide topical 0.05% topical cream Apply topically to affected area once a day (at bedtime) Active  clobetasol topical 0.05% topical solution Apply topically to affected area once a day (at bedtime) Active  albuterol-ipratropium 2.5 mg-0.5 mg/3 mL inhalation solution 3 milliliter(s) inhaled every 6 hours, As Needed - for Wheezing Active  acetaminophen-HYDROcodone 325 mg-5 mg oral tablet 1-2 tab(s) orally every 6 hours, As Needed Active   Lab Results: Hepatic:  29-Jan-16 13:45   Bilirubin, Total 0.4  Alkaline Phosphatase 68  SGPT (ALT)  108  SGOT (AST)  76  Total Protein, Serum 7.0  Albumin, Serum  3.2  Routine Chem:  29-Jan-16 13:45   Lipase 100 (Result(s) reported on 09 Oct 2014 at 10:45AM.)  Glucose, Serum 98  BUN 8  Creatinine (comp)  0.53  Sodium, Serum 141  Potassium, Serum 3.8  Chloride, Serum 106  CO2, Serum 28  Calcium (Total), Serum  8.8  Osmolality (calc) 280  eGFR (African American) >60  eGFR (Non-African American) >60 (eGFR values <59m/min/1.73 m2 may be an indication of chronic kidney disease (CKD). Calculated eGFR, using the MRDR Study equation, is useful in  patients with stable renal function. The eGFR calculation will not be reliable in acutely ill patients when serum creatinine is changing rapidly. It is not useful in patients on dialysis. The eGFR calculation may not be applicable to patients at the low and high extremes of body sizes, pregnant women, and vegetarians.)   Anion Gap 7  Cardiac:  29-Jan-16 13:45   Troponin I < 0.02 (0.00-0.05 0.05 ng/mL or less: NEGATIVE  Repeat testing in 3-6 hrs  if clinically indicated. >0.05 ng/mL: POTENTIAL  MYOCARDIAL INJURY. Repeat  testing in 3-6 hrs if  clinically indicated. NOTE: An increase or decrease  of 30% or more on serial  testing suggests a  clinically important change)  Routine UA:  29-Jan-16 10:22   Color (UA) Yellow  Clarity (UA) Turbid  Glucose (UA) Negative  Bilirubin (UA) Negative  Ketones (UA) Negative  Specific Gravity (UA) 1.013  Blood (UA) 1+  pH (UA) 8.0  Protein (UA) 30 mg/dL  Nitrite (UA) Positive  Leukocyte Esterase (UA) 2+ (Result(s) reported on 09 Oct 2014 at 11:25AM.)  RBC (UA) 33 /HPF  WBC (UA) 93 /HPF  Bacteria (UA) NONE SEEN  Epithelial Cells (UA) NONE SEEN  Mucous (UA) PRESENT  Amorphous Crystal (UA) PRESENT (Result(s) reported on 09 Oct 2014 at 11:25AM.)  Routine Coag:  29-Jan-16 13:45   Prothrombin  30.1  INR 3.0 (INR reference interval applies to patients on anticoagulant therapy. A single INR therapeutic range for coumarins is not optimal for all indications; however, the suggested range for most indications is 2.0 - 3.0. Exceptions to the INR Reference Range may include: Prosthetic heart valves, acute myocardial infarction, prevention of myocardial infarction, and combinations of aspirin and anticoagulant. The need for a higher or lower target INR must be assessed individually. Reference: The Pharmacology and Management of the Vitamin K  antagonists: the seventh ACCP Conference on Antithrombotic and Thrombolytic Therapy. CRUEAV.4098Sept:126 (3suppl): [id number] A HCT value >55% may artifactually increase the PT.  In one study,  the increase was an average of 25%. Reference:  "Effect on Routine and Special Coagulation Testing Values of Citrate Anticoagulant Adjustment in Patients with High HCT Values." American Journal of Clinical Pathology  2006;126:400-405.)  Activated PTT (APTT)  41.9 (A HCT value >55% may artifactually increase the APTT. In one study, the increase was an average of 19%. Reference: "Effect on Routine and Special Coagulation Testing Values of Citrate Anticoagulant Adjustment in Patients with High HCT Values." American Journal of Clinical Pathology 2006;126:400-405.)  Routine Hem:  29-Jan-16 13:45   WBC (CBC) 7.0  RBC (CBC)  4.28  Hemoglobin (CBC)  12.6  Hematocrit (CBC)  38.6  Platelet Count (CBC) 271 (Result(s) reported on 09 Oct 2014 at 10:41AM.)  MCV 90  MCH 29.5  MCHC 32.7  RDW  16.3    Codeine: Unknown  vancomycin: Rash   General Aspect This is a 49year old man with an unknown global neurologic disorder with known neurogenic bladder dysfunction managed with a suprapubic catheter placed by Dr. CJacqlyn Larsenin September 2015. It was last changed Dec 30th 2015. Though he is non-communicative, he presented to the ER with evidence of abdominal pain and discomfort, per his parents.  The ED physician attempted SP tube exchange but met resistance at the skin level  and called Urology for assistance.  Parents deny that he has any fevers/chills. He has been moaning more, but otherwise at his neurologic baseline.   Present Illness PAST MEDICAL HISTORY:  1.  Degenerative condition, which manifested at age 61. The mother reports he has had thorough evaluation including at the [facility] and he has not received a diagnosis.  2.  History of DVT and PE.  3.  History of atrial fibrillation, on Coumadin.  4.  History of B12 deficiency.  5.  Neurogenic bladder.  SOCIAL HISTORY: The patient lives with his parents. He does have a personal care service. He is immobile and the home is equipped with a ceiling lift, Whirlpool tub, other assistive equipment. The patient performs none of his own ADLs.   FAMILY MEDICAL HISTORY: Both sets of grandparents had coronary artery disease and hyperlipidemia. His father had prostate cancer,  which is now in remission. No other degenerative conditions in the family.   Case History and Physical Exam:  Chief Complaint Abdominal Pain   Past Surgical History SP Tube placement Sept 2015   HEENT PERLA  Awake and alert, but non-communicative   Neck/Nodes Supple  No Adenopathy   Chest/Lungs Clear   Cardiovascular No Murmurs or Gallops  Normal Sinus Rhythm   Abdomen Benign  Obese; evidence of prior SP tube 2 inches above pubic bone in midline   Genitalia WNL  normal uncirc phallus, normal scrotum and testes   Musculoskeletal Full range of motion   Neurological Other  Global neurologic impairment   Skin Warm  Dry    Impression 49 year old man with global neurologic dysfunction and neurogenic bladder managed with SP tube that could not be replaced in the ED.   Plan 1) Replaced SP catheter by cyscopy and dilation of the tract (see separate note) in the ED, now with new 56F council tip Foley catheter 2) Recommend follow-up in 2-3 weeks for SP tube exchange in the Urology office 3) Dressing changes per routine at home; keep Foley to gravity drainage.  Thank you for involving me in the care of Mr. Badami, please contact Urology with any further questions.   Electronic Signatures: Prentiss Bells (MD)  (Signed 29-Jan-16 15:23)  Authored: Health Issues, Significant Events - History, Home Medications, Labs, Allergies, General Aspect/Present Illness, History and Physical Exam, Impression/Plan   Last Updated: 29-Jan-16 15:23 by Prentiss Bells (MD)

## 2015-01-11 ENCOUNTER — Other Ambulatory Visit: Payer: Self-pay | Admitting: Cardiovascular Disease

## 2015-01-19 ENCOUNTER — Emergency Department
Admission: EM | Admit: 2015-01-19 | Discharge: 2015-01-19 | Disposition: A | Discharge status: Home / Self Care | Payer: Medicare Other | Attending: Internal Medicine | Admitting: Internal Medicine

## 2015-01-19 ENCOUNTER — Encounter: Payer: Self-pay | Admitting: Emergency Medicine

## 2015-01-19 ENCOUNTER — Emergency Department: Payer: Medicare Other

## 2015-01-19 DIAGNOSIS — R8281 Pyuria: Secondary | ICD-10-CM

## 2015-01-19 DIAGNOSIS — J690 Pneumonitis due to inhalation of food and vomit: Secondary | ICD-10-CM | POA: Diagnosis not present

## 2015-01-19 DIAGNOSIS — Z79899 Other long term (current) drug therapy: Secondary | ICD-10-CM | POA: Diagnosis not present

## 2015-01-19 DIAGNOSIS — F419 Anxiety disorder, unspecified: Secondary | ICD-10-CM | POA: Diagnosis not present

## 2015-01-19 DIAGNOSIS — Z7951 Long term (current) use of inhaled steroids: Secondary | ICD-10-CM | POA: Diagnosis not present

## 2015-01-19 DIAGNOSIS — N390 Urinary tract infection, site not specified: Secondary | ICD-10-CM | POA: Diagnosis present

## 2015-01-19 DIAGNOSIS — Z7901 Long term (current) use of anticoagulants: Secondary | ICD-10-CM | POA: Diagnosis not present

## 2015-01-19 DIAGNOSIS — Z960 Presence of urogenital implants: Secondary | ICD-10-CM | POA: Diagnosis not present

## 2015-01-19 DIAGNOSIS — Z792 Long term (current) use of antibiotics: Secondary | ICD-10-CM | POA: Insufficient documentation

## 2015-01-19 DIAGNOSIS — I10 Essential (primary) hypertension: Secondary | ICD-10-CM | POA: Diagnosis not present

## 2015-01-19 LAB — COMPREHENSIVE METABOLIC PANEL
ALBUMIN: 3.6 g/dL (ref 3.5–5.0)
ALT: 40 U/L (ref 17–63)
AST: 32 U/L (ref 15–41)
Alkaline Phosphatase: 82 U/L (ref 38–126)
Anion gap: 7 (ref 5–15)
BILIRUBIN TOTAL: 0.6 mg/dL (ref 0.3–1.2)
BUN: 12 mg/dL (ref 6–20)
CO2: 30 mmol/L (ref 22–32)
Calcium: 9.1 mg/dL (ref 8.9–10.3)
Chloride: 103 mmol/L (ref 101–111)
Creatinine, Ser: 0.5 mg/dL — ABNORMAL LOW (ref 0.61–1.24)
GFR calc Af Amer: >60 mL/min (ref >60)
Glucose, Bld: 106 mg/dL — ABNORMAL HIGH (ref 65–99)
POTASSIUM: 3.9 mmol/L (ref 3.5–5.1)
SODIUM: 140 mmol/L (ref 135–145)
Total Protein: 7.3 g/dL (ref 6.5–8.1)

## 2015-01-19 LAB — URINALYSIS COMPLETE WITH MICROSCOPIC (ARMC ONLY)
Bilirubin Urine: NEGATIVE
Glucose, UA: NEGATIVE mg/dL
Hgb urine dipstick: NEGATIVE
KETONES UR: NEGATIVE mg/dL
Nitrite: POSITIVE — AB
Protein, ur: 30 mg/dL — AB
SPECIFIC GRAVITY, URINE: 1.019 (ref 1.005–1.030)
SQUAMOUS EPITHELIAL / LPF: NONE SEEN
pH: 6 (ref 5.0–8.0)

## 2015-01-19 LAB — CBC WITH DIFFERENTIAL/PLATELET
BASOS ABS: 0.1 10*3/uL (ref 0–0.1)
Basophils Relative: 1 %
Eosinophils Absolute: 0.2 10*3/uL (ref 0–0.7)
Eosinophils Relative: 2 %
HCT: 37.3 % — ABNORMAL LOW (ref 40.0–52.0)
Hemoglobin: 12.1 g/dL — ABNORMAL LOW (ref 13.0–18.0)
LYMPHS PCT: 16 %
Lymphs Abs: 1.3 10*3/uL (ref 1.0–3.6)
MCH: 28.6 pg (ref 26.0–34.0)
MCHC: 32.5 g/dL (ref 32.0–36.0)
MCV: 88 fL (ref 80.0–100.0)
MONO ABS: 1.1 10*3/uL — AB (ref 0.2–1.0)
Monocytes Relative: 14 %
NEUTROS ABS: 5.7 10*3/uL (ref 1.4–6.5)
NEUTROS PCT: 67 %
PLATELETS: 264 10*3/uL (ref 150–440)
RBC: 4.24 MIL/uL — ABNORMAL LOW (ref 4.40–5.90)
RDW: 16.1 % — AB (ref 11.5–14.5)
WBC: 8.4 10*3/uL (ref 3.8–10.6)

## 2015-01-19 MED ORDER — VANCOMYCIN HCL IN DEXTROSE 1-5 GM/200ML-% IV SOLN
INTRAVENOUS | Status: AC
  Filled 2015-01-19: qty 200

## 2015-01-19 MED ORDER — PIPERACILLIN-TAZOBACTAM 3.375 G IVPB
INTRAVENOUS | Status: AC
  Filled 2015-01-19: qty 50

## 2015-01-19 MED ORDER — ONDANSETRON HCL 4 MG/2ML IJ SOLN
INTRAMUSCULAR | Status: AC
  Filled 2015-01-19: qty 2

## 2015-01-19 MED ORDER — MORPHINE SULFATE 4 MG/ML IJ SOLN
INTRAMUSCULAR | Status: AC
  Filled 2015-01-19: qty 1

## 2015-01-19 MED ORDER — SODIUM CHLORIDE 0.9 % IV SOLN
INTRAVENOUS | Status: DC
  Administered 2015-01-19: 18:00:00 via INTRAVENOUS

## 2015-01-19 NOTE — ED Notes (Signed)
Pt from home via [facility]; pt was tested today at home for UTI and then sent here for tx. Mother at bedside, reports pt has been on levaquin x5 days for cough and chest congestion; reports pt is at baseline. EMS reports low temperature, pale skin and VSS. EMS reports CBG 133.

## 2015-01-19 NOTE — ED Provider Notes (Addendum)
[facility] Emergency Department Provider Note  ____________________________________________  Time seen: Approximately 5:07 PM  I have reviewed the triage vital signs and the nursing notes.   HISTORY  Chief Complaint Urinary Tract Infection    HPI Kent [name] is a 49 y.o. male who presents to the emergency department with his parents at the suggestion of a home health nurse for reevaluation of possible urinary tract infection.  1 week ago patient had a low-grade fever and the home health nurse obtained a sample from his suprapubic catheter to evaluate for UTI. He had just finished a dose of antibiotic cephalexin for UTI that was prescribed for him by Dr. Kerman Passey in the emergency department at [facility]. The urine culture after the completion of antibiotics still showed some variables that were concerning for an infection.  The patient has a suprapubic catheter that was changed 1 month ago at Dr. copes office/the urologist.  Since the time that this sample was obtained last week the patient was started on Levaquin by Dr. Gwynneth Aliment at [facility] for bronchitis and possibly pneumonia. At that time the patient had presented to Dr. Gwynneth Aliment for cough congestion and rattling in the chest. Patient has been on Levaquin for the past 6 days with some improvement in those symptoms.  A has a neurologic problem diagnosed at age 67, he is bedbound and requires complete care including feeding by his parents. He has not been ambulatory since 1996 and functions at a power wheelchair with the assistance of his parents. He is still able to eat solid foods but often chokes on liquids.   He has not had any recent fever.   Past Medical History  Diagnosis Date  . Frontotemporal dementia   . Sleep apnea   . Hypertension   . Depression   . Neurogenic bladder   . Myelopathy   . Obesity   . Degeneration, intervertebral disc, cervical    . Elevated liver function tests   . Pulmonary embolus     on chronic anticoagulation  . Chronic UTI (urinary tract infection)   . Pernicious anemia   . Rosacea   . BPH (benign prostatic hypertrophy)   . History of DVT (deep vein thrombosis)   . History of pulmonary embolism     currently on coumadin  . Anxiety   . New onset a-fib 2013    Patient Active Problem List   Diagnosis Date Noted  . A-fib 08/21/2012  . Dementia 08/21/2012  . Hypertension 08/21/2012  . Hyperlipidemia 08/21/2012  . History of pulmonary embolism 08/21/2012    Past Surgical History  Procedure Laterality Date  . Suprapubic catheter insertion    . Kidney stone surgery      Current Outpatient Rx  Name  Route  Sig  Dispense  Refill  . benzonatate (TESSALON) 100 MG capsule   Oral   Take 100 mg by mouth at bedtime.         . citalopram (CELEXA) 20 MG tablet   Oral   Take 20 mg by mouth 2 (two) times daily.         . Cyanocobalamin (VITAMIN B-12 IJ)   Injection   Inject 500 mcg as directed.         Marland Kitchen dextromethorphan (DELSYM) 30 MG/5ML liquid   Oral   Take 15 mg by mouth 2 (two) times daily.         Marland Kitchen econazole nitrate 1 % cream   Topical  Apply 1 application topically as directed.         . flecainide (TAMBOCOR) 50 MG tablet      TAKE 1 TABLET (50 MG TOTAL) BY MOUTH 2 (TWO) TIMES DAILY.   60 tablet   5   . fluticasone (FLONASE) 50 MCG/ACT nasal spray   Each Nare   Place 1 spray into both nostrils 2 (two) times daily.         Marland Kitchen guaiFENesin (MUCINEX) 600 MG 12 hr tablet   Oral   Take 600 mg by mouth 2 (two) times daily as needed.         Marland Kitchen HYDROcodone-acetaminophen (NORCO/VICODIN) 5-325 MG per tablet   Oral   Take 1 tablet by mouth every 6 (six) hours as needed for moderate pain.         Marland Kitchen ketoconazole (NIZORAL) 2 % shampoo   Topical   Apply 1 application topically 2 (two) times a week.         Marland Kitchen levofloxacin (LEVAQUIN) 500 MG tablet   Oral   Take 500 mg by  mouth daily.         Marland Kitchen loratadine (CLARITIN) 10 MG tablet   Oral   Take 10 mg by mouth daily.         Marland Kitchen LORazepam (ATIVAN) 0.5 MG tablet   Oral   Take 0.5 mg by mouth 2 (two) times daily.         Marland Kitchen lovastatin (MEVACOR) 40 MG tablet   Oral   Take 40 mg by mouth at bedtime.         . metoprolol tartrate (LOPRESSOR) 25 MG tablet      TAKE 1 TABLET (25 MG TOTAL) BY MOUTH 2 (TWO) TIMES DAILY.   60 tablet   6   . metroNIDAZOLE (METROGEL) 1 % gel   Topical   Apply 1 application topically daily.         Marland Kitchen omeprazole (PRILOSEC) 40 MG capsule   Oral   Take 40 mg by mouth daily.         Marland Kitchen oxybutynin (DITROPAN) 5 MG tablet   Oral   Take 5 mg by mouth 3 (three) times daily.       6   . Polyethylene Glycol 3350 (MIRALAX PO)   Oral   Take by mouth daily.         . QUEtiapine (SEROQUEL) 50 MG tablet   Oral   Take 50 mg by mouth at bedtime.         Marland Kitchen warfarin (COUMADIN) 5 MG tablet   Oral   Take 2.5-5 mg by mouth daily. Alternate 2.5 and 5 mg.         . flecainide (TAMBOCOR) 50 MG tablet      TAKE 1 TABLET (50 MG TOTAL) BY MOUTH 2 (TWO) TIMES DAILY. Patient not taking: Reported on 01/19/2015   60 tablet   3   . metoprolol tartrate (LOPRESSOR) 25 MG tablet      TAKE 1 TABLET (25 MG TOTAL) BY MOUTH 2 (TWO) TIMES DAILY. Patient not taking: Reported on 01/19/2015   60 tablet   3   . NON FORMULARY      cpap daily at bedtime.           Allergies Codeine and Vancomycin  Family History  Problem Relation Age of Onset  . Hypertension Father   . Hyperlipidemia Father   . Hypertension Mother     Social History History  Substance Use Topics  . Smoking status: Never Smoker   . Smokeless tobacco: Not on file  . Alcohol Use: No    Review of Systems  Constitutional: No fever/chills Eyes: No visual changes. ENT: No sore throat. Cardiovascular: Denies chest pain. Respiratory: Denies shortness of breath. Gastrointestinal: No abdominal pain.  No  nausea, no vomiting.  No diarrhea.  No constipation. Genitourinary: Negative for dysuria. Musculoskeletal: Negative for back pain. Skin: Negative for rash. Neurological: Negative for headaches, focal weakness or numbness. That 10-point ROS otherwise negative.  ____________________________________________   PHYSICAL EXAM:  VITAL SIGNS: ED Triage Vitals  Enc Vitals Group     BP 01/19/15 1628 115/77 mmHg     Pulse Rate 01/19/15 1628 60     Resp 01/19/15 1628 16     Temp 01/19/15 1628 97.1 F (36.2 C)     Temp Source 01/19/15 1628 Oral     SpO2 01/19/15 1628 97 %     Weight 01/19/15 1628 290 lb (131.543 kg)     Height --      Head Cir --      Peak Flow --      Pain Score --      Pain Loc --      Pain Edu? --      Excl. in [place]? --   Initial vital signs in the emergency department are reviewed and are stable and not abnormal. Patient is afebrile.  Patient became slightly hypertensive in the emergency department during the ER visit this may reflect anxiety from being in in a strange environment. Not require treatment.  Constitutional: Alert and nonviable. Well appearing and in no acute distress. Does not follow commands. This is baseline according to parents Eyes: Conjunctivae are normal. PERRL. EOMI. Head: Atraumatic. Nose: No congestion/rhinnorhea. Mouth/Throat: Mucous membranes are moist.  Oropharynx non-erythematous. Neck: No stridor.   That Cardiovascular: Normal rate, regular rhythm. Grossly normal heart sounds.  Good peripheral circulation. Respiratory: Normal respiratory effort. Decreased breath sounds at bases bilaterally. No retractions. Lungs CTAB. Gastrointestinal: Soft and nontender. No distention. No abdominal bruits. No CVA tenderness. Musculoskeletal: No lower extremity tenderness nor edema.  No joint effusions. Neurologic:  No speech and language. No gross focal neurologic deficits are appreciated.  Skin:  Skin is warm, dry and intact. No rash noted. Psychiatric:  Mood and affect are normal. Speech and behavior are normal.  ____________________________________________   LABS (all labs ordered are listed, but only abnormal results are displayed)  Labs Reviewed  CBC WITH DIFFERENTIAL/PLATELET - Abnormal; Notable for the following:    RBC 4.24 (*)    Hemoglobin 12.1 (*)    HCT 37.3 (*)    RDW 16.1 (*)    Monocytes Absolute 1.1 (*)    All other components within normal limits  COMPREHENSIVE METABOLIC PANEL - Abnormal; Notable for the following:    Glucose, Bld 106 (*)    Creatinine, Ser 0.50 (*)    All other components within normal limits  URINALYSIS COMPLETEWITH MICROSCOPIC ([facility])  - Abnormal; Notable for the following:    Color, Urine BLUE (*)    APPearance CLOUDY (*)    Protein, ur 30 (*)    Nitrite POSITIVE (*)    Leukocytes, UA 3+ (*)    Bacteria, UA RARE (*)    All other components within normal limits  URINE CULTURE   Labs were evaluated CBC is normal with no elevation of his WBC is very slightly anemic. Glucose is slightly elevated at 106 creatinine  is normal at 0.5 Urinary specimen shows a blue discoloration to the urine. I do not see this when I look at the urine is yellow and normal color. His urine does test positive for nitrites leukocytes and bacteria. Consistent with UTI.  EKG  ED ECG REPORT   Date: 01/19/2015  EKG Time: 17:39  Rate: 59  Rhythm: normal EKG, normal sinus rhythm, unchanged from previous tracings, sinus bradycardia  Axis: Normal  Intervals:first-degree A-V block   ST&T Change: Nonspecific STT wave changes  ____________________________________________  RADIOLOGY  IMPRESSION: Chest x-ray  Right base linear scarring or atelectasis. Unchanged cardiomegaly.   Electronically Signed By: Andreas Newport M.D. On: 01/19/2015 20:41         There is No evidence of pneumonia on the chest x-ray ____________________________________________   PROCEDURES  Procedure(s) performed:  None  Critical Care performed: No  ____________________________________________   INITIAL IMPRESSION / ASSESSMENT AND PLAN / ED COURSE  Pertinent labs & imaging results that were available during my care of the patient were reviewed by me and considered in my medical decision making (see chart for details).  Patient is a 49 year old disabled gentleman who requires full care at home. He is nonverbal and nonambulatory which is his baseline. He is brought to the ER for evaluation for possible urinary tract infection.  Despite treatment for her previous urinary tract infection and despite being on antibiotics at the present time/Levaquin/ for pneumonia which by the way appears to be resolved, he still has evidence of pyuria bacteria and positive nitrates on his UTI. He does not have an elevated white count and he does not have a fever. Does not appear septic. He is eating and not vomiting. He has had no change in his mental status.  He does have an indwelling suprapubic catheter which was changed within the past month. He is presently afebrile he's presently still on antibiotics.  I will send a urine culture and will determine whether this is just contamination from the catheter or an actual infection.  Recommend follow-up with urology, Dr. Jacqlyn Larsen with appropriate antibiotics if necessary to treat infection. Patient may need to have the catheter replaced again by Dr. Jacqlyn Larsen also.  This has all been communicated to the parents.   FINAL CLINICAL IMPRESSION(S) / ED DIAGNOSES  Final diagnoses:  Indwelling catheter present on admission  Pyuria  Aspiration pneumonia, unspecified aspiration pneumonia type      Boris Lown, DO 01/19/15 2114  Boris Lown, DO 01/19/15 2122

## 2015-01-19 NOTE — Discharge Instructions (Signed)
Complaint the present antibiotic you are taking which is levaquin. Continue other meds. Increase fluids. See Dr. Jacqlyn Larsen in follow-up. A urine culture was sent from the emergency department and needs to be followed up on when the results come back to see if you need any additional antibiotic treatment. In terms worsen or he develops fever nausea vomiting or any other symptoms return to the ER.

## 2015-01-21 DIAGNOSIS — N3281 Overactive bladder: Secondary | ICD-10-CM | POA: Insufficient documentation

## 2015-01-21 DIAGNOSIS — N3020 Other chronic cystitis without hematuria: Secondary | ICD-10-CM | POA: Insufficient documentation

## 2015-01-23 LAB — URINE CULTURE

## 2015-01-30 ENCOUNTER — Inpatient Hospital Stay: Payer: Medicare Other

## 2015-01-30 ENCOUNTER — Emergency Department: Payer: Medicare Other

## 2015-01-30 ENCOUNTER — Inpatient Hospital Stay
Admission: EM | Admit: 2015-01-30 | Discharge: 2015-02-03 | DRG: 872 | Disposition: A | Discharge status: Home / Self Care | Payer: Medicare Other | Attending: Internal Medicine | Admitting: Internal Medicine

## 2015-01-30 ENCOUNTER — Encounter: Payer: Self-pay | Admitting: Emergency Medicine

## 2015-01-30 DIAGNOSIS — I10 Essential (primary) hypertension: Secondary | ICD-10-CM | POA: Diagnosis present

## 2015-01-30 DIAGNOSIS — F79 Unspecified intellectual disabilities: Secondary | ICD-10-CM | POA: Diagnosis present

## 2015-01-30 DIAGNOSIS — E669 Obesity, unspecified: Secondary | ICD-10-CM | POA: Diagnosis present

## 2015-01-30 DIAGNOSIS — L89309 Pressure ulcer of unspecified buttock, unspecified stage: Secondary | ICD-10-CM | POA: Diagnosis not present

## 2015-01-30 DIAGNOSIS — F0390 Unspecified dementia without behavioral disturbance: Secondary | ICD-10-CM

## 2015-01-30 DIAGNOSIS — R680 Hypothermia, not associated with low environmental temperature: Secondary | ICD-10-CM | POA: Diagnosis present

## 2015-01-30 DIAGNOSIS — T68XXXA Hypothermia, initial encounter: Secondary | ICD-10-CM

## 2015-01-30 DIAGNOSIS — N319 Neuromuscular dysfunction of bladder, unspecified: Secondary | ICD-10-CM | POA: Diagnosis present

## 2015-01-30 DIAGNOSIS — Z66 Do not resuscitate: Secondary | ICD-10-CM | POA: Diagnosis present

## 2015-01-30 DIAGNOSIS — I4891 Unspecified atrial fibrillation: Secondary | ICD-10-CM | POA: Diagnosis present

## 2015-01-30 DIAGNOSIS — Z7901 Long term (current) use of anticoagulants: Secondary | ICD-10-CM

## 2015-01-30 DIAGNOSIS — T8351XA Infection and inflammatory reaction due to indwelling urinary catheter, initial encounter: Secondary | ICD-10-CM | POA: Diagnosis present

## 2015-01-30 DIAGNOSIS — K219 Gastro-esophageal reflux disease without esophagitis: Secondary | ICD-10-CM | POA: Diagnosis present

## 2015-01-30 DIAGNOSIS — Z86718 Personal history of other venous thrombosis and embolism: Secondary | ICD-10-CM | POA: Diagnosis not present

## 2015-01-30 DIAGNOSIS — Z881 Allergy status to other antibiotic agents status: Secondary | ICD-10-CM | POA: Diagnosis not present

## 2015-01-30 DIAGNOSIS — R4182 Altered mental status, unspecified: Secondary | ICD-10-CM | POA: Diagnosis not present

## 2015-01-30 DIAGNOSIS — G4730 Sleep apnea, unspecified: Secondary | ICD-10-CM | POA: Diagnosis present

## 2015-01-30 DIAGNOSIS — R001 Bradycardia, unspecified: Secondary | ICD-10-CM | POA: Diagnosis present

## 2015-01-30 DIAGNOSIS — Z6841 Body Mass Index (BMI) 40.0 and over, adult: Secondary | ICD-10-CM | POA: Diagnosis not present

## 2015-01-30 DIAGNOSIS — Z886 Allergy status to analgesic agent status: Secondary | ICD-10-CM

## 2015-01-30 DIAGNOSIS — Z87440 Personal history of urinary (tract) infections: Secondary | ICD-10-CM | POA: Diagnosis not present

## 2015-01-30 DIAGNOSIS — Z9889 Other specified postprocedural states: Secondary | ICD-10-CM

## 2015-01-30 DIAGNOSIS — K5900 Constipation, unspecified: Secondary | ICD-10-CM | POA: Diagnosis present

## 2015-01-30 DIAGNOSIS — Z87442 Personal history of urinary calculi: Secondary | ICD-10-CM | POA: Diagnosis not present

## 2015-01-30 DIAGNOSIS — Z86711 Personal history of pulmonary embolism: Secondary | ICD-10-CM

## 2015-01-30 DIAGNOSIS — Z1624 Resistance to multiple antibiotics: Secondary | ICD-10-CM | POA: Diagnosis present

## 2015-01-30 DIAGNOSIS — A419 Sepsis, unspecified organism: Principal | ICD-10-CM | POA: Diagnosis present

## 2015-01-30 DIAGNOSIS — G3109 Other frontotemporal dementia: Secondary | ICD-10-CM | POA: Diagnosis present

## 2015-01-30 DIAGNOSIS — R21 Rash and other nonspecific skin eruption: Secondary | ICD-10-CM | POA: Diagnosis present

## 2015-01-30 DIAGNOSIS — G319 Degenerative disease of nervous system, unspecified: Secondary | ICD-10-CM

## 2015-01-30 DIAGNOSIS — Z7951 Long term (current) use of inhaled steroids: Secondary | ICD-10-CM

## 2015-01-30 DIAGNOSIS — Z883 Allergy status to other anti-infective agents status: Secondary | ICD-10-CM | POA: Diagnosis not present

## 2015-01-30 DIAGNOSIS — N390 Urinary tract infection, site not specified: Secondary | ICD-10-CM | POA: Diagnosis present

## 2015-01-30 DIAGNOSIS — Z79899 Other long term (current) drug therapy: Secondary | ICD-10-CM | POA: Diagnosis not present

## 2015-01-30 DIAGNOSIS — B965 Pseudomonas (aeruginosa) (mallei) (pseudomallei) as the cause of diseases classified elsewhere: Secondary | ICD-10-CM | POA: Diagnosis present

## 2015-01-30 DIAGNOSIS — Z7401 Bed confinement status: Secondary | ICD-10-CM | POA: Diagnosis not present

## 2015-01-30 DIAGNOSIS — Z9359 Other cystostomy status: Secondary | ICD-10-CM

## 2015-01-30 DIAGNOSIS — Z8249 Family history of ischemic heart disease and other diseases of the circulatory system: Secondary | ICD-10-CM | POA: Diagnosis not present

## 2015-01-30 LAB — CBC WITH DIFFERENTIAL/PLATELET
Basophils Absolute: 0.1 10*3/uL (ref 0–0.1)
Basophils Relative: 1 %
EOS PCT: 2 %
Eosinophils Absolute: 0.2 10*3/uL (ref 0–0.7)
HEMATOCRIT: 38.7 % — AB (ref 40.0–52.0)
Hemoglobin: 12.3 g/dL — ABNORMAL LOW (ref 13.0–18.0)
Lymphocytes Relative: 16 %
Lymphs Abs: 1.4 10*3/uL (ref 1.0–3.6)
MCH: 28.1 pg (ref 26.0–34.0)
MCHC: 31.9 g/dL — ABNORMAL LOW (ref 32.0–36.0)
MCV: 88.1 fL (ref 80.0–100.0)
Monocytes Absolute: 0.8 10*3/uL (ref 0.2–1.0)
Monocytes Relative: 9 %
NEUTROS PCT: 72 %
Neutro Abs: 5.9 10*3/uL (ref 1.4–6.5)
PLATELETS: 229 10*3/uL (ref 150–440)
RBC: 4.4 MIL/uL (ref 4.40–5.90)
RDW: 16.7 % — ABNORMAL HIGH (ref 11.5–14.5)
WBC: 8.3 10*3/uL (ref 3.8–10.6)

## 2015-01-30 LAB — URINALYSIS COMPLETE WITH MICROSCOPIC (ARMC ONLY)
BILIRUBIN URINE: NEGATIVE
Glucose, UA: NEGATIVE mg/dL
Ketones, ur: NEGATIVE mg/dL
Nitrite: POSITIVE — AB
PH: 7 (ref 5.0–8.0)
Protein, ur: NEGATIVE mg/dL
Specific Gravity, Urine: 1.015 (ref 1.005–1.030)
Squamous Epithelial / LPF: NONE SEEN

## 2015-01-30 LAB — COMPREHENSIVE METABOLIC PANEL
ALBUMIN: 3.7 g/dL (ref 3.5–5.0)
ALK PHOS: 81 U/L (ref 38–126)
ALT: 44 U/L (ref 17–63)
ANION GAP: 6 (ref 5–15)
AST: 38 U/L (ref 15–41)
BILIRUBIN TOTAL: 0.6 mg/dL (ref 0.3–1.2)
BUN: 11 mg/dL (ref 6–20)
CO2: 30 mmol/L (ref 22–32)
Calcium: 9.3 mg/dL (ref 8.9–10.3)
Chloride: 104 mmol/L (ref 101–111)
Creatinine, Ser: 0.46 mg/dL — ABNORMAL LOW (ref 0.61–1.24)
GFR calc non Af Amer: >60 mL/min (ref >60)
Glucose, Bld: 86 mg/dL (ref 65–99)
Potassium: 4 mmol/L (ref 3.5–5.1)
Sodium: 140 mmol/L (ref 135–145)
Total Protein: 7.2 g/dL (ref 6.5–8.1)

## 2015-01-30 LAB — PROTIME-INR
INR: 2.72
PROTHROMBIN TIME: 28.9 s — AB (ref 11.4–15.0)

## 2015-01-30 LAB — LACTIC ACID, PLASMA: LACTIC ACID, VENOUS: 1.1 mmol/L (ref 0.5–2.0)

## 2015-01-30 LAB — TSH: TSH: 3.965 u[IU]/mL (ref 0.350–4.500)

## 2015-01-30 MED ORDER — LORAZEPAM 0.5 MG PO TABS
0.5000 mg | ORAL_TABLET | Freq: Two times a day (BID) | ORAL | Status: DC
  Administered 2015-01-30 – 2015-02-03 (×8): 0.5 mg via ORAL
  Filled 2015-01-30 (×8): qty 1

## 2015-01-30 MED ORDER — POLYETHYLENE GLYCOL 3350 17 GM/SCOOP PO POWD
1.0000 | ORAL | Status: DC
  Filled 2015-01-30: qty 255

## 2015-01-30 MED ORDER — CITALOPRAM HYDROBROMIDE 20 MG PO TABS
20.0000 mg | ORAL_TABLET | Freq: Two times a day (BID) | ORAL | Status: DC
  Administered 2015-01-30 – 2015-02-03 (×8): 20 mg via ORAL
  Filled 2015-01-30 (×8): qty 1

## 2015-01-30 MED ORDER — IPRATROPIUM-ALBUTEROL 0.5-2.5 (3) MG/3ML IN SOLN: 3.0000 mL | RESPIRATORY_TRACT | Status: DC | PRN

## 2015-01-30 MED ORDER — PRAVASTATIN SODIUM 20 MG PO TABS
40.0000 mg | ORAL_TABLET | Freq: Every day | ORAL | Status: DC
  Administered 2015-01-31 – 2015-02-02 (×3): 40 mg via ORAL
  Filled 2015-01-30 (×3): qty 2

## 2015-01-30 MED ORDER — LORATADINE 10 MG PO TABS
10.0000 mg | ORAL_TABLET | Freq: Every day | ORAL | Status: DC
  Administered 2015-01-30 – 2015-02-03 (×5): 10 mg via ORAL
  Filled 2015-01-30 (×5): qty 1

## 2015-01-30 MED ORDER — PANTOPRAZOLE SODIUM 40 MG PO TBEC
40.0000 mg | DELAYED_RELEASE_TABLET | Freq: Every day | ORAL | Status: DC
  Administered 2015-01-30 – 2015-02-03 (×4): 40 mg via ORAL
  Filled 2015-01-30 (×5): qty 1

## 2015-01-30 MED ORDER — ONDANSETRON HCL 4 MG/2ML IJ SOLN: 4.0000 mg | Freq: Four times a day (QID) | INTRAMUSCULAR | Status: DC | PRN

## 2015-01-30 MED ORDER — FLUTICASONE PROPIONATE 50 MCG/ACT NA SUSP
1.0000 | Freq: Two times a day (BID) | NASAL | Status: DC
  Administered 2015-01-30 – 2015-02-03 (×8): 1 via NASAL
  Filled 2015-01-30: qty 16

## 2015-01-30 MED ORDER — POLYETHYLENE GLYCOL 3350 17 G PO PACK
17.0000 g | PACK | Freq: Every day | ORAL | Status: DC | PRN
  Administered 2015-01-30 – 2015-02-01 (×5): 17 g via ORAL
  Filled 2015-01-30 (×4): qty 1

## 2015-01-30 MED ORDER — HEPARIN SODIUM (PORCINE) 5000 UNIT/ML IJ SOLN: 5000.0000 [IU] | Freq: Three times a day (TID) | INTRAMUSCULAR | Status: DC

## 2015-01-30 MED ORDER — ONDANSETRON HCL 4 MG PO TABS: 4.0000 mg | ORAL_TABLET | Freq: Four times a day (QID) | ORAL | Status: DC | PRN

## 2015-01-30 MED ORDER — CLINDAMYCIN PHOSPHATE 1 % EX SOLN
1.0000 "application " | Freq: Two times a day (BID) | CUTANEOUS | Status: DC
  Administered 2015-02-01: 1 via TOPICAL
  Filled 2015-01-30 (×3): qty 30

## 2015-01-30 MED ORDER — SODIUM CHLORIDE 0.9 % IV BOLUS (SEPSIS)
1000.0000 mL | Freq: Once | INTRAVENOUS | Status: AC
  Administered 2015-01-30: 1000 mL via INTRAVENOUS

## 2015-01-30 MED ORDER — PIPERACILLIN-TAZOBACTAM 4.5 G IVPB
4.5000 g | Freq: Three times a day (TID) | INTRAVENOUS | Status: DC
Start: 2015-01-30 — End: 2015-02-02
  Administered 2015-01-30 – 2015-02-02 (×7): 4.5 g via INTRAVENOUS
  Filled 2015-01-30 (×9): qty 100

## 2015-01-30 MED ORDER — DEXTROMETHORPHAN POLISTIREX ER 30 MG/5ML PO SUER
15.0000 mg | Freq: Two times a day (BID) | ORAL | Status: DC
  Administered 2015-01-30 – 2015-02-03 (×8): 15 mg via ORAL
  Filled 2015-01-30 (×11): qty 5

## 2015-01-30 MED ORDER — METOPROLOL TARTRATE 25 MG PO TABS
12.5000 mg | ORAL_TABLET | Freq: Two times a day (BID) | ORAL | Status: DC
  Administered 2015-01-30 – 2015-01-31 (×2): 12.5 mg via ORAL
  Administered 2015-01-31: 09:00:00 via ORAL
  Administered 2015-02-01 – 2015-02-03 (×5): 12.5 mg via ORAL
  Filled 2015-01-30 (×8): qty 1

## 2015-01-30 MED ORDER — WARFARIN SODIUM 2.5 MG PO TABS: 2.5000 mg | ORAL_TABLET | ORAL | Status: DC

## 2015-01-30 MED ORDER — OXYBUTYNIN CHLORIDE 5 MG PO TABS
5.0000 mg | ORAL_TABLET | Freq: Three times a day (TID) | ORAL | Status: DC
  Administered 2015-01-30 – 2015-02-03 (×11): 5 mg via ORAL
  Filled 2015-01-30 (×11): qty 1

## 2015-01-30 MED ORDER — ACETAMINOPHEN 325 MG PO TABS
650.0000 mg | ORAL_TABLET | Freq: Four times a day (QID) | ORAL | Status: DC | PRN
  Administered 2015-02-01: 650 mg via ORAL
  Filled 2015-01-30: qty 2

## 2015-01-30 MED ORDER — FLECAINIDE ACETATE 50 MG PO TABS
50.0000 mg | ORAL_TABLET | Freq: Two times a day (BID) | ORAL | Status: DC
  Administered 2015-01-30 – 2015-02-03 (×8): 50 mg via ORAL
  Filled 2015-01-30 (×9): qty 1

## 2015-01-30 MED ORDER — QUETIAPINE FUMARATE 25 MG PO TABS
50.0000 mg | ORAL_TABLET | Freq: Every day | ORAL | Status: DC
  Administered 2015-01-30 – 2015-02-02 (×4): 50 mg via ORAL
  Filled 2015-01-30 (×4): qty 2

## 2015-01-30 MED ORDER — ACETAMINOPHEN 650 MG RE SUPP: 650.0000 mg | Freq: Four times a day (QID) | RECTAL | Status: DC | PRN

## 2015-01-30 MED ORDER — BENZONATATE 100 MG PO CAPS
100.0000 mg | ORAL_CAPSULE | Freq: Every day | ORAL | Status: DC
  Administered 2015-01-30 – 2015-02-02 (×4): 100 mg via ORAL
  Filled 2015-01-30 (×4): qty 1

## 2015-01-30 MED ORDER — GUAIFENESIN ER 600 MG PO TB12
600.0000 mg | ORAL_TABLET | Freq: Two times a day (BID) | ORAL | Status: DC
  Administered 2015-01-30 – 2015-02-03 (×7): 600 mg via ORAL
  Filled 2015-01-30 (×8): qty 1

## 2015-01-30 NOTE — ED Notes (Signed)
Called code sepsis to [place]

## 2015-01-30 NOTE — Progress Notes (Signed)
ANTIBIOTIC CONSULT NOTE - INITIAL  Pharmacy Consult for Zosyn dosing Indication: UTI/sepsis  Allergies  Allergen Reactions  . Codeine Nausea Only  . Vancomycin Other (See Comments)    Red mans syndrome     Patient Measurements: Height: 5\' 11"  (180.3 cm) Weight: 287 lb 14.7 oz (130.6 kg) IBW/kg (Calculated) : 75.3 Adjusted Body Weight: n/a  Vital Signs: BP: 137/78 mmHg (05/21 2121) Pulse Rate: 57 (05/21 2121) Intake/Output from previous day:   Intake/Output from this shift: Total I/O In: -  Out: 800 [Urine:800]  Labs:  Recent Labs  01/30/15 1510  WBC 8.3  HGB 12.3*  PLT 229  CREATININE 0.46*   Estimated Creatinine Clearance: 153.9 mL/min (by C-G formula based on Cr of 0.46). No results for input(s): VANCOTROUGH, VANCOPEAK, VANCORANDOM, GENTTROUGH, GENTPEAK, GENTRANDOM, TOBRATROUGH, TOBRAPEAK, TOBRARND, AMIKACINPEAK, AMIKACINTROU, AMIKACIN in the last 72 hours.   Microbiology: Recent Results (from the past 720 hour(s))  Urine culture     Status: None   Collection Time: 01/01/15  5:36 PM  Result Value Ref Range Status   Micro Text Report   Final       SOURCE: INDWELLING CATH    ORGANISM 1                >100,000 CFU/ML Pseudomonas aeruginosa   COMMENT                   -   ANTIBIOTIC                    ORG#1     CEFTAZIDIME                   S         CIPROFLOXACIN                 R         GENTAMICIN                    S         IMIPENEM                      S         LEVOFLOXACIN                  R         PIP/TAZOBACTAM                S           Urine culture     Status: None   Collection Time: 01/19/15  7:01 PM  Result Value Ref Range Status   Specimen Description URINE, CLEAN CATCH  Final   Special Requests URINE, CLEAN CATCH  Final   Culture   Final    URINE CULTURE WAS NEVER SET UP URINE HAS SINCE BEEN DISCARDED    Report Status 01/23/2015 FINAL  Final    Medical History: Past Medical History  Diagnosis Date  . Frontotemporal dementia    . Sleep apnea   . Hypertension   . Depression   . Neurogenic bladder   . Myelopathy   . Obesity   . Degeneration, intervertebral disc, cervical   . Elevated liver function tests   . Pulmonary embolus     on chronic anticoagulation  . Chronic UTI (urinary tract infection)   . Pernicious anemia   . Rosacea   . BPH (benign prostatic hypertrophy)   .  History of DVT (deep vein thrombosis)   . History of pulmonary embolism     currently on coumadin  . Anxiety   . New onset a-fib 2013    Medications:   Assessment: Blood and urine cx pending UA: LE(+) NO2(+) WBC TNTC CXR: atelectasis  Goal of Therapy:  Resolution of infection  Plan:  Zosyn 4.5 grams q 8 hours ordered for Pseudomonas risk of abx use as outpatient.  Sim Boast, PharmD, BCPS  01/30/2015,10:30 PM

## 2015-01-30 NOTE — ED Provider Notes (Signed)
[facility] Emergency Department Provider Note  ____________________________________________  Time seen: 1525  I have reviewed the triage vital signs and the nursing notes.  History and physical limited by the patient's baseline mental status and acute situation. History is provided by his parents.  HISTORY  Chief Complaint Altered Mental Status  altered mental status, lethargy, hypothermia.    HPI Kent [name] is a 49 y.o. male who has mental retardation and receives a great deal of care. He has a super pubic tube in place. He was seen in the emergency department on May 10 and diagnosed with a urinary tract infection. 2 days after that he was seen by his urologist, Dr. Jacqlyn Larsen, who changed the suprapubic tube and it is cystoscopy on him. He does undergo bladder rinses at home.  His parents, present in the emergency department, reports that he has been "going downhill" for the past 2-3 weeks. The father notes that he takes his temperature and has been getting lower and lower over the past few days. This morning it was 94 at home. He is not as responsive as she usually is and he is not acting as he usually does.  He remains with his eyes open and looks around but with less focus and less responsiveness to the parents.   Past Medical History  Diagnosis Date  . Frontotemporal dementia   . Sleep apnea   . Hypertension   . Depression   . Neurogenic bladder   . Myelopathy   . Obesity   . Degeneration, intervertebral disc, cervical   . Elevated liver function tests   . Pulmonary embolus     on chronic anticoagulation  . Chronic UTI (urinary tract infection)   . Pernicious anemia   . Rosacea   . BPH (benign prostatic hypertrophy)   . History of DVT (deep vein thrombosis)   . History of pulmonary embolism     currently on coumadin  . Anxiety   . New onset a-fib 2013    Patient Active Problem List   Diagnosis Date Noted  . A-fib 08/21/2012  .  Dementia 08/21/2012  . Hypertension 08/21/2012  . Hyperlipidemia 08/21/2012  . History of pulmonary embolism 08/21/2012    Past Surgical History  Procedure Laterality Date  . Suprapubic catheter insertion    . Kidney stone surgery      Current Outpatient Rx  Name  Route  Sig  Dispense  Refill  . benzonatate (TESSALON) 100 MG capsule   Oral   Take 100 mg by mouth at bedtime.         . citalopram (CELEXA) 20 MG tablet   Oral   Take 20 mg by mouth 2 (two) times daily.         . Cyanocobalamin (VITAMIN B-12 IJ)   Injection   Inject 500 mcg as directed.         Marland Kitchen dextromethorphan (DELSYM) 30 MG/5ML liquid   Oral   Take 15 mg by mouth 2 (two) times daily.         Marland Kitchen econazole nitrate 1 % cream   Topical   Apply 1 application topically as directed.         . flecainide (TAMBOCOR) 50 MG tablet      TAKE 1 TABLET (50 MG TOTAL) BY MOUTH 2 (TWO) TIMES DAILY.   60 tablet   5   . flecainide (TAMBOCOR) 50 MG tablet      TAKE 1 TABLET (50 MG TOTAL)  BY MOUTH 2 (TWO) TIMES DAILY. Patient not taking: Reported on 01/19/2015   60 tablet   3   . fluticasone (FLONASE) 50 MCG/ACT nasal spray   Each Nare   Place 1 spray into both nostrils 2 (two) times daily.         Marland Kitchen guaiFENesin (MUCINEX) 600 MG 12 hr tablet   Oral   Take 600 mg by mouth 2 (two) times daily as needed.         Marland Kitchen HYDROcodone-acetaminophen (NORCO/VICODIN) 5-325 MG per tablet   Oral   Take 1 tablet by mouth every 6 (six) hours as needed for moderate pain.         Marland Kitchen ketoconazole (NIZORAL) 2 % shampoo   Topical   Apply 1 application topically 2 (two) times a week.         Marland Kitchen levofloxacin (LEVAQUIN) 500 MG tablet   Oral   Take 500 mg by mouth daily.         Marland Kitchen loratadine (CLARITIN) 10 MG tablet   Oral   Take 10 mg by mouth daily.         Marland Kitchen LORazepam (ATIVAN) 0.5 MG tablet   Oral   Take 0.5 mg by mouth 2 (two) times daily.         Marland Kitchen lovastatin (MEVACOR) 40 MG tablet   Oral   Take 40  mg by mouth at bedtime.         . metoprolol tartrate (LOPRESSOR) 25 MG tablet      TAKE 1 TABLET (25 MG TOTAL) BY MOUTH 2 (TWO) TIMES DAILY.   60 tablet   6   . metoprolol tartrate (LOPRESSOR) 25 MG tablet      TAKE 1 TABLET (25 MG TOTAL) BY MOUTH 2 (TWO) TIMES DAILY. Patient not taking: Reported on 01/19/2015   60 tablet   3   . metroNIDAZOLE (METROGEL) 1 % gel   Topical   Apply 1 application topically daily.         . NON FORMULARY      cpap daily at bedtime.         Marland Kitchen omeprazole (PRILOSEC) 40 MG capsule   Oral   Take 40 mg by mouth daily.         Marland Kitchen oxybutynin (DITROPAN) 5 MG tablet   Oral   Take 5 mg by mouth 3 (three) times daily.       6   . Polyethylene Glycol 3350 (MIRALAX PO)   Oral   Take by mouth daily.         . QUEtiapine (SEROQUEL) 50 MG tablet   Oral   Take 50 mg by mouth at bedtime.         Marland Kitchen warfarin (COUMADIN) 5 MG tablet   Oral   Take 2.5-5 mg by mouth daily. Alternate 2.5 and 5 mg.           Allergies Codeine and Vancomycin  Family History  Problem Relation Age of Onset  . Hypertension Father   . Hyperlipidemia Father   . Hypertension Mother     Social History History  Substance Use Topics  . Smoking status: Never Smoker   . Smokeless tobacco: Not on file  . Alcohol Use: No    Review of Systems Review of systems unable due to the patient being noncommunicative. ____________________________________________   PHYSICAL EXAM:  VITAL SIGNS: ED Triage Vitals  Enc Vitals Group     BP --  Pulse --      Resp --      Temp --      Temp src --      SpO2 --      Weight --      Height --      Head Cir --      Peak Flow --      Pain Score --      Pain Loc --      Pain Edu? --      Excl. in [place]? --     Constitutional: Eyes open. No acute distress. No respiratory distress. His eyes wander. He does not make eye contact. There is no communication. ENT   Head: Normocephalic and atraumatic.   Nose: No  congestion/rhinnorhea.   Mouth/Throat: Mucous membranes are moist. Cardiovascular: Heart rate from 55-65. regular rhythm. Respiratory: Normal respiratory effort without tachypnea. Breath sounds are clear and equal bilaterally. No wheezes/rales/rhonchi. He does have some upper airway noise with his breathing.  Gastrointestinal: Soft and nontender. No distention. There is a suprapubic tube in place. Musculoskeletal: Nontender with normal range of motion in all extremities.  No noted edema. Neurologic:  Eyes open and wandering around but does not make eye contact. There is no significant interaction or communication. He is nonverbal.  Skin:  Skin is warm, dry. No rash noted. Psychiatric: Nonverbal, noninteractive. Further psychiatric evaluation is deferred. ____________________________________________    LABS (pertinent positives/negatives)  CBC is normal with a white blood cell count of 8.3 and hemoglobin of 12.3. Metabolic panel is normal with a potassium of 4.0 and normal renal function. Urinalysis shows signs of infection with white blood cells and red blood cells both too numerous to count and positive nitrite. Blood cultures are pending Dr. Hunt Oris is 1.1. TSH is 3.965.  ____________________________________________     ____________________________________________    RADIOLOGY  Chest x-ray shows some atelectasis otherwise no acute process. CT head IMPRESSION: 1. No acute intracranial pathology seen on CT. 2. Moderate cortical volume loss and scattered small vessel ischemic microangiopathy. 3. 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  ____________________________________________   Critical Care performed: Patient was noted hypothermia with suspected sepsis requiring IV antibiotics critical care time of 35 minutes   ____________________________________________   INITIAL IMPRESSION / ASSESSMENT AND PLAN / ED  COURSE  The patient is hypothermic with a temperature of 95 rectally. With this and his altered mental status, change in behavior, I believe the patient is septic. Given his history of urinary tract infection and a suprapubic tube, a UTI is his most likely source. He was on Levaquin up until recently. We will start IV antibiotics. He is allergic to vancomycin but we will give him cefepime.  ____________________________________________   FINAL CLINICAL IMPRESSION(S) / ED DIAGNOSES  Final diagnoses:  Sepsis due to urinary tract infection  Dementia, without behavioral disturbance  Hypothermia, initial encounter      Ahmed Prima, MD 01/30/15 1842

## 2015-01-30 NOTE — H&P (Signed)
[facility]    MR#:  [id number]  DATE OF BIRTH:  1966-03-24  DATE OF ADMISSION:  01/30/2015  PRIMARY CARE PHYSICIAN: Ashok Norris, MD   REQUESTING/REFERRING PHYSICIAN: Dr. Thomasene Lot  CHIEF COMPLAINT:   Chief Complaint  Patient presents with  . Altered Mental Status    HISTORY OF PRESENT ILLNESS:  Kent [name] is a 49 y.o. male who with past medical history of chronic progressive neurodegenerative condition without a specific diagnosis who is bedbound, nonverbal but does communicate with facial expression and nonverbal sounds. He is brought in today by his parents who reports several weeks of decreased attentiveness, decreased appetite and decreasing temperature. This morning his temperature at home was 95. His mother reports that he has been eating less and less and has been holding food in his mouth rather than swallowing it for several weeks. She also reports that he has not been responding to family members, sleeping more than usual and staring at the ceiling for long periods of time. He has a chronic suprapubic catheter to neurogenic bladder, on presentation to the emergency room he was found to have a urinary tract infection and is persistently thermic with temperature of 95.  Of note he saw his urologist, Dr. Jacqlyn Larsen 1 week ago and at that time his suprapubic catheter was changed. He had cystoscopy. He was started on suppressive nitrofurantoin. His parents do not know if he had a urinary tract infection at that time. Past several weeks his parents have been performing bladder irrigation 3 times a day with Renacidin. They have not noted any hematuria, odor, discharge from the catheter site.  PAST MEDICAL HISTORY:   Past Medical History  Diagnosis Date  . Frontotemporal dementia   . Sleep apnea   . Hypertension   . Depression   . Neurogenic bladder   . Myelopathy   . Obesity   .  Degeneration, intervertebral disc, cervical   . Elevated liver function tests   . Pulmonary embolus     on chronic anticoagulation  . Chronic UTI (urinary tract infection)   . Pernicious anemia   . Rosacea   . BPH (benign prostatic hypertrophy)   . History of DVT (deep vein thrombosis)   . History of pulmonary embolism     currently on coumadin  . Anxiety   . New onset a-fib 2013    PAST SURGICAL HISTORY:   Past Surgical History  Procedure Laterality Date  . Suprapubic catheter insertion    . Kidney stone surgery      SOCIAL HISTORY:   History  Substance Use Topics  . Smoking status: Never Smoker   . Smokeless tobacco: Not on file  . Alcohol Use: No    FAMILY HISTORY:   Family History  Problem Relation Age of Onset  . Hypertension Father   . Hyperlipidemia Father   . Hypertension Mother     DRUG ALLERGIES:   Allergies  Allergen Reactions  . Codeine Nausea Only  . Vancomycin Other (See Comments)    Red mans syndrome     REVIEW OF SYSTEMS:   ROS unable to obtain review of systems due to patient's nonverbal status.  MEDICATIONS AT HOME:   Prior to Admission medications   Medication Sig Start Date End Date Taking? Authorizing Provider  benzonatate (TESSALON) 100 MG capsule Take 100 mg by mouth at bedtime.   Yes Historical Provider, MD  citalopram (CELEXA) 20 MG tablet  Take 20 mg by mouth 2 (two) times daily.   Yes Historical Provider, MD  clindamycin (CLEOCIN T) 1 % external solution Apply 1 application topically 2 (two) times daily. 12/05/14  Yes Historical Provider, MD  Cyanocobalamin (VITAMIN B-12 IJ) Inject 500 mLs as directed every 30 (thirty) days.    Yes Historical Provider, MD  dextromethorphan (DELSYM) 30 MG/5ML liquid Take 15 mg by mouth 2 (two) times daily.   Yes Historical Provider, MD  flecainide (TAMBOCOR) 50 MG tablet TAKE 1 TABLET (50 MG TOTAL) BY MOUTH 2 (TWO) TIMES DAILY. 10/12/14  Yes Minna Merritts, MD  fluticasone (FLONASE) 50 MCG/ACT  nasal spray Place 1 spray into both nostrils 2 (two) times daily.   Yes Historical Provider, MD  guaiFENesin (MUCINEX) 600 MG 12 hr tablet Take 600 mg by mouth 2 (two) times daily.    Yes Historical Provider, MD  ipratropium-albuterol (DUONEB) 0.5-2.5 (3) MG/3ML SOLN Inhale 3 mLs into the lungs daily as needed. 08/13/14  Yes Historical Provider, MD  ketoconazole (NIZORAL) 2 % shampoo Apply 1 application topically once a week. on Fridays.   Yes Historical Provider, MD  loratadine (CLARITIN) 10 MG tablet Take 10 mg by mouth daily.   Yes Historical Provider, MD  LORazepam (ATIVAN) 0.5 MG tablet Take 0.5 mg by mouth 2 (two) times daily.   Yes Historical Provider, MD  lovastatin (MEVACOR) 40 MG tablet Take 40 mg by mouth every evening.    Yes Historical Provider, MD  metoprolol tartrate (LOPRESSOR) 25 MG tablet TAKE 1 TABLET (25 MG TOTAL) BY MOUTH 2 (TWO) TIMES DAILY. Patient taking differently: TAKE 0.5 TABLET (25 MG TOTAL) BY MOUTH 2 (TWO) TIMES DAILY. 12/28/14  Yes Minna Merritts, MD  nitrofurantoin (MACRODANTIN) 100 MG capsule Take 100 mg by mouth daily. 01/21/15  Yes Historical Provider, MD  NON FORMULARY cpap daily at bedtime.   Yes Historical Provider, MD  omeprazole (PRILOSEC) 40 MG capsule Take 40 mg by mouth daily before breakfast.    Yes Historical Provider, MD  oxybutynin (DITROPAN) 5 MG tablet Take 5 mg by mouth 3 (three) times daily.  12/07/14  Yes Historical Provider, MD  Polyethylene Glycol 3350 (MIRALAX PO) Take 17 g by mouth every morning.    Yes Historical Provider, MD  QUEtiapine (SEROQUEL) 50 MG tablet Take 50 mg by mouth at bedtime.   Yes Historical Provider, MD  RENACIDIN irrigation 30 mLs by Bladder Irrigation route 3 (three) times daily. 01/22/15  Yes Historical Provider, MD  warfarin (COUMADIN) 5 MG tablet Take 2.5-5 mg by mouth every other day. Take 0.5 tab (2.5mg ) orally once a day then alternate with  1 tablet (5mg ) orally once a day.   Yes Historical Provider, MD  flecainide  (TAMBOCOR) 50 MG tablet TAKE 1 TABLET (50 MG TOTAL) BY MOUTH 2 (TWO) TIMES DAILY.    Minna Merritts, MD  metoprolol tartrate (LOPRESSOR) 25 MG tablet TAKE 1 TABLET (25 MG TOTAL) BY MOUTH 2 (TWO) TIMES DAILY. Patient not taking: Reported on 01/19/2015 01/11/15   Minna Merritts, MD      VITAL SIGNS:  Blood pressure 127/85, pulse 53, resp. rate 15, height 5\' 11"  (1.803 m), weight 130.6 kg (287 lb 14.7 oz), SpO2 97 %.  PHYSICAL EXAMINATION:  GENERAL:  49 y.o.-year-old patient lying in the bed with no acute distress. Obese. EYES: Pupils equal, round, reactive to light and accommodation. No scleral icterus. Extraocular muscles intact.  HEENT: Head atraumatic, normocephalic. Oropharynx and nasopharynx clear. Fair dentition, mucous  membranes dry NECK:  Supple, no jugular venous distention. No thyroid enlargement, no tenderness.  LUNGS: Normal breath sounds bilaterally, no wheezing, rales, rhonchi or crepitation. No use of accessory muscles of respiration. He does have occasional breath-holding and pursed lip breathing though this seems to be a reflex or volitional CARDIOVASCULAR: S1, S2 normal. No murmurs, rubs, or gallops. Irregular ABDOMEN: Soft, nontender, distended and tympanic. Bowel sounds present. No organomegaly or mass.  EXTREMITIES: +1 edema, cyanosis, or clubbing.  NEUROLOGIC: Not participate in neurologic examination, seems to make eye contact but only briefly, eyes seem to be scanning horizontally, does not respond to voice, does not follow commands, no movement of extremities noted, moves head side to side PSYCHIATRIC: Unable to assess SKIN: No obvious rash, lesion, or ulcer.   LABORATORY PANEL:   CBC  Recent Labs Lab 01/30/15 1510  WBC 8.3  HGB 12.3*  HCT 38.7*  PLT 229   ------------------------------------------------------------------------------------------------------------------  Chemistries   Recent Labs Lab 01/30/15 1510  NA 140  K 4.0  CL 104  CO2 30   GLUCOSE 86  BUN 11  CREATININE 0.46*  CALCIUM 9.3  AST 38  ALT 44  ALKPHOS 81  BILITOT 0.6   ------------------------------------------------------------------------------------------------------------------  Cardiac Enzymes No results for input(s): TROPONINI in the last 168 hours. ------------------------------------------------------------------------------------------------------------------  RADIOLOGY:  Ct Head Wo Contrast  01/30/2015   CLINICAL DATA:  Acute onset of altered mental status. Loss of appetite. Initial encounter.  EXAM: CT HEAD WITHOUT CONTRAST  TECHNIQUE: Contiguous axial images were obtained from the base of the skull through the vertex without intravenous contrast.  COMPARISON:  CT of the head performed 12/08/2014  FINDINGS: There is no evidence of acute infarction, mass lesion, or intra- or extra-axial hemorrhage on CT.  Prominence of the ventricles and sulci reflects moderate cortical volume loss. Mild cerebellar atrophy is noted. Scattered periventricular and subcortical white matter change likely reflects small vessel ischemic microangiopathy. A small chronic lacunar infarct is noted within the right basal ganglia, and a small chronic lacunar infarct is noted at the left cerebellar hemisphere.  The brainstem and fourth ventricle are within normal limits. The cerebral hemispheres demonstrate grossly normal gray-white differentiation. No mass effect or midline shift is seen.  There is no evidence of fracture; visualized osseous structures are unremarkable in appearance. The orbits are within normal limits. The paranasal sinuses and mastoid air cells are well-aerated. No significant soft tissue abnormalities are seen.  IMPRESSION: 1. No acute intracranial pathology seen on CT. 2. Moderate cortical volume loss and scattered small vessel ischemic microangiopathy. 3. 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    Electronically Signed   By: Garald Balding M.D.   On: 01/30/2015 18:03   Dg Chest Port 1 View  01/30/2015   CLINICAL DATA:  Altered mental status. Cough, congestion for 5 days.  EXAM: PORTABLE CHEST - 1 VIEW  COMPARISON:  01/19/2015  FINDINGS: Low lung volumes. Right basilar atelectasis is similar to prior study. Minimal left base atelectasis. Mild cardiomegaly. No visible effusions or acute bony abnormality.  IMPRESSION: Low lung volumes, bibasilar atelectasis.   Electronically Signed   By: Rolm Baptise M.D.   On: 01/30/2015 15:59    EKG:   Orders placed or performed during the hospital encounter of 01/19/15  . ED EKG  . ED EKG  . EKG    IMPRESSION AND PLAN:   Principal Problem:   Sepsis Active Problems:   Neurogenic bladder  Chronic suprapubic catheter   Neurodegenerative disorder   GERD (gastroesophageal reflux disease)   UTI (lower urinary tract infection)  #1 sepsis: Meets criteria with hypothermia, hypotension and urinary tract infection. No end organ damage. No leukocytosis. He has received a 1 L bolus in the emergency room with good improvement in blood pressure. He has been started on cefepime. Blood and urine cultures are pending. Chest x-ray negative for pneumonia.  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 Urine and blood cultures are pending. I have started Zosyn based on sensitivities of the previous culture. He has a chronic suprapubic catheter and has been on suppressive nitrofurantoin for 1 week. He has had a recent catheter exchange and cystoscopy by his urologist. On Monday will attempt to contact Dr. Jacqlyn Larsen his urologist. In reviewing prior UAs he always has white blood cells in the urine but today is TNTC. Continue oxybutynin for neurogenic bladder  #3 altered mental status: Patient family reports decreased mentation and alertness. This is likely due to sepsis. We did discuss the possibility  of progression of his neurodegenerative condition as well as the possibility of depression. Patient has been admitted on multiple occasions. He seems to have an overall decline in his functional status. I have ordered a palliative care consultation for goals of care. He may be a good candidate for hospice. Our  Palliative care team is familiar with this patient. He is a DO NOT RESUSCITATE  #4 GERD: Continue PPI  #5 history of pulmonary embolism and DVT: Continue with warfarin per pharmacy  #6 atrial fibrillation: Continue metoprolol and flecainide. Monitor on telemetry. He has had some fluctuation in his heart rate with some bradycardia during the admission process. If this continues would hold these rate control medications.  All the records are reviewed and case discussed with ED provider. Management plans discussed with the patient, family and they are in agreement.  CODE STATUS: DO NOT RESUSCITATE  TOTAL TIME TAKING CARE OF THIS PATIENT: 55 minutes.    Myrtis Ser M.D on 01/30/2015 at 8:52 PM  Between 7am to 6pm - Pager - [phone]  After 6pm go to [email or web address] - password [email or web address] [email or web address]  [facility] [facility]  Office  [phone]  CC: Primary care physician; Ashok Norris, MD

## 2015-01-30 NOTE — ED Notes (Signed)
Pt arrived via EMS from home. EMS stated he "hasn't been acting right for a week". Family states he has a low temperature and usually does this when he gets sick. Pt's temperature 95.2. Presents with suprapubic catheter. Pt alert at times but not oriented.

## 2015-01-30 NOTE — ED Notes (Signed)
BEHAVIORAL HEALTH ROUNDING Patient sleeping: No. Patient alert and oriented: yes Behavior appropriate: Yes.  ; If no, describe:  Nutrition and fluids offered: No Toileting and hygiene offered: No Sitter present: yes Law enforcement present: Yes  

## 2015-01-31 LAB — BASIC METABOLIC PANEL
Anion gap: 7 (ref 5–15)
BUN: 10 mg/dL (ref 6–20)
CALCIUM: 8.9 mg/dL (ref 8.9–10.3)
CO2: 27 mmol/L (ref 22–32)
Chloride: 106 mmol/L (ref 101–111)
Creatinine, Ser: 0.43 mg/dL — ABNORMAL LOW (ref 0.61–1.24)
GFR calc Af Amer: >60 mL/min (ref >60)
Glucose, Bld: 76 mg/dL (ref 65–99)
Potassium: 3.9 mmol/L (ref 3.5–5.1)
Sodium: 140 mmol/L (ref 135–145)

## 2015-01-31 LAB — CBC
HCT: 36.3 % — ABNORMAL LOW (ref 40.0–52.0)
Hemoglobin: 11.7 g/dL — ABNORMAL LOW (ref 13.0–18.0)
MCH: 28.3 pg (ref 26.0–34.0)
MCHC: 32.2 g/dL (ref 32.0–36.0)
MCV: 88 fL (ref 80.0–100.0)
Platelets: 223 10*3/uL (ref 150–440)
RBC: 4.13 MIL/uL — AB (ref 4.40–5.90)
RDW: 16.7 % — AB (ref 11.5–14.5)
WBC: 11 10*3/uL — AB (ref 3.8–10.6)

## 2015-01-31 MED ORDER — DIPHENHYDRAMINE HCL 25 MG PO CAPS
25.0000 mg | ORAL_CAPSULE | Freq: Once | ORAL | Status: AC
  Administered 2015-01-31: 25 mg via ORAL
  Filled 2015-01-31: qty 1

## 2015-01-31 MED ORDER — WARFARIN SODIUM 2.5 MG PO TABS
5.0000 mg | ORAL_TABLET | ORAL | Status: DC
  Administered 2015-01-31 – 2015-02-02 (×2): 5 mg via ORAL
  Filled 2015-01-31 (×3): qty 2

## 2015-01-31 MED ORDER — SODIUM CHLORIDE 0.9 % IV SOLN
INTRAVENOUS | Status: DC
  Administered 2015-01-31: 11:00:00 via INTRAVENOUS

## 2015-01-31 MED ORDER — WARFARIN SODIUM 2.5 MG PO TABS
2.5000 mg | ORAL_TABLET | ORAL | Status: DC
  Administered 2015-02-01: 2.5 mg via ORAL

## 2015-01-31 NOTE — Progress Notes (Signed)
Kent [name]    MR#:  [id number]  DATE OF BIRTH:  10-Oct-1965  SUBJECTIVE:  CHIEF COMPLAINT:   Chief Complaint  Patient presents with  . Altered Mental Status   Much more alert and responsive this am  REVIEW OF SYSTEMS:   ROS unable to perform due to non verbal patient  DRUG ALLERGIES:   Allergies  Allergen Reactions  . Codeine Nausea Only  . Vancomycin Other (See Comments)    Red mans syndrome     VITALS:  Blood pressure 116/82, pulse 66, temperature 96.9 F (36.1 C), resp. rate 19, height 5\' 11"  (1.803 m), weight 130.6 kg (287 lb 14.7 oz), SpO2 96 %.  PHYSICAL EXAMINATION:  GENERAL: 49 y.o.-year-old patient lying in the bed with no acute distress. Obese. EYES: Pupils equal, round, reactive to light and accommodation. No scleral icterus. Extraocular muscles intact.  HEENT: Head atraumatic, normocephalic. Oropharynx and nasopharynx clear. Fair dentition, mucous membranes dry NECK: Supple, no jugular venous distention. No thyroid enlargement, no tenderness.  LUNGS: Normal breath sounds bilaterally, no wheezing, rales, rhonchi or crepitation. No use of accessory muscles of respiration. CARDIOVASCULAR: S1, S2 normal. No murmurs, rubs, or gallops. Irregular ABDOMEN: Soft, nontender, distended and tympanic. Bowel sounds present. No organomegaly or mass.  EXTREMITIES: +1 edema, cyanosis, or clubbing.  NEUROLOGIC: Not participate in neurologic examination, more eye contact today, some positive facial expressions, more movement, intentional movement of right hand PSYCHIATRIC: Unable to assess SKIN: No obvious rash, lesion, or ulcer.    LABORATORY PANEL:   CBC  Recent Labs Lab 01/31/15 0505  WBC 11.0*  HGB 11.7*  HCT 36.3*  PLT 223   ------------------------------------------------------------------------------------------------------------------  Chemistries   Recent Labs Lab  01/30/15 1510 01/31/15 0505  NA 140 140  K 4.0 3.9  CL 104 106  CO2 30 27  GLUCOSE 86 76  BUN 11 10  CREATININE 0.46* 0.43*  CALCIUM 9.3 8.9  AST 38  --   ALT 44  --   ALKPHOS 81  --   BILITOT 0.6  --    ------------------------------------------------------------------------------------------------------------------  Cardiac Enzymes No results for input(s): TROPONINI in the last 168 hours. ------------------------------------------------------------------------------------------------------------------  RADIOLOGY:  Dg Abd 1 View  01/30/2015   CLINICAL DATA:  Altered mental status  EXAM: ABDOMEN - 1 VIEW  COMPARISON:  Abdominal radiographs dated 12/12/2014  FINDINGS: Nonobstructive bowel gas pattern.  Mild gaseous distention of sigmoid colon in the mid abdomen. This is improved from prior radiographs and likely reflects gaseous distention of nondependent colon when correlating with prior CT.  Degenerative changes of the bilateral hips.  IMPRESSION: No evidence of bowel obstruction.  Mild gaseous distention of nondependent sigmoid colon.   Electronically Signed   By: Julian Hy M.D.   On: 01/30/2015 21:48   Ct Head Wo Contrast  01/30/2015   CLINICAL DATA:  Acute onset of altered mental status. Loss of appetite. Initial encounter.  EXAM: CT HEAD WITHOUT CONTRAST  TECHNIQUE: Contiguous axial images were obtained from the base of the skull through the vertex without intravenous contrast.  COMPARISON:  CT of the head performed 12/08/2014  FINDINGS: There is no evidence of acute infarction, mass lesion, or intra- or extra-axial hemorrhage on CT.  Prominence of the ventricles and sulci reflects moderate cortical volume loss. Mild cerebellar atrophy is noted. Scattered periventricular and subcortical white matter change likely reflects small vessel ischemic microangiopathy. A small chronic lacunar infarct is noted within  the right basal ganglia, and a small chronic lacunar infarct is noted  at the left cerebellar hemisphere.  The brainstem and fourth ventricle are within normal limits. The cerebral hemispheres demonstrate grossly normal gray-white differentiation. No mass effect or midline shift is seen.  There is no evidence of fracture; visualized osseous structures are unremarkable in appearance. The orbits are within normal limits. The paranasal sinuses and mastoid air cells are well-aerated. No significant soft tissue abnormalities are seen.  IMPRESSION: 1. No acute intracranial pathology seen on CT. 2. Moderate cortical volume loss and scattered small vessel ischemic microangiopathy. 3. 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   Electronically Signed   By: Garald Balding M.D.   On: 01/30/2015 18:03   Dg Chest Port 1 View  01/30/2015   CLINICAL DATA:  Altered mental status. Cough, congestion for 5 days.  EXAM: PORTABLE CHEST - 1 VIEW  COMPARISON:  01/19/2015  FINDINGS: Low lung volumes. Right basilar atelectasis is similar to prior study. Minimal left base atelectasis. Mild cardiomegaly. No visible effusions or acute bony abnormality.  IMPRESSION: Low lung volumes, bibasilar atelectasis.   Electronically Signed   By: Rolm Baptise M.D.   On: 01/30/2015 15:59    EKG:   Orders placed or performed during the hospital encounter of 01/19/15  . ED EKG  . ED EKG  . EKG    ASSESSMENT AND PLAN:   Principal Problem:   Sepsis Active Problems:   Neurogenic bladder   Chronic suprapubic catheter   Neurodegenerative disorder   GERD (gastroesophageal reflux disease)   UTI (lower urinary tract infection)  #1 sepsis: Mets criteria with hypothermia, hypotension and urinary tract infection. - improving, temp increasing, BP increasing  No end organ damage. No leukocytosis. He has received a 1 L bolus in the emergency room with good improvement in blood pressure. He has been started on cefepime. Blood and urine cultures are pending.  Chest x-ray negative for pneumonia.  #2 urinary tract infection:  - continue zosyn based on history of urinary tract infection with multidrug resistant pseudomonas.  - Urine and blood cultures are pending.  - He has a chronic suprapubic catheter and has been on suppressive nitrofurantoin for 1 week. He has had a recent catheter exchange and cystoscopy by his urologist. On Monday will attempt to contact Dr. Jacqlyn Larsen his urologist. In reviewing prior UAs he always has white blood cells in the urine but today is TNTC. Continue oxybutynin for neurogenic bladder  #3 altered mental status: - improved from yesterday - Patient family reports decreased mentation and alertness over past weeks. We did discuss the possibility of progression of his neurodegenerative condition as well as the possibility of depression. Patient has been admitted on multiple occasions. He seems to have an overall decline in his functional status. His mother feels that he has no quality of life currently. I have ordered a palliative care consultation for goals of care. He may be a good candidate for hospice. Our Palliative care team is familiar with this patient. He is a DO NOT RESUSCITATE. Will also order speech therarpy as he is having trouble with swallow.  #4 GERD: Continue PPI  #5 history of pulmonary embolism and DVT: Continue with warfarin per pharmacy  #6 atrial fibrillation: Continue metoprolol and flecainide. Monitor on telemetry. He did some fluctuation in his heart rate with some bradycardia during the admission process, seems improved now. If this continues would hold these rate control  medications.     All the records are reviewed and case discussed with Care Management/Social Workerr. Management plans discussed with the patient, family and they are in agreement.  CODE STATUS: DNR  TOTAL TIME TAKING CARE OF THIS PATIENT: 35 minutes.   POSSIBLE D/C IN 2 DAYS, DEPENDING ON CLINICAL CONDITION.   Myrtis Ser  M.D on 01/31/2015 at 9:58 AM  Between 7am to 6pm - Pager - [phone]  After 6pm go to [email or web address] - password [email or web address] [facility]  [facility]  Office  [phone]  CC: Primary care physician; Ashok Norris, MD

## 2015-02-01 DIAGNOSIS — N390 Urinary tract infection, site not specified: Secondary | ICD-10-CM

## 2015-02-01 DIAGNOSIS — R4182 Altered mental status, unspecified: Secondary | ICD-10-CM

## 2015-02-01 DIAGNOSIS — Z515 Encounter for palliative care: Secondary | ICD-10-CM

## 2015-02-01 DIAGNOSIS — Z79899 Other long term (current) drug therapy: Secondary | ICD-10-CM

## 2015-02-01 DIAGNOSIS — I10 Essential (primary) hypertension: Secondary | ICD-10-CM

## 2015-02-01 DIAGNOSIS — A419 Sepsis, unspecified organism: Principal | ICD-10-CM

## 2015-02-01 LAB — CBC
HCT: 36.5 % — ABNORMAL LOW (ref 40.0–52.0)
Hemoglobin: 11.7 g/dL — ABNORMAL LOW (ref 13.0–18.0)
MCH: 27.7 pg (ref 26.0–34.0)
MCHC: 32 g/dL (ref 32.0–36.0)
MCV: 86.5 fL (ref 80.0–100.0)
PLATELETS: 267 10*3/uL (ref 150–440)
RBC: 4.22 MIL/uL — ABNORMAL LOW (ref 4.40–5.90)
RDW: 16.7 % — AB (ref 11.5–14.5)
WBC: 16.1 10*3/uL — AB (ref 3.8–10.6)

## 2015-02-01 LAB — PROTIME-INR
INR: 2.85
PROTHROMBIN TIME: 30 s — AB (ref 11.4–15.0)

## 2015-02-01 MED ORDER — POLYETHYLENE GLYCOL 3350 17 G PO PACK
17.0000 g | PACK | Freq: Every day | ORAL | Status: DC
  Administered 2015-02-02 – 2015-02-03 (×2): 17 g via ORAL
  Filled 2015-02-01 (×2): qty 1

## 2015-02-01 NOTE — Progress Notes (Signed)
[facility] [place] NAME: Kent [name]    MR#:  [id number]  DATE OF BIRTH:  1965-10-31  SUBJECTIVE:   Continues to be alert, making eye contact, seems to be in good spirits  REVIEW OF SYSTEMS:   ROS unable to perform due to non verbal patient  DRUG ALLERGIES:   Allergies  Allergen Reactions  . Codeine Nausea Only  . Vancomycin Other (See Comments)    Red mans syndrome     VITALS:  Blood pressure 109/69, pulse 86, temperature 98.9 F (37.2 C), temperature source Oral, resp. rate 18, height $RemoveBe'5\' 11"'[id number]$  (1.803 m), weight 130.182 kg (287 lb), SpO2 94 %.  PHYSICAL EXAMINATION:  GENERAL: 49 y.o.-year-old patient lying in the bed with no acute distress. Obese. EYES: Pupils equal, round, reactive to light and accommodation. No scleral icterus. Extraocular muscles intact.  HEENT: Head atraumatic, normocephalic. Oropharynx and nasopharynx clear. Fair dentition, mucous membranes dry NECK: Supple, no jugular venous distention. No thyroid enlargement, no tenderness.  LUNGS: Normal breath sounds bilaterally, no wheezing, rales, rhonchi or crepitation. No use of accessory muscles of respiration. CARDIOVASCULAR: S1, S2 normal. No murmurs, rubs, or gallops. Irregular ABDOMEN: Soft, nontender, distended and tympanic. Bowel sounds present. No organomegaly or mass.  EXTREMITIES: +1 edema, cyanosis, or clubbing.  NEUROLOGIC: Not participate in neurologic examination, good eye contact today, some positive facial expressions, more movement, intentional movement of right hand PSYCHIATRIC: Unable to assess SKIN: No obvious rash, lesion, or ulcer.    LABORATORY PANEL:   CBC  Recent Labs Lab 02/01/15 1001  WBC 16.1*  HGB 11.7*  HCT 36.5*  PLT 267   ------------------------------------------------------------------------------------------------------------------  Chemistries   Recent Labs Lab 01/30/15 1510 01/31/15 0505  NA 140 140   K 4.0 3.9  CL 104 106  CO2 30 27  GLUCOSE 86 76  BUN 11 10  CREATININE 0.46* 0.43*  CALCIUM 9.3 8.9  AST 38  --   ALT 44  --   ALKPHOS 81  --   BILITOT 0.6  --    ------------------------------------------------------------------------------------------------------------------  Cardiac Enzymes No results for input(s): TROPONINI in the last 168 hours. ------------------------------------------------------------------------------------------------------------------  RADIOLOGY:  Dg Abd 1 View  01/30/2015   CLINICAL DATA:  Altered mental status  EXAM: ABDOMEN - 1 VIEW  COMPARISON:  Abdominal radiographs dated 12/12/2014  FINDINGS: Nonobstructive bowel gas pattern.  Mild gaseous distention of sigmoid colon in the mid abdomen. This is improved from prior radiographs and likely reflects gaseous distention of nondependent colon when correlating with prior CT.  Degenerative changes of the bilateral hips.  IMPRESSION: No evidence of bowel obstruction.  Mild gaseous distention of nondependent sigmoid colon.   Electronically Signed   By: Julian Hy M.D.   On: 01/30/2015 21:48   Ct Head Wo Contrast  01/30/2015   CLINICAL DATA:  Acute onset of altered mental status. Loss of appetite. Initial encounter.  EXAM: CT HEAD WITHOUT CONTRAST  TECHNIQUE: Contiguous axial images were obtained from the base of the skull through the vertex without intravenous contrast.  COMPARISON:  CT of the head performed 12/08/2014  FINDINGS: There is no evidence of acute infarction, mass lesion, or intra- or extra-axial hemorrhage on CT.  Prominence of the ventricles and sulci reflects moderate cortical volume loss. Mild cerebellar atrophy is noted. Scattered periventricular and subcortical white matter change likely reflects small vessel ischemic microangiopathy. A small chronic lacunar infarct is noted within the right basal ganglia, and a small chronic lacunar  infarct is noted at the left cerebellar hemisphere.  The  brainstem and fourth ventricle are within normal limits. The cerebral hemispheres demonstrate grossly normal gray-white differentiation. No mass effect or midline shift is seen.  There is no evidence of fracture; visualized osseous structures are unremarkable in appearance. The orbits are within normal limits. The paranasal sinuses and mastoid air cells are well-aerated. No significant soft tissue abnormalities are seen.  IMPRESSION: 1. No acute intracranial pathology seen on CT. 2. Moderate cortical volume loss and scattered small vessel ischemic microangiopathy. 3. 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   Electronically Signed   By: Garald Balding M.D.   On: 01/30/2015 18:03   Dg Chest Port 1 View  01/30/2015   CLINICAL DATA:  Altered mental status. Cough, congestion for 5 days.  EXAM: PORTABLE CHEST - 1 VIEW  COMPARISON:  01/19/2015  FINDINGS: Low lung volumes. Right basilar atelectasis is similar to prior study. Minimal left base atelectasis. Mild cardiomegaly. No visible effusions or acute bony abnormality.  IMPRESSION: Low lung volumes, bibasilar atelectasis.   Electronically Signed   By: Rolm Baptise M.D.   On: 01/30/2015 15:59    EKG:   Orders placed or performed during the hospital encounter of 01/19/15  . ED EKG  . ED EKG  . EKG    ASSESSMENT AND PLAN:   Principal Problem:   Sepsis Active Problems:   Neurogenic bladder   Chronic suprapubic catheter   Neurodegenerative disorder   GERD (gastroesophageal reflux disease)   UTI (lower urinary tract infection)  #1 sepsis: Met criteria initially with hypothermia, hypotension and urinary tract infection. - improving, temp increasing, BP increasing  No end organ damage. Developing leukocytosis. He has received a 1 L bolus in the emergency room with good improvement in blood pressure. UA positive for infection, culture results pending. Chest x-ray negative. 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  - Urine and blood cultures are pending.  - He has a chronic suprapubic catheter and has been on suppressive nitrofurantoin for 1 week prior to admission. He has had a recent catheter exchange and cystoscopy by his urologist. Will attempt to contact Dr. Jacqlyn Larsen his urologist.  Continue oxybutynin for neurogenic bladder - Has had rash over bilateral forearms, I do not think this is likely a drug reaction, continue Zosyn  #3 altered mental status: - improved from yesterday - Patient family reports decreased mentation and alertness over past weeks.  - Appreciate palliative care consultation - Appreciate speech therapy consultation  #4 GERD: Continue PPI  #5 history of pulmonary embolism and DVT: Continue with warfarin per pharmacy  #6 atrial fibrillation: Continue metoprolol and flecainide. Monitor on telemetry. He did some fluctuation in his heart rate with some bradycardia during the admission process, seems improved now. If this continues would hold these rate control medications.  All the records are reviewed and case discussed with Care Management/Social Workerr. Management plans discussed with the patient, family and they are in agreement.  CODE STATUS: DNR  TOTAL TIME TAKING CARE OF THIS PATIENT: 35 minutes.   POSSIBLE D/C IN 2 DAYS, DEPENDING ON CLINICAL CONDITION.   Myrtis Ser M.D on 02/01/2015 at 2:01 PM  Between 7am to 6pm - Pager - [phone]  After 6pm go to  [email or web address] - password [email or web address] [facility]  [facility]  Office  [phone]  CC: Primary care physician; Ashok Norris, MD

## 2015-02-01 NOTE — Evaluation (Signed)
Clinical/Bedside Swallow Evaluation Patient Details  Name: Kent [name] MRN: [id number] Date of Birth: May 24, 1966  Today's Date: 02/01/2015 Time: SLP Start Time (ACUTE ONLY): 0830 SLP Stop Time (ACUTE ONLY): 0930 SLP Time Calculation (min) (ACUTE ONLY): 60 min  Past Medical History:  Past Medical History  Diagnosis Date  . Frontotemporal dementia   . Sleep apnea   . Hypertension   . Depression   . Neurogenic bladder   . Myelopathy   . Obesity   . Degeneration, intervertebral disc, cervical   . Elevated liver function tests   . Pulmonary embolus     on chronic anticoagulation  . Chronic UTI (urinary tract infection)   . Pernicious anemia   . Rosacea   . BPH (benign prostatic hypertrophy)   . History of DVT (deep vein thrombosis)   . History of pulmonary embolism     currently on coumadin  . Anxiety   . New onset a-fib 2013   Past Surgical History:  Past Surgical History  Procedure Laterality Date  . Suprapubic catheter insertion    . Kidney stone surgery     HPI:  Father reported they feed the pt liquids by TSP "initially" during meals at home "then he starts drinking by cup". Father indicated pt does have episodes of coughing/choking during oral intake at home. Pt eats soft foods the parents have cooked and "cut up well". He acknoledged some puree foods in the diet as well. Pt appears well-nourished. Toward end of eval, Father indicated they give pills in "thickened liquids" at home - "We keep a can of thickener".     Assessment / Plan / Recommendation Clinical Impression  Pt presents w/ moderate+ oral phase dysphagia c/b decreased bolus awareness, disorganized/delayed bolus manipulation and cohesion for A-P transfer, oral residue post swallowing, and decreased labial seal around the utensil when taking the bolus. Pt required verbal/tactile cues during the feeding when presenting the boluses for eating. During the pharyngeal phase, suspect delayed pharyngeal  swallowing, however, no overt s/s of aspiration occured during the bolus trials of thin liquids via TSP and Purees. No trials of increased solids were assessed sec. to presentation and increased risk for aspiration/choking w/ poorly masticated solids. Rec. a Dys. 1 diet w/ thin liquids by TSP (as is his baseline per Father) w/ consideration of use of a Dysphagia Drink Cup to monitor bolus amount/size of thin liquids to lessen risk for aspiration. Rec. strict aspiration precautions during feeding; Meds in Puree - Crushed as able. Rec. Reflux precautions (belching noted intermittently during po trials). Rec. a Dietician consult for appropriate nutrition/hydration needs.     Aspiration Risk   (Mild-moderate sec. to his oropharyngeal phase dysphagia as w)    Diet Recommendation Dysphagia 1 (Puree);Thin (via TSP or Dysphagia Drink Cup)   Medication Administration: Crushed with puree Compensations: Slow rate;Small sips/bites (check for oral clearing)    Other  Recommendations Recommended Consults:  (Dietician consult) Oral Care Recommendations: Oral care BID;Oral care before and after PO;Staff/trained caregiver to provide oral care   Follow Up Recommendations       Frequency and Duration min 3x week  1 week   Pertinent Vitals/Pain Did not seem to indicate; Father did not indicate    SLP Swallow Goals     Swallow Study Prior Functional Status       General Date of Onset: 01/30/15 (Father reported baseline oropharyngeal phase dysphagia) Other Pertinent Information: Father reported they feed the pt liquids by TSP "initially" during meals  at home "then he starts drinking by cup". Father indicated pt does have episodes of coughing/choking during oral intake at home. Pt eats soft foods the parents have cooked and "cut up well". He acknoledged some puree foods in the diet as well. Pt appears well-nourished. Toward end of eval, Father indicated they give pills in "thickened liquids" at home - "We  keep a can of thickener".   Type of Study: Bedside swallow evaluation Previous Swallow Assessment: none reported by Father Diet Prior to this Study: Regular;Thin liquids Temperature Spikes Noted: No Respiratory Status: Room air History of Recent Intubation: No Behavior/Cognition: Alert;Cooperative;Requires cueing;Distractible Oral Cavity - Dentition: Adequate natural dentition/normal for age Self-Feeding Abilities: Total assist Patient Positioning: Upright in bed Baseline Vocal Quality: Normal (phonations) Volitional Cough: Cognitively unable to elicit Volitional Swallow: Unable to elicit    Oral/Motor/Sensory Function Overall Oral Motor/Sensory Function:  (could not assess/perform d/t Cognitive limitations)   Ice Chips Ice chips: Impaired Presentation: Spoon Oral Phase Impairments: Reduced labial seal;Reduced lingual movement/coordination Oral Phase Functional Implications: Prolonged oral transit Other Comments:  (no coughing/choking noted)   Thin Liquid Thin Liquid: Impaired Presentation: Spoon (fed by SLP) Oral Phase Impairments: Reduced labial seal;Impaired anterior to posterior transit;Reduced lingual movement/coordination Pharyngeal  Phase Impairments: Suspected delayed Swallow Other Comments:  (no coughing noted; no change in phonation quality)    Nectar Thick Nectar Thick Liquid: Not tested   Honey Thick Honey Thick Liquid: Not tested   Puree Puree: Impaired Presentation: Spoon (fed by SLP) Oral Phase Impairments: Reduced labial seal;Impaired anterior to posterior transit;Reduced lingual movement/coordination;Poor awareness of bolus Oral Phase Functional Implications: Oral residue;Prolonged oral transit Pharyngeal Phase Impairments: Suspected delayed Swallow Other Comments:  (no coughing noted; no change in phonation quality)   Solid   GO    Solid: Not tested Other Comments:  (not assessed d/t the presentation quality w/ puree trials)        [name],[name] 02/01/2015,12:59 PM

## 2015-02-01 NOTE — Consult Note (Signed)
I met with pt's mother. She says that pt has been declining over the past several months which is confirmed by his 3 hospitalizations and 3 ER visits over the past 5 months. Mother also says that she still has CAPS worker in the home but hours have been increased to 45 hrs/week (every day for 8-10 hrs each day). Mother works outside the home so the majority of care falls on pt's father who is not in good health. I once again discussed the involvement of home hospice but mother is not sure she wants this and she is sure that pt's father would not want this. I informed mother that, if family decide they would like hospice, they can contact pt's PCP.  Mother expressed appreciation for meeting. All questions answered.

## 2015-02-01 NOTE — Progress Notes (Signed)
Resting well past bath this shift. c-pap in use at night meds given with applesauce. Iv fluids infusing. With abx. Dad at bedside.suprapubic cath inplace and draining . Turned and repositioned as needed.

## 2015-02-01 NOTE — Plan of Care (Signed)
Problem: SLP Dysphagia Goals Goal: Misc Dysphagia Goal Pt will safely tolerate po diet of least restrictive consistency w/ no overt s/s of aspiration noted by Staff/pt/family x3 sessions.    

## 2015-02-01 NOTE — Progress Notes (Signed)
ANTICOAGULATION CONSULT NOTE - Initial Consult  Pharmacy Consult for Coumadin Indication: history of PE and DVT  Allergies  Allergen Reactions  . Codeine Nausea Only  . Vancomycin Other (See Comments)    Red mans syndrome     Patient Measurements: Height: 5\' 11"  (180.3 cm) Weight: 287 lb (130.182 kg) IBW/kg (Calculated) : 75.3  Vital Signs: Temp: 98.9 F (37.2 C) (05/23 0806) Temp Source: Oral (05/23 0806) BP: 109/69 mmHg (05/23 0804) Pulse Rate: 86 (05/23 0804)  Labs:  Recent Labs  01/30/15 1510 01/31/15 0505 02/01/15 0426 02/01/15 1001  HGB 12.3* 11.7*  --  11.7*  HCT 38.7* 36.3*  --  36.5*  PLT 229 223  --  267  LABPROT 28.9*  --  30.0*  --   INR 2.72  --  2.85  --   CREATININE 0.46* 0.43*  --   --     Estimated Creatinine Clearance: 153.7 mL/min (by C-G formula based on Cr of 0.43).   Medical History: Past Medical History  Diagnosis Date  . Frontotemporal dementia   . Sleep apnea   . Hypertension   . Depression   . Neurogenic bladder   . Myelopathy   . Obesity   . Degeneration, intervertebral disc, cervical   . Elevated liver function tests   . Pulmonary embolus     on chronic anticoagulation  . Chronic UTI (urinary tract infection)   . Pernicious anemia   . Rosacea   . BPH (benign prostatic hypertrophy)   . History of DVT (deep vein thrombosis)   . History of pulmonary embolism     currently on coumadin  . Anxiety   . New onset a-fib 2013    Medications:  Scheduled:  . benzonatate  100 mg Oral QHS  . citalopram  20 mg Oral BID  . clindamycin  1 application Topical BID  . dextromethorphan  15 mg Oral BID  . flecainide  50 mg Oral Q12H  . fluticasone  1 spray Each Nare BID  . guaiFENesin  600 mg Oral BID  . loratadine  10 mg Oral Daily  . LORazepam  0.5 mg Oral BID  . metoprolol tartrate  12.5 mg Oral BID  . oxybutynin  5 mg Oral TID  . pantoprazole  40 mg Oral Daily  . piperacillin-tazobactam (ZOSYN)  IV  4.5 g Intravenous 3 times  per day  . [START ON 02/02/2015] polyethylene glycol  17 g Oral Daily  . pravastatin  40 mg Oral q1800  . QUEtiapine  50 mg Oral QHS  . warfarin  2.5 mg Oral Q48H  . warfarin  5 mg Oral Q48H    Assessment: Patient with history of PE and DVT admitted for urosepsis being treated with zosyn. Continued on home regimen of coumadin 5mg  every other day, 2.5mg  on alternate days.  Goal of Therapy:  INR 2-3   Plan:  Currently therapeutic, will continue home regimen and follow INR closely as zosyn may enhance the effect of warfarin.  Pharmacy to follow per consult  Rexene Edison, PharmD Clinical Pharmacist 02/01/2015,2:47 PM

## 2015-02-01 NOTE — Progress Notes (Signed)
Patient is nonverbal, does not appear to be in distress or pain.  Able to swallow pills whole or crushed with applesauce and encouragement.  Family at bedside.  Palliative care consult initiated.

## 2015-02-01 NOTE — Care Management Note (Signed)
Case Management Note  Patient Details  Name: Kent [name] MRN: [id number] Date of Birth: Jan 10, 1966  Subjective/Objective:   Presents from home with altered mental status and sepsis. PMH: chronic neurogenic condition of unk type.  Lives at home with parents who are his primary care givers. At baseline pt is bedbound. [facility]. Pt also has PCS services, 58 hours per week. No discharge needs identified at this time. Will follow.               Action/Plan:    Expected Discharge Date:   2 days               Expected Discharge Plan:  [place]  In-House Referral:     Discharge planning Services  CM Consult  Post Acute Care Choice:    Choice offered to:     DME Arranged:    DME Agency:     HH Arranged:  RN [place] Agency:  [facility]  Status of Service:  In process, will continue to follow  Medicare Important Message Given:  Yes Date Medicare IM Given:  02/01/15 Medicare IM give by:  Orvan July Date Additional Medicare IM Given:    Additional Medicare Important Message give by:     If discussed at [place] of Stay Meetings, dates discussed:    Additional Comments:  Jolly Mango, RN 02/01/2015, 2:29 PM

## 2015-02-01 NOTE — Consult Note (Signed)
Palliative Medicine Inpatient Consult Note   Name: Kent [name] Date: 02/01/2015 MRN: [id number]  DOB: July 23, 1966  Referring Physician: Aldean Jewett, MD  Palliative Care consult requested for this 50 y.o. male for goals of medical therapy in patient with progressive neurological disease admitted with altered mental status, decreased po, sepsis/UTI  Kent [name] is a 49 yo man with PMH of a progressive neurological disease, onset at age 64, unknown dx. He also has a h/o DVT/PE, a.fib, B-12 deficiency, neurogenic bladder s/p suprapubic cath (05/2014). He was admitted 01/30/15 with altered mental status, decreased po. Work-up c/w sepsis likely from UTI. Urine cx pending. At present, pt is lying in bed, awake, seems to interact but nonverbal. Father at bedside. Marland Kitchen    REVIEW OF SYSTEMS:  Patient is not able to provide ROS  SOCIAL HISTORY: Pt lives at home with his parents as caregivers. They have assistance from [place] worker.  reports that he has never smoked. He does not have any smokeless tobacco history on file. He reports that he does not drink alcohol or use illicit drugs.  CODE STATUS: DNR  PAST MEDICAL HISTORY: Past Medical History  Diagnosis Date  . Frontotemporal dementia   . Sleep apnea   . Hypertension   . Depression   . Neurogenic bladder   . Myelopathy   . Obesity   . Degeneration, intervertebral disc, cervical   . Elevated liver function tests   . Pulmonary embolus     on chronic anticoagulation  . Chronic UTI (urinary tract infection)   . Pernicious anemia   . Rosacea   . BPH (benign prostatic hypertrophy)   . History of DVT (deep vein thrombosis)   . History of pulmonary embolism     currently on coumadin  . Anxiety   . New onset a-fib 2013    PAST SURGICAL HISTORY:  Past Surgical History  Procedure Laterality Date  . Suprapubic catheter insertion    . Kidney stone surgery      ALLERGIES:  is allergic to codeine and vancomycin.  MEDICATIONS:   Current Facility-Administered Medications  Medication Dose Route Frequency Provider Last Rate Last Dose  . 0.9 %  sodium chloride infusion   Intravenous Continuous Aldean Jewett, MD 75 mL/hr at 01/31/15 1338    . acetaminophen (TYLENOL) tablet 650 mg  650 mg Oral Q6H PRN Aldean Jewett, MD       Or  . acetaminophen (TYLENOL) suppository 650 mg  650 mg Rectal Q6H PRN Aldean Jewett, MD      . benzonatate (TESSALON) capsule 100 mg  100 mg Oral QHS Aldean Jewett, MD   100 mg at 01/31/15 2250  . citalopram (CELEXA) tablet 20 mg  20 mg Oral BID Aldean Jewett, MD   20 mg at 02/01/15 9833  . clindamycin (CLEOCIN T) 1 % external solution 1 application  1 application Topical BID Aldean Jewett, MD   1 application at 82/50/53 0135  . dextromethorphan (DELSYM) 30 MG/5ML liquid 15 mg  15 mg Oral BID Aldean Jewett, MD   15 mg at 02/01/15 1000  . flecainide (TAMBOCOR) tablet 50 mg  50 mg Oral Q12H Aldean Jewett, MD   50 mg at 02/01/15 0936  . fluticasone (FLONASE) 50 MCG/ACT nasal spray 1 spray  1 spray Each Nare BID Aldean Jewett, MD   1 spray at 02/01/15 [phone]  . guaiFENesin (MUCINEX) 12 hr tablet 600 mg  600 mg Oral  BID Aldean Jewett, MD   600 mg at 02/01/15 7322  . ipratropium-albuterol (DUONEB) 0.5-2.5 (3) MG/3ML nebulizer solution 3 mL  3 mL Inhalation Q4H PRN Aldean Jewett, MD      . loratadine (CLARITIN) tablet 10 mg  10 mg Oral Daily Aldean Jewett, MD   10 mg at 02/01/15 0254  . LORazepam (ATIVAN) tablet 0.5 mg  0.5 mg Oral BID Aldean Jewett, MD   0.5 mg at 02/01/15 2706  . metoprolol tartrate (LOPRESSOR) tablet 12.5 mg  12.5 mg Oral BID Aldean Jewett, MD   12.5 mg at 02/01/15 2376  . ondansetron (ZOFRAN) tablet 4 mg  4 mg Oral Q6H PRN Aldean Jewett, MD       Or  . ondansetron [facility]) injection 4 mg  4 mg Intravenous Q6H PRN Aldean Jewett, MD      . oxybutynin (DITROPAN) tablet 5 mg  5 mg Oral TID Aldean Jewett, MD   5 mg at 02/01/15  2831  . pantoprazole (PROTONIX) EC tablet 40 mg  40 mg Oral Daily Aldean Jewett, MD   40 mg at 02/01/15 5176  . piperacillin-tazobactam (ZOSYN) IVPB 4.5 g  4.5 g Intravenous 3 times per day Aldean Jewett, MD   Stopped at 02/01/15 0600  . polyethylene glycol (MIRALAX / GLYCOLAX) packet 17 g  17 g Oral Daily PRN Aldean Jewett, MD   17 g at 02/01/15 0935  . polyethylene glycol powder (GLYCOLAX/MIRALAX) container 255 g  1 Container Oral BH-q7a Aldean Jewett, MD   255 g at 01/31/15 0700  . pravastatin (PRAVACHOL) tablet 40 mg  40 mg Oral q1800 Aldean Jewett, MD   40 mg at 01/31/15 1800  . QUEtiapine (SEROQUEL) tablet 50 mg  50 mg Oral QHS Aldean Jewett, MD   50 mg at 01/31/15 2251  . warfarin (COUMADIN) tablet 2.5 mg  2.5 mg Oral Q48H Lytle Butte, MD   2.5 mg at 02/01/15 1607  . warfarin (COUMADIN) tablet 5 mg  5 mg Oral Q48H Lytle Butte, MD   5 mg at 01/31/15 0836    Vital Signs: BP 109/69 mmHg  Pulse 86  Temp(Src) 98.9 F (37.2 C) (Oral)  Resp 18  Ht 5\' 11"  (1.803 m)  Wt 130.182 kg (287 lb)  BMI 40.05 kg/m2  SpO2 94% Filed Weights   01/30/15 1619 01/31/15 1000  Weight: 130.6 kg (287 lb 14.7 oz) 130.182 kg (287 lb)    Estimated body mass index is 40.05 kg/(m^2) as calculated from the following:   Height as of this encounter: 5\' 11"  (1.803 m).   Weight as of this encounter: 130.182 kg (287 lb).  PERFORMANCE STATUS (ECOG) : 4 - Bedbound  PHYSICAL EXAM:  Generall: ill-appearing HEENT: OP clear, moist oral mucosa Neck: Trachea midline  Cardiovascular: regular rate and rhythm Pulmonary/Chest: coarse BS's ant [place] Abdominal: Soft, NTTP, + bowel sounds GU: No SP tenderness Extremities: 1+ edema  Neurological: grips with R.hand, attempts to stick out tongue on command Skin: no rashes Psychiatric: awake, unable to assess further   LABS: CBC:  Recent Labs Lab 01/30/15 1510 01/31/15 0505  WBC 8.3 11.0*  HGB 12.3* 11.7*  HCT 38.7* 36.3*  PLT 229  223   Comprehensive Metabolic Panel:  Recent Labs Lab 01/30/15 1510 01/31/15 0505  NA 140 140  K 4.0 3.9  CL 104 106  CO2 30 27  GLUCOSE 86 76  BUN 11 10  CREATININE 0.46* 0.43*  CALCIUM 9.3 8.9  AST 38  --   ALT 44  --   ALKPHOS 81  --   BILITOT 0.6  --     IMPRESSION:  Kent [name] is a 49 yo man with PMH of a progressive neurological disease, onset at age 46, unknown dx. He also has a h/o DVT/PE, a.fib, B-12 deficiency, neurogenic bladder s/p suprapubic cath (05/2014). He was admitted 01/30/15 with altered mental status, decreased po. Work-up c/w sepsis likely from UTI. Urine cx pending.   Pt is known to me from previous hospitalization 09/2014. He lives at home with his parents as caregivers. They have assistance from [place] workers 6hrs/day, 6 days/wk. They have a lift and a whirlpool tub. I talked with them about the involvement of hospice at that time and they were not interested.   Pt's father is here now but mother will be at hospital later this AM. Will return so that I can speak with both parents.   PLAN: Family meeting when parents available   More than 50% of the visit was spent in counseling/coordination of care: YES  Time spent: 70 minutes

## 2015-02-01 NOTE — Progress Notes (Signed)
[place] for Zosyn dosing Indication: UTI/sepsis  Allergies  Allergen Reactions  . Codeine Nausea Only  . Vancomycin Other (See Comments)    Red mans syndrome     Patient Measurements: Height: 5\' 11"  (180.3 cm) Weight: 287 lb (130.182 kg) IBW/kg (Calculated) : 75.3   Vital Signs: Temp: 98.9 F (37.2 C) (05/23 0806) Temp Source: Oral (05/23 0806) BP: 109/69 mmHg (05/23 0804) Pulse Rate: 86 (05/23 0804) Intake/Output from previous day: 05/22 0701 - 05/23 0700 In: 573.8 [P.O.:360; I.V.:213.8] Out: 1000 [Urine:1000]   Labs:  Recent Labs  01/30/15 1510 01/31/15 0505 02/01/15 1001  WBC 8.3 11.0* 16.1*  HGB 12.3* 11.7* 11.7*  PLT 229 223 267  CREATININE 0.46* 0.43*  --    Estimated Creatinine Clearance: 153.7 mL/min (by C-G formula based on Cr of 0.43).  Microbiology: Recent Results (from the past 720 hour(s))  Urine culture     Status: None   Collection Time: 01/19/15  7:01 PM  Result Value Ref Range Status   Specimen Description URINE, CLEAN CATCH  Final   Special Requests URINE, CLEAN CATCH  Final   Culture   Final    URINE CULTURE WAS NEVER SET UP URINE HAS SINCE BEEN DISCARDED    Report Status 01/23/2015 FINAL  Final  Blood Culture (routine x 2)     Status: None (Preliminary result)   Collection Time: 01/30/15  4:01 PM  Result Value Ref Range Status   Specimen Description BLOOD  Final   Special Requests NONE  Final   Culture NO GROWTH < 24 HOURS  Final   Report Status PENDING  Incomplete  Blood Culture (routine x 2)     Status: None (Preliminary result)   Collection Time: 01/30/15  4:03 PM  Result Value Ref Range Status   Specimen Description BLOOD  Final   Special Requests NONE  Final   Culture NO GROWTH < 24 HOURS  Final   Report Status PENDING  Incomplete  Urine culture     Status: None (Preliminary result)   Collection Time: 01/30/15  4:31 PM  Result Value Ref Range Status   Specimen Description URINE, RANDOM   Final   Special Requests NONE  Final   Culture TOO YOUNG TO READ  Final   Report Status PENDING  Incomplete    Medical History: Past Medical History  Diagnosis Date  . Frontotemporal dementia   . Sleep apnea   . Hypertension   . Depression   . Neurogenic bladder   . Myelopathy   . Obesity   . Degeneration, intervertebral disc, cervical   . Elevated liver function tests   . Pulmonary embolus     on chronic anticoagulation  . Chronic UTI (urinary tract infection)   . Pernicious anemia   . Rosacea   . BPH (benign prostatic hypertrophy)   . History of DVT (deep vein thrombosis)   . History of pulmonary embolism     currently on coumadin  . Anxiety   . New onset a-fib 2013    Assessment: Patient with urosepsis and history of MDR pseudomonas UTI  Goal of Therapy:  Resolution of infection  Plan:  Will continue zosyn 4.5gm IV Q8H EI  Pharmacy to follow per consult  Rexene Edison, PharmD Clinical Pharmacist 02/01/2015 10:19 AM

## 2015-02-02 LAB — PROTIME-INR
INR: 2.25
Prothrombin Time: 25 seconds — ABNORMAL HIGH (ref 11.4–15.0)

## 2015-02-02 LAB — CBC
HEMATOCRIT: 33.4 % — AB (ref 40.0–52.0)
Hemoglobin: 11 g/dL — ABNORMAL LOW (ref 13.0–18.0)
MCH: 28.7 pg (ref 26.0–34.0)
MCHC: 32.8 g/dL (ref 32.0–36.0)
MCV: 87.5 fL (ref 80.0–100.0)
PLATELETS: 234 10*3/uL (ref 150–440)
RBC: 3.82 MIL/uL — AB (ref 4.40–5.90)
RDW: 16.7 % — ABNORMAL HIGH (ref 11.5–14.5)
WBC: 9.2 10*3/uL (ref 3.8–10.6)

## 2015-02-02 MED ORDER — ERYTHROMYCIN 5 MG/GM OP OINT
TOPICAL_OINTMENT | Freq: Three times a day (TID) | OPHTHALMIC | Status: DC
  Administered 2015-02-02 – 2015-02-03 (×3): 1 via OPHTHALMIC
  Filled 2015-02-02: qty 3.5

## 2015-02-02 MED ORDER — ZINC OXIDE 20 % EX OINT
TOPICAL_OINTMENT | CUTANEOUS | Status: DC | PRN
  Administered 2015-02-02 (×2): via TOPICAL
  Filled 2015-02-02: qty 28.35

## 2015-02-02 MED ORDER — CLINDAMYCIN PHOSPHATE 1 % EX SOLN
Freq: Two times a day (BID) | CUTANEOUS | Status: DC
  Administered 2015-02-02 – 2015-02-03 (×2): via TOPICAL
  Filled 2015-02-02: qty 30

## 2015-02-02 MED ORDER — DEXTROSE 5 % IV SOLN
1.0000 g | Freq: Three times a day (TID) | INTRAVENOUS | Status: DC
  Administered 2015-02-02 – 2015-02-03 (×3): 1 g via INTRAVENOUS
  Filled 2015-02-02 (×8): qty 1

## 2015-02-02 NOTE — Clinical Documentation Improvement (Signed)
Risk Factors: Patient with a history of neurogenic bladder and chronic indwelling foley catheter, now with a UTI per 5/21 progress notes. If there is a relationship between the chronic indwelling foley catheter and the UTI, please document in the progress notes and the discharge summary.   Possible Conditions: >  UTI due to indwelling foley cath >  Other >  Not able to determine     Thank you,  Jeannetta Ellis ,RN Clinical Documentation Specialist:  [place]

## 2015-02-02 NOTE — Progress Notes (Signed)
[place] at [place] NAME: Kent [name]    MR#:  [id number]  DATE OF BIRTH:  08-28-1966  SUBJECTIVE:   Doing well, alert, facial expressions, following commands  REVIEW OF SYSTEMS:   ROS unable to perform due to non verbal patient  DRUG ALLERGIES:   Allergies  Allergen Reactions  . Codeine Nausea Only  . Vancomycin Other (See Comments)    Red mans syndrome     VITALS:  Blood pressure 106/62, pulse 80, temperature 99.3 F (37.4 C), temperature source Oral, resp. rate 18, height $RemoveBe'5\' 11"'kfxwGgmwd$  (1.803 m), weight 130.182 kg (287 lb), SpO2 93 %.  PHYSICAL EXAMINATION:  GENERAL: 49 y.o.-year-old patient lying in the bed with no acute distress. Obese. EYES: Pupils equal, round, reactive to light and accommodation. No scleral icterus. Extraocular muscles intact.  HEENT: Head atraumatic, normocephalic. Oropharynx and nasopharynx clear. Fair dentition, mucous membranes dry NECK: Supple, no jugular venous distention. No thyroid enlargement, no tenderness.  LUNGS: Normal breath sounds bilaterally, no wheezing, rales, rhonchi or crepitation. No use of accessory muscles of respiration. CARDIOVASCULAR: S1, S2 normal. No murmurs, rubs, or gallops. Irregular ABDOMEN: Soft, nontender, distended and tympanic. Bowel sounds present. No organomegaly or mass.  EXTREMITIES: +1 edema, cyanosis, or clubbing.  NEUROLOGIC: Not participate in neurologic examination, good eye contact today, some positive facial expressions, more movement, intentional movement of right hand PSYCHIATRIC: Unable to assess SKIN: Mild lacy erythema over the forearms   LABORATORY PANEL:   CBC  Recent Labs Lab 02/02/15 0601  WBC 9.2  HGB 11.0*  HCT 33.4*  PLT 234   ------------------------------------------------------------------------------------------------------------------  Chemistries   Recent Labs Lab 01/30/15 1510 01/31/15 0505  NA 140 140  K 4.0 3.9   CL 104 106  CO2 30 27  GLUCOSE 86 76  BUN 11 10  CREATININE 0.46* 0.43*  CALCIUM 9.3 8.9  AST 38  --   ALT 44  --   ALKPHOS 81  --   BILITOT 0.6  --    ------------------------------------------------------------------------------------------------------------------  Cardiac Enzymes No results for input(s): TROPONINI in the last 168 hours. ------------------------------------------------------------------------------------------------------------------  RADIOLOGY:  No results found.  EKG:   Orders placed or performed during the hospital encounter of 01/19/15  . ED EKG  . ED EKG  . EKG    ASSESSMENT AND PLAN:   Principal Problem:   Sepsis Active Problems:   Neurogenic bladder   Chronic suprapubic catheter   Neurodegenerative disorder   GERD (gastroesophageal reflux disease)   UTI (lower urinary tract infection)  #1 sepsis: Met criteria initially with hypothermia, hypotension and urinary tract infection. - improving, temp increasing, BP increasing  No end organ damage. Leukocytosis improving. He received a 1 L bolus in the emergency room with good improvement in blood pressure. UA positive for infection, culture results pending likely pseudomonas. Chest x-ray negative. Blood cultures NTD - Today note that his temperatures increasing to 99 and he does have a leukocytosis is improving  #2 urinary tract infection:  - Urine and blood cultures are pending. Discussed results with lab this morning, urine culture likely pseudomonas, sensitivities pending or tomorrow - change to [place] based on prior pseudomonal cultures and due to the rash has developed over his forearms which may be related to Zosyn - Discussed frequent recurrent urinary tract infections with his urologist Dr. Jacqlyn Larsen today. After discharge she will continue with his prior regimen of oxybutynin and bladder washings. He will need to complete his course of Tressie Ellis,  or appropriate and biotics based on culture  results. He will follow-up with Dr. Jacqlyn Larsen.  #3 altered mental status: - improved from yesterday - Appreciate palliative care consultation - Appreciate speech therapy consultation and dietary recommendations  #4 GERD: Continue PPI  #5 history of pulmonary embolism and DVT: Continue with warfarin per pharmacy, SCDs  #6 atrial fibrillation: Continue metoprolol and flecainide. Monitor on telemetry. He did have some fluctuation in his heart rate with some bradycardia during the admission process, seems improved now. If this continues would hold these rate control medications.  All the records are reviewed and case discussed with Care Management/Social Workerr. Management plans discussed with the patient, family and they are in agreement.  CODE STATUS: DNR  TOTAL TIME TAKING CARE OF THIS PATIENT: 35 minutes.   POSSIBLE D/C IN 2 DAYS, DEPENDING ON CLINICAL CONDITION.   Myrtis Ser M.D on 02/02/2015 at 1:14 PM  Between 7am to 6pm - Pager - [phone]  After 6pm go to [email or web address] - password [email or web address] [facility]  [facility] [place] [facility]  Office  [phone]  CC: Primary care physician; Ashok Norris, MD

## 2015-02-02 NOTE — Progress Notes (Signed)
Pts VSS. Alert but nonverbal. Scheduled IV antibiotics given. Suprapubic cath in place. Pt turned Q2 with pillow support. Will continue to monitor.

## 2015-02-02 NOTE — Outcomes Assessment (Signed)
VSS, patient is alert and nonverbal but appears to understand.  Pain is relieved with PRN tylenol.  cpap at night. Patient turned q2h.  2 pressure sores noted to buttocks and sacral dressing applied.  IV antibiotics infusing.  Suprapubic catheter is patent. No signs of distress.

## 2015-02-02 NOTE — Progress Notes (Signed)
ANTIBIOTIC CONSULT NOTE - FOLLOW UP  Pharmacy Consult for Ceftazidime Indication: UTI/Sepsis  Allergies  Allergen Reactions  . Codeine Nausea Only  . Vancomycin Other (See Comments)    Red mans syndrome     Patient Measurements: Height: 5\' 11"  (180.3 cm) Weight: 287 lb (130.182 kg) IBW/kg (Calculated) : 75.3  Vital Signs: Temp: 99.3 F (37.4 C) (05/24 0754) Temp Source: Oral (05/24 0754) BP: 106/62 mmHg (05/24 1020) Pulse Rate: 80 (05/24 1020) Intake/Output from previous day: 05/23 0701 - 05/24 0700 In: 700 [I.V.:600; IV Piggyback:100] Out: 551 [Urine:550; Stool:1] Intake/Output from this shift: Total I/O In: 150 [P.O.:150] Out: 200 [Urine:200]  Labs:  Recent Labs  01/30/15 1510 01/31/15 0505 02/01/15 1001 02/02/15 0601  WBC 8.3 11.0* 16.1* 9.2  HGB 12.3* 11.7* 11.7* 11.0*  PLT 229 223 267 234  CREATININE 0.46* 0.43*  --   --    Estimated Creatinine Clearance: 153.7 mL/min (by C-G formula based on Cr of 0.43).  Microbiology: Recent Results (from the past 720 hour(s))  Urine culture     Status: None   Collection Time: 01/19/15  7:01 PM  Result Value Ref Range Status   Specimen Description URINE, CLEAN CATCH  Final   Special Requests URINE, CLEAN CATCH  Final   Culture   Final    URINE CULTURE WAS NEVER SET UP URINE HAS SINCE BEEN DISCARDED    Report Status 01/23/2015 FINAL  Final  Blood Culture (routine x 2)     Status: None (Preliminary result)   Collection Time: 01/30/15  4:01 PM  Result Value Ref Range Status   Specimen Description BLOOD  Final   Special Requests NONE  Final   Culture NO GROWTH 3 DAYS  Final   Report Status PENDING  Incomplete  Blood Culture (routine x 2)     Status: None (Preliminary result)   Collection Time: 01/30/15  4:03 PM  Result Value Ref Range Status   Specimen Description BLOOD  Final   Special Requests NONE  Final   Culture NO GROWTH 3 DAYS  Final   Report Status PENDING  Incomplete  Urine culture     Status: None  (Preliminary result)   Collection Time: 01/30/15  4:31 PM  Result Value Ref Range Status   Specimen Description URINE, RANDOM  Final   Special Requests NONE  Final   Culture   Final    >=100,000 COLONIES/mL PSEUDOMONAS AERUGINOSA SUSCEPTIBILITIES TO FOLLOW    Report Status PENDING  Incomplete    Anti-infectives    Start     Dose/Rate Route Frequency Ordered Stop   02/02/15 1030  cefTAZidime (FORTAZ) 1 g in dextrose 5 % 50 mL IVPB     1 g 100 mL/hr over 30 Minutes Intravenous 3 times per day 02/02/15 0938     01/30/15 2230  piperacillin-tazobactam (ZOSYN) IVPB 4.5 g  Status:  Discontinued     4.5 g 25 mL/hr over 240 Minutes Intravenous 3 times per day 01/30/15 2228 02/02/15 0825      Assessment: Patient with neurogenic bladder and history of MDR pseudomonas UTIs Previously on zosyn x 4 days, developed rash that continued to worsen. Changing to ceftazidime.  Urine culture with > 100k pseudomonas, sensitivities pending  Plan:  Ordered ceftazidime 1gm IV Q8H.  Pharmacy to follow per consult  Rexene Edison, PharmD Clinical Pharmacist 02/02/2015,2:24 PM

## 2015-02-02 NOTE — Progress Notes (Signed)
[facility] for Coumadin Indication: history of PE and DVT, afib  Allergies  Allergen Reactions  . Codeine Nausea Only  . Vancomycin Other (See Comments)    Red mans syndrome     Patient Measurements: Height: 5\' 11"  (180.3 cm) Weight: 287 lb (130.182 kg) IBW/kg (Calculated) : 75.3  Vital Signs: Temp: 99.3 F (37.4 C) (05/24 0754) Temp Source: Oral (05/24 0754) BP: 107/68 mmHg (05/24 0754) Pulse Rate: 76 (05/24 0754)  Labs:  Recent Labs  01/30/15 1510 01/31/15 0505 02/01/15 0426 02/01/15 1001 02/02/15 0601  HGB 12.3* 11.7*  --  11.7* 11.0*  HCT 38.7* 36.3*  --  36.5* 33.4*  PLT 229 223  --  267 234  LABPROT 28.9*  --  30.0*  --  25.0*  INR 2.72  --  2.85  --  2.25  CREATININE 0.46* 0.43*  --   --   --     Estimated Creatinine Clearance: 153.7 mL/min (by C-G formula based on Cr of 0.43).   Medical History: Past Medical History  Diagnosis Date  . Frontotemporal dementia   . Sleep apnea   . Hypertension   . Depression   . Neurogenic bladder   . Myelopathy   . Obesity   . Degeneration, intervertebral disc, cervical   . Elevated liver function tests   . Pulmonary embolus     on chronic anticoagulation  . Chronic UTI (urinary tract infection)   . Pernicious anemia   . Rosacea   . BPH (benign prostatic hypertrophy)   . History of DVT (deep vein thrombosis)   . History of pulmonary embolism     currently on coumadin  . Anxiety   . New onset a-fib 2013    Medications:  Scheduled:  . benzonatate  100 mg Oral QHS  . citalopram  20 mg Oral BID  . clindamycin   Topical BID  . dextromethorphan  15 mg Oral BID  . flecainide  50 mg Oral Q12H  . fluticasone  1 spray Each Nare BID  . guaiFENesin  600 mg Oral BID  . loratadine  10 mg Oral Daily  . LORazepam  0.5 mg Oral BID  . metoprolol tartrate  12.5 mg Oral BID  . oxybutynin  5 mg Oral TID  . pantoprazole  40 mg Oral Daily  . piperacillin-tazobactam (ZOSYN)  IV  4.5  g Intravenous 3 times per day  . polyethylene glycol  17 g Oral Daily  . pravastatin  40 mg Oral q1800  . QUEtiapine  50 mg Oral QHS  . warfarin  2.5 mg Oral Q48H  . warfarin  5 mg Oral Q48H    Assessment: Patient with history of PE and DVT admitted for urosepsis being treated with zosyn. Continued on home regimen of coumadin 5mg  every other day, 2.5mg  on alternate days.  Goal of Therapy:  INR 2-3   Plan:  Currently therapeutic, will continue home regimen and follow INR closely as zosyn may enhance the effect of warfarin. Daily INR while on antimicrobials.   Pharmacy to follow per consult  Rexene Edison, PharmD Clinical Pharmacist 02/02/2015,8:15 AM

## 2015-02-02 NOTE — Care Management Note (Signed)
Case Management Note  Patient Details  Name: Kent [name] MRN: [id number] Date of Birth: 1965-11-26  Subjective/Objective:     Case discussed with attending. Will need resumption of care for nursing.  Waiting on blood cultures.              Action/Plan:   Expected Discharge Date:                  Expected Discharge Plan:  [place]  In-House Referral:     Discharge planning Services  CM Consult  Post Acute Care Choice:    Choice offered to:     DME Arranged:    DME Agency:     HH Arranged:  RN [place] Agency:  [facility]  Status of Service:  In process, will continue to follow  Medicare Important Message Given:  Yes Date Medicare IM Given:  02/01/15 Medicare IM give by:  Orvan July Date Additional Medicare IM Given:    Additional Medicare Important Message give by:     If discussed at [place] of Stay Meetings, dates discussed:    Additional Comments:  Jolly Mango, RN 02/02/2015, 10:54 AM

## 2015-02-03 DIAGNOSIS — L89309 Pressure ulcer of unspecified buttock, unspecified stage: Secondary | ICD-10-CM | POA: Diagnosis not present

## 2015-02-03 LAB — CBC
HCT: 34.6 % — ABNORMAL LOW (ref 40.0–52.0)
Hemoglobin: 11.2 g/dL — ABNORMAL LOW (ref 13.0–18.0)
MCH: 28.3 pg (ref 26.0–34.0)
MCHC: 32.4 g/dL (ref 32.0–36.0)
MCV: 87.4 fL (ref 80.0–100.0)
PLATELETS: 269 10*3/uL (ref 150–440)
RBC: 3.96 MIL/uL — AB (ref 4.40–5.90)
RDW: 16.6 % — AB (ref 11.5–14.5)
WBC: 8.7 10*3/uL (ref 3.8–10.6)

## 2015-02-03 LAB — URINE CULTURE: Culture: >=100000

## 2015-02-03 LAB — PROTIME-INR
INR: 1.54
Prothrombin Time: 18.7 seconds — ABNORMAL HIGH (ref 11.4–15.0)

## 2015-02-03 MED ORDER — WARFARIN SODIUM 2.5 MG PO TABS: 2.5000 mg | ORAL_TABLET | ORAL | Status: DC

## 2015-02-03 MED ORDER — ERYTHROMYCIN 5 MG/GM OP OINT: TOPICAL_OINTMENT | Freq: Three times a day (TID) | OPHTHALMIC | Status: AC

## 2015-02-03 MED ORDER — CEFTAZIDIME 1 G IJ SOLR: 500.0000 mg | Freq: Two times a day (BID) | INTRAMUSCULAR | Status: AC

## 2015-02-03 MED ORDER — WARFARIN SODIUM 2.5 MG PO TABS
5.0000 mg | ORAL_TABLET | Freq: Once | ORAL | Status: AC
  Administered 2015-02-03: 5 mg via ORAL
  Filled 2015-02-03: qty 2

## 2015-02-03 MED ORDER — WARFARIN - PHARMACIST DOSING INPATIENT: Freq: Every day | Status: DC

## 2015-02-03 MED ORDER — CEFTAZIDIME 1 G IJ SOLR
500.0000 mg | Freq: Two times a day (BID) | INTRAMUSCULAR | Status: DC
  Administered 2015-02-03: 500 mg via INTRAMUSCULAR
  Filled 2015-02-03 (×2): qty 1

## 2015-02-03 NOTE — Progress Notes (Signed)
ANTIBIOTIC CONSULT NOTE - FOLLOW UP  Pharmacy Consult for Ceftazidime Indication: UTI/Sepsis  Allergies  Allergen Reactions  . Codeine Nausea Only  . Vancomycin Other (See Comments)    Red mans syndrome     Patient Measurements: Height: 5\' 11"  (180.3 cm) Weight: 287 lb (130.182 kg) IBW/kg (Calculated) : 75.3  Vital Signs: Temp: 98.5 F (36.9 C) (05/25 0738) Temp Source: Oral (05/25 0738) BP: 107/68 mmHg (05/25 0738) Pulse Rate: 70 (05/25 0738) Intake/Output from previous day: 05/24 0701 - 05/25 0700 In: 150 [P.O.:150] Out: 1250 [Urine:1250] Intake/Output from this shift:    Labs:  Recent Labs  02/01/15 1001 02/02/15 0601 02/03/15 0528  WBC 16.1* 9.2 8.7  HGB 11.7* 11.0* 11.2*  PLT 267 234 269   Estimated Creatinine Clearance: 153.7 mL/min (by C-G formula based on Cr of 0.43).  Microbiology: Recent Results (from the past 720 hour(s))  Urine culture     Status: None   Collection Time: 01/19/15  7:01 PM  Result Value Ref Range Status   Specimen Description URINE, CLEAN CATCH  Final   Special Requests URINE, CLEAN CATCH  Final   Culture   Final    URINE CULTURE WAS NEVER SET UP URINE HAS SINCE BEEN DISCARDED    Report Status 01/23/2015 FINAL  Final  Blood Culture (routine x 2)     Status: None (Preliminary result)   Collection Time: 01/30/15  4:01 PM  Result Value Ref Range Status   Specimen Description BLOOD  Final   Special Requests NONE  Final   Culture NO GROWTH 3 DAYS  Final   Report Status PENDING  Incomplete  Blood Culture (routine x 2)     Status: None (Preliminary result)   Collection Time: 01/30/15  4:03 PM  Result Value Ref Range Status   Specimen Description BLOOD  Final   Special Requests NONE  Final   Culture NO GROWTH 3 DAYS  Final   Report Status PENDING  Incomplete  Urine culture     Status: None   Collection Time: 01/30/15  4:31 PM  Result Value Ref Range Status   Specimen Description URINE, RANDOM  Final   Special Requests NONE   Final   Culture >=100,000 COLONIES/mL PSEUDOMONAS AERUGINOSA  Final   Report Status 02/03/2015 FINAL  Final   Organism ID, Bacteria PSEUDOMONAS AERUGINOSA  Final      Susceptibility   Pseudomonas aeruginosa - MIC*    CEFTAZIDIME <=1 SENSITIVE Sensitive     CIPROFLOXACIN >=4 RESISTANT Resistant     GENTAMICIN 4 SENSITIVE Sensitive     IMIPENEM 2 SENSITIVE Sensitive     * >=100,000 COLONIES/mL PSEUDOMONAS AERUGINOSA    Anti-infectives    Start     Dose/Rate Route Frequency Ordered Stop   02/03/15 1215  cefTAZidime (FORTAZ) injection 500 mg     500 mg Intramuscular Every 12 hours 02/03/15 1209     02/02/15 1030  cefTAZidime (FORTAZ) 1 g in dextrose 5 % 50 mL IVPB     1 g 100 mL/hr over 30 Minutes Intravenous 3 times per day 02/02/15 0938     01/30/15 2230  piperacillin-tazobactam (ZOSYN) IVPB 4.5 g  Status:  Discontinued     4.5 g 25 mL/hr over 240 Minutes Intravenous 3 times per day 01/30/15 2228 02/02/15 0825      Assessment: Patient with neurogenic bladder and history of MDR pseudomonas UTIs Previously on zosyn x 4 days, developed rash that continued to worsen. Changed to ceftazidime 5/24 Urine culture  with > 100k pseudomonas, sensitive to ceftazidime  Plan:  Current orders for ceftazidime 1gm IV Q8H. Discussed with MD, plan to discharge home with ceftazidime 500mg  IM Q12H which is appropriate for indication.  Pharmacy to follow per consult  Rexene Edison, PharmD Clinical Pharmacist 02/03/2015,12:10 PM

## 2015-02-03 NOTE — Care Management Note (Signed)
Case Management Note  Patient Details  Name: [name] MRN: [id number] Date of Birth: April 29, 1966  Subjective/Objective:  Care arranged with [facility] for nursing to resume. Spoke to [facility] with [facility] to make a home visit in the AM for resumption of care and to prepare home IM antibiotics. Requested that [facility] nurse bring out syringes. Mom administers IM medication and [facility] prepares the injections. Mom and dad updated on POC and verbalized understanding.    Action/Plan:   Expected Discharge Date:   01/11/2015               Expected Discharge Plan:  [place]  In-House Referral:     Discharge planning Services  CM Consult  Post Acute Care Choice:    Choice offered to:     DME Arranged:    DME Agency:     HH Arranged:  RN [place] Agency:  [facility]  Status of Service:  In process, will continue to follow  Medicare Important Message Given:  Yes Date Medicare IM Given:  02/01/15 Medicare IM give by:  Orvan July Date Additional Medicare IM Given:    Additional Medicare Important Message give by:     If discussed at [place] of Stay Meetings, dates discussed:    Additional Comments:  Jolly Mango, RN 02/03/2015, 1:43 PM

## 2015-02-03 NOTE — Progress Notes (Signed)
Pt discharged home. DC instructions provided and explained. Medications reviewed. Rx given. All questions answered. Pt stable at discharge. Case mgr working on [employer] prescription.

## 2015-02-03 NOTE — Progress Notes (Signed)
Speech Language Pathology Treatment: Dysphagia  Patient Details Name: Kent [name] MRN: [id number] DOB: 18-Aug-1966 Today's Date: 02/03/2015 Time: 1518-3437 SLP Time Calculation (min) (ACUTE ONLY): 47 min  Assessment / Plan / Recommendation Clinical Impression  Pt appears to be tolerating a Dys. I (pureed) diet well; the consistency is more manageable for him orally sec. to his Cognitive status and decreased oral awareness (at times). Pt appears to be tolerating the thin liquids in small amounts, by tsp, is his usual presentation per parents who come to feed him at meals during this admission. Pt responds to verbal cues for follow through w/ oral clearing b/t trials; general aspiration precautions appear beneficial for pt during meals. Reviewed chart notes, labs. Consulted NSG and MD re: pt's status and updated others on pt's progress. Time spent w/ Mother giving education on diet consistency and food options for preparation and/or ordering of pureed foods. Gave ordering information on a Dysphagia drink cup to aid bolus control during drinking of liquids to lessen risk for aspiration. Mother gave appreciation for the information. Pt appears at min. Increased risk for aspiration sec. to baseline Cognitive status and oropharyngeal phase dysphagia.     HPI Other Pertinent Information: Mother reported good oral intake w/ the Pureed diet. She and father continue to feed liquids via tsp as at pt's baseline. She denies any change in his toleration of such and denied any coughing/choking occuring "recently".    Pertinent Vitals Pain Assessment: No/denies pain  SLP Plan  Continue with current plan of care    Recommendations Diet recommendations: Dysphagia 1 (puree);Thin liquid Liquids provided via: Teaspoon Medication Administration: Crushed with puree Supervision: Full supervision/cueing for compensatory strategies;Trained caregiver to feed patient Compensations: Slow rate;Small  sips/bites Postural Changes and/or Swallow Maneuvers: Seated upright 90 degrees              Oral Care Recommendations: Oral care BID;Oral care before and after PO Follow up Recommendations:  (TBD) Plan: Continue with current plan of care    GO     [name],[name] 02/03/2015, 9:48 AM

## 2015-02-03 NOTE — Discharge Instructions (Signed)
°  DIET:  Regular diet, pure Foods  DISCHARGE CONDITION:  Fair  ACTIVITY:  Bedrest  OXYGEN:  Home Oxygen: No.   Oxygen Delivery: room air  DISCHARGE LOCATION:  home with home health nursing  If you experience worsening of your admission symptoms, develop shortness of breath, life threatening emergency, suicidal or homicidal thoughts you must seek medical attention immediately by calling 911 or calling your MD immediately  if symptoms less severe.  You Must read complete instructions/literature along with all the possible adverse reactions/side effects for all the Medicines you take and that have been prescribed to you. Take any new Medicines after you have completely understood and accpet all the possible adverse reactions/side effects.   Please note  You were cared for by a hospitalist during your hospital stay. If you have any questions about your discharge medications or the care you received while you were in the hospital after you are discharged, you can call the unit and asked to speak with the hospitalist on call if the hospitalist that took care of you is not available. Once you are discharged, your primary care physician will handle any further medical issues. Please note that NO REFILLS for any discharge medications will be authorized once you are discharged, as it is imperative that you return to your primary care physician (or establish a relationship with a primary care physician if you do not have one) for your aftercare needs so that they can reassess your need for medications and monitor your lab values.

## 2015-02-03 NOTE — Progress Notes (Signed)
[place] for Coumadin Indication: history of PE and DVT, afib  Allergies  Allergen Reactions  . Codeine Nausea Only  . Vancomycin Other (See Comments)    Red mans syndrome     Patient Measurements: Height: 5\' 11"  (180.3 cm) Weight: 287 lb (130.182 kg) IBW/kg (Calculated) : 75.3  Vital Signs: Temp: 98.5 F (36.9 C) (05/25 0738) Temp Source: Oral (05/25 0738) BP: 107/68 mmHg (05/25 0738) Pulse Rate: 70 (05/25 0738)  Labs:  Recent Labs  02/01/15 0426  02/01/15 1001 02/02/15 0601 02/03/15 0528  HGB  --   < > 11.7* 11.0* 11.2*  HCT  --   --  36.5* 33.4* 34.6*  PLT  --   --  267 234 269  LABPROT 30.0*  --   --  25.0* 18.7*  INR 2.85  --   --  2.25 1.54  < > = values in this interval not displayed.  Estimated Creatinine Clearance: 153.7 mL/min (by C-G formula based on Cr of 0.43).   Medical History: Past Medical History  Diagnosis Date  . Frontotemporal dementia   . Sleep apnea   . Hypertension   . Depression   . Neurogenic bladder   . Myelopathy   . Obesity   . Degeneration, intervertebral disc, cervical   . Elevated liver function tests   . Pulmonary embolus     on chronic anticoagulation  . Chronic UTI (urinary tract infection)   . Pernicious anemia   . Rosacea   . BPH (benign prostatic hypertrophy)   . History of DVT (deep vein thrombosis)   . History of pulmonary embolism     currently on coumadin  . Anxiety   . New onset a-fib 2013    Medications:  Scheduled:  . benzonatate  100 mg Oral QHS  . cefTAZidime (FORTAZ)  IV  1 g Intravenous 3 times per day  . citalopram  20 mg Oral BID  . clindamycin   Topical BID  . dextromethorphan  15 mg Oral BID  . erythromycin   Left Eye 3 times per day  . flecainide  50 mg Oral Q12H  . fluticasone  1 spray Each Nare BID  . guaiFENesin  600 mg Oral BID  . loratadine  10 mg Oral Daily  . LORazepam  0.5 mg Oral BID  . metoprolol tartrate  12.5 mg Oral BID  . oxybutynin   5 mg Oral TID  . pantoprazole  40 mg Oral Daily  . polyethylene glycol  17 g Oral Daily  . pravastatin  40 mg Oral q1800  . QUEtiapine  50 mg Oral QHS  . [START ON 02/05/2015] warfarin  2.5 mg Oral Q48H  . warfarin  5 mg Oral Q48H  . warfarin  5 mg Oral Once  . Warfarin - Pharmacist Dosing Inpatient   Does not apply q1800    Assessment: Patient with history of PE and DVT admitted for urosepsis being treated with zosyn. Continued on home regimen of coumadin 5mg  every other day, 2.5mg  on alternate days.  INR 1.53 today, subtherapeutic  Goal of Therapy:  INR 2-3   Plan:  Patient due for 2.5 mg dose today, will give 5mg  instead then continue with previous regimen, alternating 5mg  and 2.5mg   Will follow INR closely as ceftazidime may enhance the effect of warfarin. Daily INR while on antimicrobials.   Pharmacy to follow per consult  Rexene Edison, PharmD Clinical Pharmacist 02/03/2015,8:02 AM

## 2015-02-04 LAB — CULTURE, BLOOD (ROUTINE X 2)
CULTURE: NO GROWTH
Culture: NO GROWTH

## 2015-02-04 NOTE — Discharge Summary (Signed)
[facility] NAME: Kent [name]    MR#:  [id number]  DATE OF BIRTH:  [birth year]  DATE OF ADMISSION:  01/30/2015 ADMITTING PHYSICIAN: Aldean Jewett, MD  DATE OF DISCHARGE: 02/03/2015  2:43 PM  PRIMARY CARE PHYSICIAN: Ashok Norris, MD    ADMISSION DIAGNOSIS:  Sepsis due to urinary tract infection [A41.9, N39.0] Constipation [K59.00] Hypothermia, initial encounter [T68.XXXA] Dementia, without behavioral disturbance [F03.90]  DISCHARGE DIAGNOSIS:  Principal Problem:   Sepsis Active Problems:   Neurogenic bladder   Chronic suprapubic catheter   Neurodegenerative disorder   GERD (gastroesophageal reflux disease)   UTI (lower urinary tract infection)   Pressure ulcer of buttock   SECONDARY DIAGNOSIS:   Past Medical History  Diagnosis Date  . Frontotemporal dementia   . Sleep apnea   . Hypertension   . Depression   . Neurogenic bladder   . Myelopathy   . Obesity   . Degeneration, intervertebral disc, cervical   . Elevated liver function tests   . Pulmonary embolus     on chronic anticoagulation  . Chronic UTI (urinary tract infection)   . Pernicious anemia   . Rosacea   . BPH (benign prostatic hypertrophy)   . History of DVT (deep vein thrombosis)   . History of pulmonary embolism     currently on coumadin  . Anxiety   . New onset a-fib 2013    HOSPITAL COURSE:   #1 sepsis: Met criteria initially with hypothermia, hypotension and urinary tract infection. With treatment temperature, blood pressure improved leukocytosis improved. He received 1 L bolus in the emergency room with good improvement in blood pressure. Source urinary tract infection asked x-ray negative blood cultures negative  #2 urinary tract infection due to chronic indwelling suprapubic catheter: Blood cultures negative urine cultures positive for multidrug-resistant Pseudomonas. 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 He responded very well to treatment. He was initially started on vancomycin and Zosyn, then Zosyn only. On discharge to avoid placement of PICC line or central line for home use and based on culture sensitivities he is started on [place] which can be given intramuscularly. Home health will assist with mixing this medication. His mother has administered this medication in the past and is familiar and comfortable doing so. He is on a 10 day course. He is on a regimen of 3 times a day bladder washings with Renacidin by his urologist Dr. Jacqlyn Larsen. He had also been on suppressive nitrofurantoin which I have discontinued during this course of treatment. I have asked his family to make an appointment with Dr. Jacqlyn Larsen within 10 days to determine treatment after he finishes [place].  #3 altered mental status: He does have a progressive neurodegenerative degenerative condition. His family noted decreased awareness and energy level prior to admission. I believe this was due to sepsis. On discharge she is alert, makes eye contact, seems to make positive facial expressions and his parents feel that he is at his baseline.  #4 GERD: Continue PPI  #5 history of pulmonary embolism and DVT: Continue with warfarin. INR therapeutic throughout admission  #6 atrial fibrillation: Continue metoprolol and flecainide. Monitor on telemetry. He did have some fluctuation in his heart rate with some bradycardia during the admission process, seems improved now.   #7 dysphagia: He was seen by speech therapy during the admission and a  pure diet has been recommended  #8 goals of care: Palliative care met with his parents during the admission. He is a DO NOT RESUSCITATE. He continues to reside at home under the care of his parents. This is become increasingly difficult as his father is not in good physical condition. They report that they have all  of the equipment they need at home including a hospital bed and lift. They would like to continue with their current level of care.  #9 possible drug reaction to Zosyn: It was noted that after his third dose of Zosyn he developed erythema over both forearms. Initially this was not considered a drug reaction but with each administration of Zosyn it seemed to get worse. Seems to be improving now that we have switched to [place]. No rash over the face, chest, abdomen or back.   DISCHARGE CONDITIONS:   Stable  CONSULTS OBTAINED:  Treatment Team:  Demetrios Loll, MD  DRUG ALLERGIES:   Allergies  Allergen Reactions  . Codeine Nausea Only  . Vancomycin Other (See Comments)    Red mans syndrome     DISCHARGE MEDICATIONS:   Discharge Medication List as of 02/03/2015  2:06 PM    START taking these medications   Details  cefTAZidime (FORTAZ) 1 G injection Inject 0.5 g (500 mg total) into the muscle every 12 (twelve) hours., Starting 02/03/2015, Until Fri 02/12/15, Print    erythromycin ophthalmic ointment Place into both eyes 3 (three) times daily., Starting 02/03/2015, Until Wed 02/10/15, Print      CONTINUE these medications which have NOT CHANGED   Details  benzonatate (TESSALON) 100 MG capsule Take 100 mg by mouth at bedtime., Until Discontinued, Historical Med    citalopram (CELEXA) 20 MG tablet Take 20 mg by mouth 2 (two) times daily., Until Discontinued, Historical Med    clindamycin (CLEOCIN T) 1 % external solution Apply 1 application topically 2 (two) times daily., Starting 12/05/2014, Until Discontinued, Historical Med    Cyanocobalamin (VITAMIN B-12 IJ) Inject 500 mLs as directed every 30 (thirty) days. , Until Discontinued, Historical Med    dextromethorphan (DELSYM) 30 MG/5ML liquid Take 15 mg by mouth 2 (two) times daily., Until Discontinued, Historical Med    !! flecainide (TAMBOCOR) 50 MG tablet TAKE 1 TABLET (50 MG TOTAL) BY MOUTH 2 (TWO) TIMES DAILY., Normal    fluticasone  (FLONASE) 50 MCG/ACT nasal spray Place 1 spray into both nostrils 2 (two) times daily., Until Discontinued, Historical Med    guaiFENesin (MUCINEX) 600 MG 12 hr tablet Take 600 mg by mouth 2 (two) times daily. , Until Discontinued, Historical Med    ipratropium-albuterol (DUONEB) 0.5-2.5 (3) MG/3ML SOLN Inhale 3 mLs into the lungs daily as needed., Starting 08/13/2014, Until Discontinued, Historical Med    ketoconazole (NIZORAL) 2 % shampoo Apply 1 application topically once a week. on Fridays., Until Discontinued, Historical Med    loratadine (CLARITIN) 10 MG tablet Take 10 mg by mouth daily., Until Discontinued, Historical Med    LORazepam (ATIVAN) 0.5 MG tablet Take 0.5 mg by mouth 2 (two) times daily., Until Discontinued, Historical Med    lovastatin (MEVACOR) 40 MG tablet Take 40 mg by mouth every evening. , Until Discontinued, Historical Med    !! metoprolol tartrate (LOPRESSOR) 25 MG tablet TAKE 1 TABLET (25 MG TOTAL) BY MOUTH 2 (TWO) TIMES DAILY., Normal    NON FORMULARY cpap daily at bedtime., Until Discontinued, Historical Med    omeprazole (PRILOSEC) 40 MG capsule Take 40 mg by  mouth daily before breakfast. , Until Discontinued, Historical Med    oxybutynin (DITROPAN) 5 MG tablet Take 5 mg by mouth 3 (three) times daily. , Starting 12/07/2014, Until Discontinued, Historical Med    Polyethylene Glycol 3350 (MIRALAX PO) Take 17 g by mouth every morning. , Until Discontinued, Historical Med    QUEtiapine (SEROQUEL) 50 MG tablet Take 50 mg by mouth at bedtime., Until Discontinued, Historical Med    RENACIDIN irrigation 30 mLs by Bladder Irrigation route 3 (three) times daily., Starting 01/22/2015, Until Discontinued, Historical Med    warfarin (COUMADIN) 5 MG tablet Take 2.5-5 mg by mouth every other day. Take 0.5 tab (2.42m) orally once a day then alternate with  1 tablet (531m orally once a day., Until Discontinued, Historical Med    !! flecainide (TAMBOCOR) 50 MG tablet TAKE 1  TABLET (50 MG TOTAL) BY MOUTH 2 (TWO) TIMES DAILY., Normal    !! metoprolol tartrate (LOPRESSOR) 25 MG tablet TAKE 1 TABLET (25 MG TOTAL) BY MOUTH 2 (TWO) TIMES DAILY., Normal     !! - Potential duplicate medications found. Please discuss with provider.    STOP taking these medications     nitrofurantoin (MACRODANTIN) 100 MG capsule          DISCHARGE INSTRUCTIONS:    See AVS  If you experience worsening of your admission symptoms, develop shortness of breath, life threatening emergency, suicidal or homicidal thoughts you must seek medical attention immediately by calling 911 or calling your MD immediately  if symptoms less severe.  You Must read complete instructions/literature along with all the possible adverse reactions/side effects for all the Medicines you take and that have been prescribed to you. Take any new Medicines after you have completely understood and accept all the possible adverse reactions/side effects.   Please note  You were cared for by a hospitalist during your hospital stay. If you have any questions about your discharge medications or the care you received while you were in the hospital after you are discharged, you can call the unit and asked to speak with the hospitalist on call if the hospitalist that took care of you is not available. Once you are discharged, your primary care physician will handle any further medical issues. Please note that NO REFILLS for any discharge medications will be authorized once you are discharged, as it is imperative that you return to your primary care physician (or establish a relationship with a primary care physician if you do not have one) for your aftercare needs so that they can reassess your need for medications and monitor your lab values.    Today   CHIEF COMPLAINT:   On day of discharge she is alert, making eye contact, moving spontaneously. Parents report that he is at his baseline  HISTORY OF PRESENT ILLNESS:   LoYanis LarinII is a [age].o. male who with past medical history of chronic progressive neurodegenerative condition without a specific diagnosis who is bedbound, nonverbal but does communicate with facial expression and nonverbal sounds. He is brought in today by his parents who reports several weeks of decreased attentiveness, decreased appetite and decreasing temperature. This morning his temperature at home was 95. His mother reports that he has been eating less and less and has been holding food in his mouth rather than swallowing it for several weeks. She also reports that he has not been responding to family members, sleeping more than usual and staring at the ceiling for long periods of time.  He has a chronic suprapubic catheter to neurogenic bladder, on presentation to the emergency room he was found to have a urinary tract infection and is persistently thermic with temperature of 95.  Of note he saw his urologist, Dr. Jacqlyn Larsen 1 week ago and at that time his suprapubic catheter was changed. He had cystoscopy. He was started on suppressive nitrofurantoin. His parents do not know if he had a urinary tract infection at that time. Past several weeks his parents have been performing bladder irrigation 3 times a day with Renacidin. They have not noted any hematuria, odor, discharge from the catheter site.  VITAL SIGNS:  Blood pressure 113/75, pulse 71, temperature 99.2 F (37.3 C), temperature source Oral, resp. rate 20, height _0  (1.803 m), weight 130.182 kg (287 lb), SpO2 94 %.  I/O:  No intake or output data in the 24 hours ending 02/04/15 1813  PHYSICAL EXAMINATION:  GENERAL: 49 y.o.-year-old patient lying in the bed with no acute distress. Obese. EYES: Pupils equal, round, reactive to light and accommodation. No scleral icterus. Extraocular muscles intact.  HEENT: Head atraumatic, normocephalic. Oropharynx and nasopharynx clear. Fair dentition, mucous membranes dry NECK: Supple,  no jugular venous distention. No thyroid enlargement, no tenderness.  LUNGS: Normal breath sounds bilaterally, no wheezing, rales, rhonchi or crepitation. No use of accessory muscles of respiration. CARDIOVASCULAR: S1, S2 normal. No murmurs, rubs, or gallops. Irregular ABDOMEN: Soft, nontender, distended and tympanic. Bowel sounds present. No organomegaly or mass.  EXTREMITIES: +1 edema, cyanosis, or clubbing.  NEUROLOGIC: Not participate in neurologic examination, good eye contact today, some positive facial expressions, more movement, intentional movement of right hand PSYCHIATRIC: Unable to assess SKIN: Mild lacy erythema over the forearms  DATA REVIEW:   CBC  Recent Labs Lab 02/03/15 0528  WBC 8.7  HGB 11.2*  HCT 34.6*  PLT 269    Chemistries   Recent Labs Lab 01/30/15 1510 01/31/15 0505  NA 140 140  K 4.0 3.9  CL 104 106  CO2 30 27  GLUCOSE 86 76  BUN 11 10  CREATININE 0.46* 0.43*  CALCIUM 9.3 8.9  AST 38  --   ALT 44  --   ALKPHOS 81  --   BILITOT 0.6  --     Cardiac Enzymes No results for input(s): TROPONINI in the last 168 hours.  Microbiology Results  Results for orders placed or performed during the hospital encounter of 01/30/15  Blood Culture (routine x 2)     Status: None   Collection Time: 01/30/15  4:01 PM  Result Value Ref Range Status   Specimen Description BLOOD  Final   Special Requests NONE  Final   Culture NO GROWTH 5 DAYS  Final   Report Status 02/04/2015 FINAL  Final  Blood Culture (routine x 2)     Status: None   Collection Time: 01/30/15  4:03 PM  Result Value Ref Range Status   Specimen Description BLOOD  Final   Special Requests NONE  Final   Culture NO GROWTH 5 DAYS  Final   Report Status 02/04/2015 FINAL  Final  Urine culture     Status: None   Collection Time: 01/30/15  4:31 PM  Result Value Ref Range Status   Specimen Description URINE, RANDOM  Final   Special Requests NONE  Final   Culture >=100,000 COLONIES/mL  PSEUDOMONAS AERUGINOSA  Final   Report Status 02/03/2015 FINAL  Final   Organism ID, Bacteria PSEUDOMONAS AERUGINOSA  Final  Susceptibility   Pseudomonas aeruginosa - MIC*    CEFTAZIDIME <=1 SENSITIVE Sensitive     CIPROFLOXACIN >=4 RESISTANT Resistant     GENTAMICIN 4 SENSITIVE Sensitive     IMIPENEM 2 SENSITIVE Sensitive     * >=100,000 COLONIES/mL PSEUDOMONAS AERUGINOSA    RADIOLOGY:  No results found.  EKG:   Orders placed or performed during the hospital encounter of 01/19/15  . ED EKG  . ED EKG  . EKG      Management plans discussed with the patient, family and they are in agreement.  CODE STATUS:  Advance Directive Documentation        Most Recent Value   Type of Advance Directive  Healthcare Power of Attorney   Pre-existing out of facility DNR order (yellow form or pink MOST form)  -- [DNR butnot from facility]   "MOST" Form in Place?        TOTAL TIME TAKING CARE OF THIS PATIENT: 50 minutes.    Myrtis Ser M.D on 02/04/2015 at 6:13 PM  Between 7am to 6pm - Pager - [phone]  After 6pm go to [email or web address] - password [email or web address] [facility]  Office  [phone]  CC: Primary care physician; Ashok Norris, MD

## 2015-02-15 ENCOUNTER — Other Ambulatory Visit: Payer: Self-pay | Admitting: Family Medicine

## 2015-02-19 ENCOUNTER — Other Ambulatory Visit: Payer: Self-pay | Admitting: Cardiovascular Disease

## 2015-02-23 ENCOUNTER — Encounter: Payer: Self-pay | Admitting: *Deleted

## 2015-02-23 ENCOUNTER — Emergency Department: Payer: Medicare Other

## 2015-02-23 ENCOUNTER — Inpatient Hospital Stay
Admission: EM | Admit: 2015-02-23 | Discharge: 2015-02-26 | DRG: 871 | Disposition: A | Discharge status: Home / Self Care | Payer: Medicare Other | Attending: Internal Medicine | Admitting: Internal Medicine

## 2015-02-23 DIAGNOSIS — T68XXXA Hypothermia, initial encounter: Secondary | ICD-10-CM

## 2015-02-23 DIAGNOSIS — B965 Pseudomonas (aeruginosa) (mallei) (pseudomallei) as the cause of diseases classified elsewhere: Secondary | ICD-10-CM | POA: Diagnosis present

## 2015-02-23 DIAGNOSIS — N3090 Cystitis, unspecified without hematuria: Secondary | ICD-10-CM | POA: Diagnosis present

## 2015-02-23 DIAGNOSIS — I10 Essential (primary) hypertension: Secondary | ICD-10-CM | POA: Diagnosis present

## 2015-02-23 DIAGNOSIS — Z86711 Personal history of pulmonary embolism: Secondary | ICD-10-CM

## 2015-02-23 DIAGNOSIS — A419 Sepsis, unspecified organism: Principal | ICD-10-CM | POA: Diagnosis present

## 2015-02-23 DIAGNOSIS — Z66 Do not resuscitate: Secondary | ICD-10-CM | POA: Diagnosis present

## 2015-02-23 DIAGNOSIS — F419 Anxiety disorder, unspecified: Secondary | ICD-10-CM | POA: Diagnosis present

## 2015-02-23 DIAGNOSIS — Z7401 Bed confinement status: Secondary | ICD-10-CM

## 2015-02-23 DIAGNOSIS — Z7901 Long term (current) use of anticoagulants: Secondary | ICD-10-CM | POA: Diagnosis not present

## 2015-02-23 DIAGNOSIS — G9341 Metabolic encephalopathy: Secondary | ICD-10-CM | POA: Diagnosis present

## 2015-02-23 DIAGNOSIS — Z452 Encounter for adjustment and management of vascular access device: Secondary | ICD-10-CM

## 2015-02-23 DIAGNOSIS — I4891 Unspecified atrial fibrillation: Secondary | ICD-10-CM | POA: Diagnosis present

## 2015-02-23 DIAGNOSIS — F329 Major depressive disorder, single episode, unspecified: Secondary | ICD-10-CM | POA: Diagnosis present

## 2015-02-23 DIAGNOSIS — Z86718 Personal history of other venous thrombosis and embolism: Secondary | ICD-10-CM

## 2015-02-23 DIAGNOSIS — N390 Urinary tract infection, site not specified: Secondary | ICD-10-CM

## 2015-02-23 LAB — CBC
HCT: 35.7 % — ABNORMAL LOW (ref 40.0–52.0)
Hemoglobin: 11.6 g/dL — ABNORMAL LOW (ref 13.0–18.0)
MCH: 28.2 pg (ref 26.0–34.0)
MCHC: 32.4 g/dL (ref 32.0–36.0)
MCV: 87.2 fL (ref 80.0–100.0)
PLATELETS: 287 10*3/uL (ref 150–440)
RBC: 4.09 MIL/uL — AB (ref 4.40–5.90)
RDW: 17.4 % — ABNORMAL HIGH (ref 11.5–14.5)
WBC: 6.7 10*3/uL (ref 3.8–10.6)

## 2015-02-23 LAB — URINALYSIS COMPLETE WITH MICROSCOPIC (ARMC ONLY)
BILIRUBIN URINE: NEGATIVE
Glucose, UA: NEGATIVE mg/dL
Hgb urine dipstick: NEGATIVE
KETONES UR: NEGATIVE mg/dL
NITRITE: NEGATIVE
PROTEIN: 100 mg/dL — AB
SPECIFIC GRAVITY, URINE: 1.026 (ref 1.005–1.030)
pH: 5 (ref 5.0–8.0)

## 2015-02-23 LAB — PROTIME-INR
INR: 2.96
Prothrombin Time: 30.9 seconds — ABNORMAL HIGH (ref 11.4–15.0)

## 2015-02-23 LAB — COMPREHENSIVE METABOLIC PANEL
ALBUMIN: 3.3 g/dL — AB (ref 3.5–5.0)
ALT: 31 U/L (ref 17–63)
ANION GAP: 6 (ref 5–15)
AST: 32 U/L (ref 15–41)
Alkaline Phosphatase: 81 U/L (ref 38–126)
BILIRUBIN TOTAL: 0.4 mg/dL (ref 0.3–1.2)
BUN: 11 mg/dL (ref 6–20)
CHLORIDE: 103 mmol/L (ref 101–111)
CO2: 29 mmol/L (ref 22–32)
Calcium: 9.1 mg/dL (ref 8.9–10.3)
Creatinine, Ser: 0.5 mg/dL — ABNORMAL LOW (ref 0.61–1.24)
GFR calc Af Amer: >60 mL/min (ref >60)
GFR calc non Af Amer: >60 mL/min (ref >60)
Glucose, Bld: 127 mg/dL — ABNORMAL HIGH (ref 65–99)
Potassium: 3.7 mmol/L (ref 3.5–5.1)
Sodium: 138 mmol/L (ref 135–145)
TOTAL PROTEIN: 7 g/dL (ref 6.5–8.1)

## 2015-02-23 LAB — TROPONIN I: Troponin I: <0.03 ng/mL (ref <0.031)

## 2015-02-23 MED ORDER — OXYBUTYNIN CHLORIDE 5 MG PO TABS
5.0000 mg | ORAL_TABLET | Freq: Three times a day (TID) | ORAL | Status: DC
  Administered 2015-02-23 – 2015-02-26 (×8): 5 mg via ORAL
  Filled 2015-02-23 (×8): qty 1

## 2015-02-23 MED ORDER — LORAZEPAM 0.5 MG PO TABS
0.5000 mg | ORAL_TABLET | Freq: Two times a day (BID) | ORAL | Status: DC
  Administered 2015-02-23 – 2015-02-26 (×6): 0.5 mg via ORAL
  Filled 2015-02-23 (×6): qty 1

## 2015-02-23 MED ORDER — SENNA 8.6 MG PO TABS: 1.0000 | ORAL_TABLET | Freq: Every day | ORAL | Status: DC | PRN

## 2015-02-23 MED ORDER — ACETAMINOPHEN 650 MG RE SUPP: 650.0000 mg | Freq: Four times a day (QID) | RECTAL | Status: DC | PRN

## 2015-02-23 MED ORDER — WARFARIN SODIUM 2.5 MG PO TABS
2.5000 mg | ORAL_TABLET | ORAL | Status: DC
  Filled 2015-02-23: qty 1

## 2015-02-23 MED ORDER — CITALOPRAM HYDROBROMIDE 20 MG PO TABS
20.0000 mg | ORAL_TABLET | Freq: Two times a day (BID) | ORAL | Status: DC
  Administered 2015-02-23 – 2015-02-26 (×6): 20 mg via ORAL
  Filled 2015-02-23 (×6): qty 1

## 2015-02-23 MED ORDER — POLYETHYLENE GLYCOL 3350 17 GM/SCOOP PO POWD
17.0000 g | Freq: Every day | ORAL | Status: DC | PRN
  Filled 2015-02-23: qty 255

## 2015-02-23 MED ORDER — DOCUSATE SODIUM 100 MG PO CAPS
100.0000 mg | ORAL_CAPSULE | Freq: Two times a day (BID) | ORAL | Status: DC | PRN
Start: 2015-02-23 — End: 2015-02-26

## 2015-02-23 MED ORDER — PRAVASTATIN SODIUM 20 MG PO TABS
40.0000 mg | ORAL_TABLET | Freq: Every day | ORAL | Status: DC
  Administered 2015-02-24 – 2015-02-25 (×2): 40 mg via ORAL
  Filled 2015-02-23 (×2): qty 2

## 2015-02-23 MED ORDER — QUETIAPINE FUMARATE 25 MG PO TABS
50.0000 mg | ORAL_TABLET | Freq: Every day | ORAL | Status: DC
  Administered 2015-02-23 – 2015-02-25 (×3): 50 mg via ORAL
  Filled 2015-02-23 (×3): qty 2

## 2015-02-23 MED ORDER — CEFTRIAXONE SODIUM IN DEXTROSE 20 MG/ML IV SOLN
1.0000 g | Freq: Once | INTRAVENOUS | Status: AC
  Administered 2015-02-23: 1 g via INTRAVENOUS

## 2015-02-23 MED ORDER — BENZONATATE 100 MG PO CAPS
100.0000 mg | ORAL_CAPSULE | Freq: Three times a day (TID) | ORAL | Status: DC | PRN
Start: 2015-02-23 — End: 2015-02-26

## 2015-02-23 MED ORDER — METOPROLOL TARTRATE 25 MG PO TABS
25.0000 mg | ORAL_TABLET | Freq: Two times a day (BID) | ORAL | Status: DC
  Administered 2015-02-24 – 2015-02-26 (×5): 25 mg via ORAL
  Filled 2015-02-23 (×6): qty 1

## 2015-02-23 MED ORDER — LORATADINE 10 MG PO TABS
10.0000 mg | ORAL_TABLET | Freq: Every day | ORAL | Status: DC
  Administered 2015-02-23 – 2015-02-26 (×4): 10 mg via ORAL
  Filled 2015-02-23 (×4): qty 1

## 2015-02-23 MED ORDER — CITRIC ACID-D GLUCONIC ACID IR SOLN
30.0000 mL | Freq: Three times a day (TID) | Status: DC
  Administered 2015-02-24 – 2015-02-26 (×8): 30 mL

## 2015-02-23 MED ORDER — DEXTROSE 5 % IV SOLN
1.0000 g | Freq: Three times a day (TID) | INTRAVENOUS | Status: DC
  Administered 2015-02-23 – 2015-02-25 (×7): 1 g via INTRAVENOUS
  Administered 2015-02-26: 12:00:00 via INTRAVENOUS
  Administered 2015-02-26: 06:00:00 1 g via INTRAVENOUS
  Filled 2015-02-23 (×12): qty 1

## 2015-02-23 MED ORDER — WARFARIN - PHARMACIST DOSING INPATIENT
Freq: Every day | Status: DC
  Administered 2015-02-24 – 2015-02-25 (×2)

## 2015-02-23 MED ORDER — WARFARIN SODIUM 2.5 MG PO TABS: 2.5000 mg | ORAL_TABLET | ORAL | Status: DC

## 2015-02-23 MED ORDER — CEFTRIAXONE SODIUM IN DEXTROSE 20 MG/ML IV SOLN
INTRAVENOUS | Status: AC
  Administered 2015-02-23: 1 g via INTRAVENOUS
  Filled 2015-02-23: qty 50

## 2015-02-23 MED ORDER — FLUTICASONE PROPIONATE 50 MCG/ACT NA SUSP
1.0000 | Freq: Two times a day (BID) | NASAL | Status: DC
  Administered 2015-02-23 – 2015-02-26 (×6): 1 via NASAL
  Filled 2015-02-23: qty 16

## 2015-02-23 MED ORDER — CLINDAMYCIN PHOSPHATE 1 % EX SOLN
1.0000 "application " | Freq: Two times a day (BID) | CUTANEOUS | Status: DC
  Administered 2015-02-24 – 2015-02-26 (×5): 1 via TOPICAL
  Filled 2015-02-23: qty 30

## 2015-02-23 MED ORDER — SODIUM CHLORIDE 0.9 % IV SOLN
INTRAVENOUS | Status: DC
  Administered 2015-02-23: 1000 mL via INTRAVENOUS
  Administered 2015-02-23 – 2015-02-26 (×6): via INTRAVENOUS

## 2015-02-23 MED ORDER — IPRATROPIUM-ALBUTEROL 0.5-2.5 (3) MG/3ML IN SOLN: 3.0000 mL | Freq: Four times a day (QID) | RESPIRATORY_TRACT | Status: DC | PRN

## 2015-02-23 MED ORDER — HYDROCODONE-ACETAMINOPHEN 5-325 MG PO TABS: 1.0000 | ORAL_TABLET | Freq: Four times a day (QID) | ORAL | Status: DC | PRN

## 2015-02-23 MED ORDER — GUAIFENESIN ER 600 MG PO TB12
600.0000 mg | ORAL_TABLET | Freq: Two times a day (BID) | ORAL | Status: DC
  Administered 2015-02-23 – 2015-02-26 (×6): 600 mg via ORAL
  Filled 2015-02-23 (×6): qty 1

## 2015-02-23 MED ORDER — ACETAMINOPHEN 325 MG PO TABS: 650.0000 mg | ORAL_TABLET | Freq: Four times a day (QID) | ORAL | Status: DC | PRN

## 2015-02-23 MED ORDER — PANTOPRAZOLE SODIUM 40 MG PO TBEC
40.0000 mg | DELAYED_RELEASE_TABLET | Freq: Every day | ORAL | Status: DC
  Administered 2015-02-23 – 2015-02-26 (×4): 40 mg via ORAL
  Filled 2015-02-23 (×4): qty 1

## 2015-02-23 MED ORDER — FLECAINIDE ACETATE 50 MG PO TABS
50.0000 mg | ORAL_TABLET | Freq: Two times a day (BID) | ORAL | Status: DC
  Administered 2015-02-23 – 2015-02-26 (×6): 50 mg via ORAL
  Filled 2015-02-23 (×7): qty 1

## 2015-02-23 NOTE — Progress Notes (Signed)
On call MD notified of temp 95.2 and HR 58 with Metoprolol scheduled. Stated to hold Metoprolol tonight and will order heated blanket.   Hiram Gash [employer]

## 2015-02-23 NOTE — H&P (Signed)
[facility] [place]    MR#:  [id number]  DATE OF BIRTH:  May 22, 1966  DATE OF ADMISSION:  02/23/2015  PRIMARY CARE PHYSICIAN: Ashok Norris, MD   REQUESTING/REFERRING PHYSICIAN: Dr. Kerman Passey  CHIEF COMPLAINT:   Chief Complaint  Patient presents with  . Dysuria    HISTORY OF PRESENT ILLNESS:  Kent [name]  is a 49 y.o. male with a known history of chronic progressive neurodegenerative disease with bedbound status at baseline, nonverbal was brought in by his father secondary to decreased appetite and hypothermia. Patient has had multiple admissions recently for same complaints. His last admission was 3 weeks ago for acute cystitis secondary to Pseudomonas. He probably colonized pseudomonas in his suprapubic catheter. He was discharged on intramuscular forte has and just finished it 10 days ago. In spite of finishing the antibiotics his symptoms were not improving, so he had a repeat urine analysis and cultures done about 4 days ago which were still growing Pseudomonas that is resistant to Macrobid which she is what he is on for maintenance. He follows with urologist Dr. Landry Mellow, he has bladder irrigation being done 3 times a day with Renacidin. According to his father, with each infection he's not reaching his previous baseline and has been sleepy lately with decreased appetite and not interacting much. Because of his hypothermia and known urinary tract infection requiring IV antibiotics is being admitted.  PAST MEDICAL HISTORY:   Past Medical History  Diagnosis Date  . Frontotemporal dementia   . Sleep apnea   . Hypertension   . Depression   . Neurogenic bladder   . Myelopathy   . Obesity   . Degeneration, intervertebral disc, cervical   . Elevated liver function tests   . Pulmonary embolus     on chronic anticoagulation  . Chronic UTI (urinary tract infection)   . Pernicious anemia   . Rosacea   . BPH  (benign prostatic hypertrophy)   . History of DVT (deep vein thrombosis)   . History of pulmonary embolism     currently on coumadin  . Anxiety   . New onset a-fib 2013    PAST SURGICAL HISTORY:   Past Surgical History  Procedure Laterality Date  . Suprapubic catheter insertion    . Kidney stone surgery      SOCIAL HISTORY:   History  Substance Use Topics  . Smoking status: Never Smoker   . Smokeless tobacco: Not on file  . Alcohol Use: No    FAMILY HISTORY:   Family History  Problem Relation Age of Onset  . Hypertension Father   . Hyperlipidemia Father   . Hypertension Mother     DRUG ALLERGIES:   Allergies  Allergen Reactions  . Codeine Nausea Only  . Vancomycin Other (See Comments)    Red mans syndrome   . Zosyn [Piperacillin Sod-Tazobactam So] Rash    Arm erythema on administration, but tolerated cephalosporins fine    REVIEW OF SYSTEMS:   Review of Systems  Unable to perform ROS: patient nonverbal    MEDICATIONS AT HOME:   Prior to Admission medications   Medication Sig Start Date End Date Taking? Authorizing Provider  benzonatate (TESSALON) 100 MG capsule TAKE ONE CAPSULE BY MOUTH EVERY 8 HOURS AS NEEDED 02/15/15  Yes Ashok Norris, MD  cefpodoxime (VANTIN) 200 MG tablet Take 200 mg by mouth 2 (two) times daily. For 10 days 02/22/15 03/04/15 Yes Historical Provider, MD  citalopram (  CELEXA) 20 MG tablet Take 20 mg by mouth 2 (two) times daily.   Yes Historical Provider, MD  clindamycin (CLEOCIN T) 1 % external solution Apply 1 application topically 2 (two) times daily. 12/05/14  Yes Historical Provider, MD  Cyanocobalamin (VITAMIN B-12 IJ) Inject 500 mLs as directed every 30 (thirty) days.    Yes Historical Provider, MD  flecainide (TAMBOCOR) 50 MG tablet TAKE 1 TABLET (50 MG TOTAL) BY MOUTH 2 (TWO) TIMES DAILY.   Yes Minna Merritts, MD  fluticasone (FLONASE) 50 MCG/ACT nasal spray Place 1 spray into both nostrils 2 (two) times daily.   Yes Historical  Provider, MD  guaiFENesin (MUCINEX) 600 MG 12 hr tablet Take 600 mg by mouth 2 (two) times daily.    Yes Historical Provider, MD  HYDROcodone-acetaminophen (NORCO/VICODIN) 5-325 MG per tablet Take 1-2 tablets by mouth every 6 (six) hours as needed for moderate pain.   Yes Historical Provider, MD  loratadine (CLARITIN) 10 MG tablet Take 10 mg by mouth daily.   Yes Historical Provider, MD  LORazepam (ATIVAN) 0.5 MG tablet Take 0.5 mg by mouth 2 (two) times daily.   Yes Historical Provider, MD  lovastatin (MEVACOR) 40 MG tablet Take 40 mg by mouth every evening.    Yes Historical Provider, MD  metoprolol tartrate (LOPRESSOR) 25 MG tablet TAKE 1 TABLET (25 MG TOTAL) BY MOUTH 2 (TWO) TIMES DAILY. Patient taking differently: TAKE 0.5 TABLET (25 MG TOTAL) BY MOUTH 2 (TWO) TIMES DAILY. 12/28/14  Yes Minna Merritts, MD  omeprazole (PRILOSEC) 40 MG capsule Take 40 mg by mouth daily before breakfast.    Yes Historical Provider, MD  oxybutynin (DITROPAN) 5 MG tablet Take 5 mg by mouth 3 (three) times daily.  12/07/14  Yes Historical Provider, MD  Polyethylene Glycol 3350 (MIRALAX PO) Take 17 g by mouth every morning.    Yes Historical Provider, MD  QUEtiapine (SEROQUEL) 50 MG tablet Take 50 mg by mouth at bedtime.   Yes Historical Provider, MD  warfarin (COUMADIN) 5 MG tablet Take 2.5-5 mg by mouth every other day. Take 0.5 tab (2.5mg ) orally once a day then alternate with  1 tablet (5mg ) orally once a day.   Yes Historical Provider, MD  dextromethorphan (DELSYM) 30 MG/5ML liquid Take 15 mg by mouth 2 (two) times daily.    Historical Provider, MD  flecainide (TAMBOCOR) 50 MG tablet TAKE 1 TABLET (50 MG TOTAL) BY MOUTH 2 (TWO) TIMES DAILY. 02/22/15   Minna Merritts, MD  ipratropium-albuterol (DUONEB) 0.5-2.5 (3) MG/3ML SOLN Inhale 3 mLs into the lungs daily as needed. 08/13/14   Historical Provider, MD  ketoconazole (NIZORAL) 2 % shampoo Apply 1 application topically once a week. on Fridays.    Historical  Provider, MD  metoprolol tartrate (LOPRESSOR) 25 MG tablet TAKE 1 TABLET (25 MG TOTAL) BY MOUTH 2 (TWO) TIMES DAILY. Patient not taking: Reported on 01/19/2015 01/11/15   Minna Merritts, MD  NON FORMULARY cpap daily at bedtime.    Historical Provider, MD  RENACIDIN irrigation 30 mLs by Bladder Irrigation route 3 (three) times daily. 01/22/15   Historical Provider, MD      VITAL SIGNS:  Blood pressure 118/80, pulse 51, temperature 94.3 F (34.6 C), temperature source Rectal, resp. rate 22, height 5\' 11"  (1.803 m), weight 131.543 kg (290 lb), SpO2 97 %.  PHYSICAL EXAMINATION:   Physical Exam  GENERAL:  49 y.o.-year-old patient lying in the bed with no acute distress. Nonverbal at baseline EYES: Pupils  equal, round, reactive to light and accommodation. No scleral icterus. Extraocular muscles intact.  HEENT: Head atraumatic, normocephalic. Oropharynx and nasopharynx clear. Poor dentition NECK:  Supple, no jugular venous distention. No thyroid enlargement, no tenderness.  LUNGS: Normal breath sounds bilaterally, no wheezing, rales,rhonchi or crepitation. No use of accessory muscles of respiration. Decreased bibasilar breath sounds CARDIOVASCULAR: S1, S2 normal. No murmurs, rubs, or gallops.  ABDOMEN: Soft, nontender, nondistended. Bowel sounds present. No organomegaly or mass. Suprapubic catheter in place EXTREMITIES: No pedal edema, cyanosis, or clubbing.  NEUROLOGIC: Nonverbal at baseline, no new neurological deficits. Does not move his lower extremities, there is spastic. Minimal movement of both upper extremities noted. No facial droop noted. Gait not checked. Patient is wheelchair bound Alert and tracking with his eyes, not following any commands. PSYCHIATRIC: The patient is alert and nonverbal,.  SKIN: No obvious rash, lesion, or ulcer.   LABORATORY PANEL:   CBC  Recent Labs Lab 02/23/15 1701  WBC 6.7  HGB 11.6*  HCT 35.7*  PLT 287    ------------------------------------------------------------------------------------------------------------------  Chemistries   Recent Labs Lab 02/23/15 1558  NA 138  K 3.7  CL 103  CO2 29  GLUCOSE 127*  BUN 11  CREATININE 0.50*  CALCIUM 9.1  AST 32  ALT 31  ALKPHOS 81  BILITOT 0.4   ------------------------------------------------------------------------------------------------------------------  Cardiac Enzymes  Recent Labs Lab 02/23/15 1558  TROPONINI <0.03   ------------------------------------------------------------------------------------------------------------------  RADIOLOGY:  Dg Chest 1 View  02/23/2015   CLINICAL DATA:  Sepsis.  Hypertension.  Initial encounter.  EXAM: CHEST  1 VIEW  COMPARISON:  01/30/2015.  09/15/2014.  12/24/2013.  FINDINGS: Chronically low lung volumes with RIGHT basilar atelectasis or scarring. The RIGHT basilar volume loss is a chronic finding. The cardiopericardial silhouette appears mildly enlarged, with the size accentuated by low volume chest. Mediastinal contours are similar to multiple prior exams. No focal consolidation to suggest pneumonia. There is blunting of the RIGHT costophrenic angle which is probably due to atelectasis rather than effusion.  IMPRESSION: Chronically low lung volumes with RIGHT basilar atelectasis and/ or scarring.   Electronically Signed   By: Dereck Ligas M.D.   On: 02/23/2015 19:01    EKG:   Orders placed or performed during the hospital encounter of 01/19/15  . ED EKG  . ED EKG  . EKG    IMPRESSION AND PLAN:   Kent [name]  is a 49 y.o. male with a known history of chronic progressive neurodegenerative disease with bedbound status at baseline, nonverbal was brought in by his father secondary to decreased appetite and hypothermia.  #1 sepsis-with hyperthermia and metabolic encephalopathy. Likely source UTI again. He is probably colonized with Pseudomonas bacteria -From last my care or  results his Pseudomonas is resistant to gentamicin and carboplatinum's except for). -We'll start on IV Fortaz again -Urine cultures and blood cultures have been ordered. -Chest x-ray has been ordered to rule out any pneumonia. -Do a Electronics engineer for his hypothermia.  #2 metabolic encephalopathy-secondary to his underlying infection. However her family reports that he has been lately declining with more sleepiness and also decreased appetite. Not sure if this is progression of his underlying neurodegenerative condition versus infection.  -he was seen by palliative care team last admission and he is made a DO NOT RESUSCITATE. -He lives at home with his parents taking care of him and also nurse from home health -Overall poor prognosis  #3 hypertension-on metoprolol  #4 atrial fibrillation - heart rate is well controlled. Patient on  metoprolol and flecainide -On Coumadin for anticoagulation.  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 Check INR  All the records are reviewed and case discussed with ED provider. Management plans discussed with the patient, family and they are in agreement.  CODE STATUS: DO NOT RESUSCITATE  TOTAL TIME TAKING CARE OF THIS PATIENT: 50 minutes.    Gladstone Lighter M.D on 02/23/2015 at 7:14 PM  Between 7am to 6pm - Pager - [phone]  After 6pm go to [email or web address] - password [email or web address] [facility]  Office  [phone]  CC: Primary care physician; Ashok Norris, MD

## 2015-02-23 NOTE — Progress Notes (Signed)
ANTICOAGULATION CONSULT NOTE - Initial Consult  Pharmacy Consult for Warfarin Indication: atrial fibrillation  Allergies  Allergen Reactions  . Codeine Nausea Only  . Vancomycin Other (See Comments)    Red mans syndrome   . Zosyn [Piperacillin Sod-Tazobactam So] Rash    Arm erythema on administration, but tolerated cephalosporins fine    Patient Measurements: Height: 5\' 11"  (180.3 cm) Weight: 290 lb (131.543 kg) IBW/kg (Calculated) : 75.3 Heparin Dosing Weight:   Vital Signs: Temp: 94.3 F (34.6 C) (06/14 1751) Temp Source: Rectal (06/14 1751) BP: 131/86 mmHg (06/14 2000) Pulse Rate: 58 (06/14 2000)  Labs:  Recent Labs  02/23/15 1558 02/23/15 1701 02/23/15 1942  HGB  --  11.6*  --   HCT  --  35.7*  --   PLT  --  287  --   LABPROT  --   --  30.9*  INR  --   --  2.96  CREATININE 0.50*  --   --   TROPONINI <0.03  --   --     Estimated Creatinine Clearance: 154.5 mL/min (by C-G formula based on Cr of 0.5).   Medical History: Past Medical History  Diagnosis Date  . Frontotemporal dementia   . Sleep apnea   . Hypertension   . Depression   . Neurogenic bladder   . Myelopathy   . Obesity   . Degeneration, intervertebral disc, cervical   . Elevated liver function tests   . Pulmonary embolus     on chronic anticoagulation  . Chronic UTI (urinary tract infection)   . Pernicious anemia   . Rosacea   . BPH (benign prostatic hypertrophy)   . History of DVT (deep vein thrombosis)   . History of pulmonary embolism     currently on coumadin  . Anxiety   . New onset a-fib 2013    Medications:  Prescriptions prior to admission  Medication Sig Dispense Refill Last Dose  . benzonatate (TESSALON) 100 MG capsule TAKE ONE CAPSULE BY MOUTH EVERY 8 HOURS AS NEEDED 30 capsule 0 Past Month at Unknown time  . cefpodoxime (VANTIN) 200 MG tablet Take 200 mg by mouth 2 (two) times daily. For 10 days     . citalopram (CELEXA) 20 MG tablet Take 20 mg by mouth 2 (two) times  daily.   02/22/2015 at Unknown time  . clindamycin (CLEOCIN T) 1 % external solution Apply 1 application topically 2 (two) times daily.  7 Past Month at Unknown time  . Cyanocobalamin (VITAMIN B-12 IJ) Inject 500 mLs as directed every 30 (thirty) days.    Past Month at Unknown time  . flecainide (TAMBOCOR) 50 MG tablet TAKE 1 TABLET (50 MG TOTAL) BY MOUTH 2 (TWO) TIMES DAILY. 60 tablet 5 02/22/2015 at Unknown time  . fluticasone (FLONASE) 50 MCG/ACT nasal spray Place 1 spray into both nostrils 2 (two) times daily.   02/22/2015 at Unknown time  . guaiFENesin (MUCINEX) 600 MG 12 hr tablet Take 600 mg by mouth 2 (two) times daily.    Past Month at Unknown time  . HYDROcodone-acetaminophen (NORCO/VICODIN) 5-325 MG per tablet Take 1-2 tablets by mouth every 6 (six) hours as needed for moderate pain.   Past Month at Unknown time  . loratadine (CLARITIN) 10 MG tablet Take 10 mg by mouth daily.   Past Month at Unknown time  . LORazepam (ATIVAN) 0.5 MG tablet Take 0.5 mg by mouth 2 (two) times daily.   Past Month at Unknown time  . lovastatin (  MEVACOR) 40 MG tablet Take 40 mg by mouth every evening.    02/22/2015 at Unknown time  . metoprolol tartrate (LOPRESSOR) 25 MG tablet TAKE 1 TABLET (25 MG TOTAL) BY MOUTH 2 (TWO) TIMES DAILY. (Patient taking differently: TAKE 0.5 TABLET (25 MG TOTAL) BY MOUTH 2 (TWO) TIMES DAILY.) 60 tablet 6 02/22/2015 at Unknown time  . omeprazole (PRILOSEC) 40 MG capsule Take 40 mg by mouth daily before breakfast.    02/22/2015 at Unknown time  . oxybutynin (DITROPAN) 5 MG tablet Take 5 mg by mouth 3 (three) times daily.   6 Past Month at Unknown time  . Polyethylene Glycol 3350 (MIRALAX PO) Take 17 g by mouth every morning.    Past Month at Unknown time  . QUEtiapine (SEROQUEL) 50 MG tablet Take 50 mg by mouth at bedtime.   02/22/2015 at Unknown time  . warfarin (COUMADIN) 5 MG tablet Take 2.5-5 mg by mouth every other day. Take 0.5 tab (2.5mg ) orally once a day then alternate with  1  tablet (5mg ) orally once a day.   02/22/2015 at Unknown time  . dextromethorphan (DELSYM) 30 MG/5ML liquid Take 15 mg by mouth 2 (two) times daily.   01/30/2015 at Unknown time  . flecainide (TAMBOCOR) 50 MG tablet TAKE 1 TABLET (50 MG TOTAL) BY MOUTH 2 (TWO) TIMES DAILY. 60 tablet 3   . ipratropium-albuterol (DUONEB) 0.5-2.5 (3) MG/3ML SOLN Inhale 3 mLs into the lungs daily as needed.   01/28/2015  . ketoconazole (NIZORAL) 2 % shampoo Apply 1 application topically once a week. on Fridays.   01/29/2015 at Unknown time  . metoprolol tartrate (LOPRESSOR) 25 MG tablet TAKE 1 TABLET (25 MG TOTAL) BY MOUTH 2 (TWO) TIMES DAILY. (Patient not taking: Reported on 01/19/2015) 60 tablet 3   . NON FORMULARY cpap daily at bedtime.   01/29/2015 at Unknown time  . RENACIDIN irrigation 30 mLs by Bladder Irrigation route 3 (three) times daily.  12 01/30/2015 at Unknown time    Assessment: 6/14 :  INR = 2.96 Pt states he takes 2.5 mg PO every other day, last took dose on 6/13.  Goal of Therapy:  INR 2-3 Monitor platelets by anticoagulation protocol: Yes   Plan:  Will continue this pt on current dose of Warfarin 2.5 mg PO every other day to start 6/15.  Will check INR again on 6/15 with AM labs.   Yaslin Kirtley D 02/23/2015,9:16 PM

## 2015-02-23 NOTE — ED Notes (Signed)
Patient has indwelling suprapubic catheter. Constant UTI. Was on macrobid 100mg  daily and began cefpodoxime last night and has had two doses of that. Was told by home health nurse that culture grew pseudomonas and would need IV antibiotics.

## 2015-02-23 NOTE — ED Provider Notes (Signed)
[facility] Emergency Department Provider Note  Time seen: 4:06 PM  I have reviewed the triage vital signs and the nursing notes.   HISTORY  Chief Complaint Dysuria    HPI Kent [name] is a 49 y.o. male with a past medical history of dementia, hypertension, depression, obesity, PE, DVT, suprapubic catheter with chronic urinary tract infections, who is nonverbal at baseline presents to the emergency department with a urinary tract infection. Patient sees Dr. Jacqlyn Larsen and had a recent urinalysis on 02/19/15 showing a Pseudomonas urinary tract infection. The patient usually takes Macrobid on a daily basis, but this UTI was resistant to Macrobid so the patient was placed on Cefpodoxime yesterday. Home health nurse noted that today the patient was 93.7, which she states is typical when he becomes septic so she sent him to the ER for further evaluation. Upon arrival the patient does have a temperature of 94 here. No acute distress, but the patient is nonverbal at baseline and the father states he would not be able to communicate with [place] if he was in pain.    Past Medical History  Diagnosis Date  . Frontotemporal dementia   . Sleep apnea   . Hypertension   . Depression   . Neurogenic bladder   . Myelopathy   . Obesity   . Degeneration, intervertebral disc, cervical   . Elevated liver function tests   . Pulmonary embolus     on chronic anticoagulation  . Chronic UTI (urinary tract infection)   . Pernicious anemia   . Rosacea   . BPH (benign prostatic hypertrophy)   . History of DVT (deep vein thrombosis)   . History of pulmonary embolism     currently on coumadin  . Anxiety   . New onset a-fib 2013    Patient Active Problem List   Diagnosis Date Noted  . Pressure ulcer of buttock 02/03/2015  . Sepsis 01/30/2015  . Neurogenic bladder 01/30/2015  . Chronic suprapubic catheter 01/30/2015  . Neurodegenerative disorder 01/30/2015  . GERD  (gastroesophageal reflux disease) 01/30/2015  . UTI (lower urinary tract infection) 01/30/2015  . A-fib 08/21/2012  . Dementia 08/21/2012  . Hypertension 08/21/2012  . Hyperlipidemia 08/21/2012  . History of pulmonary embolism 08/21/2012    Past Surgical History  Procedure Laterality Date  . Suprapubic catheter insertion    . Kidney stone surgery      Current Outpatient Rx  Name  Route  Sig  Dispense  Refill  . benzonatate (TESSALON) 100 MG capsule      TAKE ONE CAPSULE BY MOUTH EVERY 8 HOURS AS NEEDED   30 capsule   0   . citalopram (CELEXA) 20 MG tablet   Oral   Take 20 mg by mouth 2 (two) times daily.         . clindamycin (CLEOCIN T) 1 % external solution   Topical   Apply 1 application topically 2 (two) times daily.      7   . Cyanocobalamin (VITAMIN B-12 IJ)   Injection   Inject 500 mLs as directed every 30 (thirty) days.          Marland Kitchen dextromethorphan (DELSYM) 30 MG/5ML liquid   Oral   Take 15 mg by mouth 2 (two) times daily.         . flecainide (TAMBOCOR) 50 MG tablet      TAKE 1 TABLET (50 MG TOTAL) BY MOUTH 2 (TWO) TIMES DAILY.   60 tablet  5   . flecainide (TAMBOCOR) 50 MG tablet      TAKE 1 TABLET (50 MG TOTAL) BY MOUTH 2 (TWO) TIMES DAILY.   60 tablet   3   . fluticasone (FLONASE) 50 MCG/ACT nasal spray   Each Nare   Place 1 spray into both nostrils 2 (two) times daily.         Marland Kitchen guaiFENesin (MUCINEX) 600 MG 12 hr tablet   Oral   Take 600 mg by mouth 2 (two) times daily.          Marland Kitchen ipratropium-albuterol (DUONEB) 0.5-2.5 (3) MG/3ML SOLN   Inhalation   Inhale 3 mLs into the lungs daily as needed.         Marland Kitchen ketoconazole (NIZORAL) 2 % shampoo   Topical   Apply 1 application topically once a week. on Fridays.         Marland Kitchen loratadine (CLARITIN) 10 MG tablet   Oral   Take 10 mg by mouth daily.         Marland Kitchen LORazepam (ATIVAN) 0.5 MG tablet   Oral   Take 0.5 mg by mouth 2 (two) times daily.         Marland Kitchen lovastatin (MEVACOR) 40  MG tablet   Oral   Take 40 mg by mouth every evening.          . metoprolol tartrate (LOPRESSOR) 25 MG tablet      TAKE 1 TABLET (25 MG TOTAL) BY MOUTH 2 (TWO) TIMES DAILY. Patient taking differently: TAKE 0.5 TABLET (25 MG TOTAL) BY MOUTH 2 (TWO) TIMES DAILY.   60 tablet   6   . metoprolol tartrate (LOPRESSOR) 25 MG tablet      TAKE 1 TABLET (25 MG TOTAL) BY MOUTH 2 (TWO) TIMES DAILY. Patient not taking: Reported on 01/19/2015   60 tablet   3   . NON FORMULARY      cpap daily at bedtime.         Marland Kitchen omeprazole (PRILOSEC) 40 MG capsule   Oral   Take 40 mg by mouth daily before breakfast.          . oxybutynin (DITROPAN) 5 MG tablet   Oral   Take 5 mg by mouth 3 (three) times daily.       6   . Polyethylene Glycol 3350 (MIRALAX PO)   Oral   Take 17 g by mouth every morning.          Marland Kitchen QUEtiapine (SEROQUEL) 50 MG tablet   Oral   Take 50 mg by mouth at bedtime.         Marland Kitchen RENACIDIN irrigation   Bladder Irrigation   30 mLs by Bladder Irrigation route 3 (three) times daily.      12     Dispense as written.   . warfarin (COUMADIN) 5 MG tablet   Oral   Take 2.5-5 mg by mouth every other day. Take 0.5 tab (2.5mg ) orally once a day then alternate with  1 tablet (5mg ) orally once a day.           Allergies Codeine and Vancomycin  Family History  Problem Relation Age of Onset  . Hypertension Father   . Hyperlipidemia Father   . Hypertension Mother     Social History History  Substance Use Topics  . Smoking status: Never Smoker   . Smokeless tobacco: Not on file  . Alcohol Use: No    Review of Systems Patient has a  low temperature, otherwise were not able to obtain a full review of systems due to the patient's nonverbal status and dementia.  ____________________________________________   PHYSICAL EXAM:  VITAL SIGNS: ED Triage Vitals  Enc Vitals Group     BP 02/23/15 1510 124/80 mmHg     Pulse Rate 02/23/15 1510 57     Resp 02/23/15  1510 22     Temp 02/23/15 1519 94.7 F (34.8 C)     Temp Source 02/23/15 1519 Rectal     SpO2 02/23/15 1510 100 %     Weight 02/23/15 1510 303 lb (137.44 kg)     Height 02/23/15 1510 5\' 11"  (1.803 m)     Head Cir --      Peak Flow --      Pain Score --      Pain Loc --      Pain Edu? --      Excl. in [place]? --     Constitutional: Alert eyes open, looking around room. Eyes: Normal exam ENT   Mouth/Throat: Mucous membranes are moist. Cardiovascular: Normal rate, regular rhythm. No murmur Respiratory: Normal respiratory effort without tachypnea nor retractions. Breath sounds are clear Gastrointestinal: Soft no reaction to abdominal palpation. No distention.  Suprapubic catheter. Musculoskeletal: Atraumatic extremities. Neurologic:  Nonverbal at baseline, at baseline per father Skin:  Skin is cool, dry. Psychiatric: Unable to fully assess  ____________________________________________    INITIAL IMPRESSION / ASSESSMENT AND PLAN / ED COURSE  Pertinent labs & imaging results that were available during my care of the patient were reviewed by me and considered in my medical decision making (see chart for details).  Patient presents with urinary tract infection seen by Dr. Jacqlyn Larsen. I discussed with the home health nurse as well as with a nurse at Dr. Bjorn Loser office. Urine culture showed pseudomonas, but sensitive to cephalosporins.  Patient does have a low temperature in the emergency department 94.7. We will check labs, treat with IV ceftriaxone, check blood cultures, and decide upon disposition.  Patient remains hypothermic, labs otherwise within normal limits besides a urinary tract infection. We will admit to the hospital for IV antibiotics. ____________________________________________   FINAL CLINICAL IMPRESSION(S) / ED DIAGNOSES  Urinary tract infection Hypothymia   Harvest Dark, MD 02/23/15 2320

## 2015-02-23 NOTE — ED Notes (Signed)
Patient put on bair hugger. Hospitalist is here to see patient and father. Patient attempting to eat and drink with father's help.

## 2015-02-23 NOTE — ED Notes (Signed)
Pt to ED from home with "possible urinary tract infection" Pt has indwelling super pubic cath in place, pt is non verbal per baseline. Vitals wnl at this time.

## 2015-02-24 LAB — BASIC METABOLIC PANEL
Anion gap: 7 (ref 5–15)
BUN: 9 mg/dL (ref 6–20)
CHLORIDE: 106 mmol/L (ref 101–111)
CO2: 27 mmol/L (ref 22–32)
Calcium: 8.8 mg/dL — ABNORMAL LOW (ref 8.9–10.3)
Creatinine, Ser: 0.44 mg/dL — ABNORMAL LOW (ref 0.61–1.24)
GFR calc Af Amer: >60 mL/min (ref >60)
GFR calc non Af Amer: >60 mL/min (ref >60)
GLUCOSE: 81 mg/dL (ref 65–99)
POTASSIUM: 4 mmol/L (ref 3.5–5.1)
SODIUM: 140 mmol/L (ref 135–145)

## 2015-02-24 LAB — CBC
HCT: 35.9 % — ABNORMAL LOW (ref 40.0–52.0)
Hemoglobin: 11.4 g/dL — ABNORMAL LOW (ref 13.0–18.0)
MCH: 28 pg (ref 26.0–34.0)
MCHC: 31.9 g/dL — ABNORMAL LOW (ref 32.0–36.0)
MCV: 87.8 fL (ref 80.0–100.0)
PLATELETS: 287 10*3/uL (ref 150–440)
RBC: 4.09 MIL/uL — AB (ref 4.40–5.90)
RDW: 17.3 % — ABNORMAL HIGH (ref 11.5–14.5)
WBC: 5.5 10*3/uL (ref 3.8–10.6)

## 2015-02-24 LAB — PROTIME-INR
INR: 2.85
PROTHROMBIN TIME: 30 s — AB (ref 11.4–15.0)

## 2015-02-24 MED ORDER — WARFARIN SODIUM 2.5 MG PO TABS
2.5000 mg | ORAL_TABLET | Freq: Once | ORAL | Status: AC
  Administered 2015-02-24: 18:00:00 2.5 mg via ORAL
  Filled 2015-02-24: qty 1

## 2015-02-24 MED ORDER — POLYETHYLENE GLYCOL 3350 17 G PO PACK: 17.0000 g | PACK | Freq: Every day | ORAL | Status: DC | PRN

## 2015-02-24 NOTE — Progress Notes (Signed)
ANTICOAGULATION CONSULT NOTE - Follow Up Consult  Pharmacy Consult for Coumadin Indication: atrial fibrillation and DVT  Allergies  Allergen Reactions  . Codeine Nausea Only  . Vancomycin Other (See Comments)    Red mans syndrome   . Zosyn [Piperacillin Sod-Tazobactam So] Rash    Arm erythema on administration, but tolerated cephalosporins fine    Patient Measurements: Height: 5\' 11"  (180.3 cm) Weight: 292 lb 6.4 oz (132.632 kg) IBW/kg (Calculated) : 75.3 Heparin Dosing Weight: n/a  Vital Signs: Temp: 98.1 F (36.7 C) (06/15 0624) Temp Source: Oral (06/15 0624) BP: 123/70 mmHg (06/15 0624) Pulse Rate: 75 (06/15 0624)  Labs:  Recent Labs  02/23/15 1558 02/23/15 1701 02/23/15 1942 02/24/15 0610  HGB  --  11.6*  --  11.4*  HCT  --  35.7*  --  35.9*  PLT  --  287  --  287  LABPROT  --   --  30.9* 30.0*  INR  --   --  2.96 2.85  CREATININE 0.50*  --   --  0.44*  TROPONINI <0.03  --   --   --     Estimated Creatinine Clearance: 155.1 mL/min (by C-G formula based on Cr of 0.44).   Medications:  Prescriptions prior to admission  Medication Sig Dispense Refill Last Dose  . benzonatate (TESSALON) 100 MG capsule TAKE ONE CAPSULE BY MOUTH EVERY 8 HOURS AS NEEDED 30 capsule 0 Past Month at Unknown time  . cefpodoxime (VANTIN) 200 MG tablet Take 200 mg by mouth 2 (two) times daily. For 10 days     . citalopram (CELEXA) 20 MG tablet Take 20 mg by mouth 2 (two) times daily.   02/22/2015 at Unknown time  . clindamycin (CLEOCIN T) 1 % external solution Apply 1 application topically 2 (two) times daily.  7 Past Month at Unknown time  . Cyanocobalamin (VITAMIN B-12 IJ) Inject 500 mLs as directed every 30 (thirty) days.    Past Month at Unknown time  . flecainide (TAMBOCOR) 50 MG tablet TAKE 1 TABLET (50 MG TOTAL) BY MOUTH 2 (TWO) TIMES DAILY. 60 tablet 5 02/22/2015 at Unknown time  . fluticasone (FLONASE) 50 MCG/ACT nasal spray Place 1 spray into both nostrils 2 (two) times daily.    02/22/2015 at Unknown time  . guaiFENesin (MUCINEX) 600 MG 12 hr tablet Take 600 mg by mouth 2 (two) times daily.    Past Month at Unknown time  . HYDROcodone-acetaminophen (NORCO/VICODIN) 5-325 MG per tablet Take 1-2 tablets by mouth every 6 (six) hours as needed for moderate pain.   Past Month at Unknown time  . loratadine (CLARITIN) 10 MG tablet Take 10 mg by mouth daily.   Past Month at Unknown time  . LORazepam (ATIVAN) 0.5 MG tablet Take 0.5 mg by mouth 2 (two) times daily.   Past Month at Unknown time  . lovastatin (MEVACOR) 40 MG tablet Take 40 mg by mouth every evening.    02/22/2015 at Unknown time  . metoprolol tartrate (LOPRESSOR) 25 MG tablet TAKE 1 TABLET (25 MG TOTAL) BY MOUTH 2 (TWO) TIMES DAILY. (Patient taking differently: TAKE 0.5 TABLET (25 MG TOTAL) BY MOUTH 2 (TWO) TIMES DAILY.) 60 tablet 6 02/22/2015 at Unknown time  . omeprazole (PRILOSEC) 40 MG capsule Take 40 mg by mouth daily before breakfast.    02/22/2015 at Unknown time  . oxybutynin (DITROPAN) 5 MG tablet Take 5 mg by mouth 3 (three) times daily.   6 Past Month at Unknown time  .  Polyethylene Glycol 3350 (MIRALAX PO) Take 17 g by mouth every morning.    Past Month at Unknown time  . QUEtiapine (SEROQUEL) 50 MG tablet Take 50 mg by mouth at bedtime.   02/22/2015 at Unknown time  . warfarin (COUMADIN) 5 MG tablet Take 2.5-5 mg by mouth every other day. Take 0.5 tab (2.5mg ) orally once a day then alternate with  1 tablet (5mg ) orally once a day.   02/22/2015 at Unknown time  . dextromethorphan (DELSYM) 30 MG/5ML liquid Take 15 mg by mouth 2 (two) times daily.   01/30/2015 at Unknown time  . flecainide (TAMBOCOR) 50 MG tablet TAKE 1 TABLET (50 MG TOTAL) BY MOUTH 2 (TWO) TIMES DAILY. 60 tablet 3   . ipratropium-albuterol (DUONEB) 0.5-2.5 (3) MG/3ML SOLN Inhale 3 mLs into the lungs daily as needed.   01/28/2015  . ketoconazole (NIZORAL) 2 % shampoo Apply 1 application topically once a week. on Fridays.   01/29/2015 at Unknown time  .  metoprolol tartrate (LOPRESSOR) 25 MG tablet TAKE 1 TABLET (25 MG TOTAL) BY MOUTH 2 (TWO) TIMES DAILY. (Patient not taking: Reported on 01/19/2015) 60 tablet 3   . NON FORMULARY cpap daily at bedtime.   01/29/2015 at Unknown time  . RENACIDIN irrigation 30 mLs by Bladder Irrigation route 3 (three) times daily.  12 01/30/2015 at Unknown time    Assessment: Patient admitted for UTI. Takes Coumadin for a-fib and history of DVT/PE. Home dose is 2.5mg  alternating with 5mg .  Goal of Therapy:  INR 2-3 Monitor platelets by anticoagulation protocol: Yes   Plan:  Will order Coumadin 2.5mg  for today's dose. Will check daily INR and order Coumadin daily according to INR results. Since patient is on antibiotics and INR is already at the upper end of therapeutic range will need to be careful.  Paulina Fusi, PharmD, BCPS 02/24/2015 9:44 AM

## 2015-02-24 NOTE — Consult Note (Signed)
[facility] Infectious Disease     Reason for Consult:UTI    Referring Physician: Tressia Miners Date of Admission:  02/23/2015   Active Problems:   Cystitis   HPI: Kent [name] is a 49 y.o. 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 Patient has had multiple admissions recently for same complaints. His last admission was 3 weeks ago for acute cystitis secondary to Pseudomonas. He was discharged on intramuscular fortaz and just finished it 10 days ago. In spite of finishing the antibiotics his symptoms were not improving, so he had a repeat urine analysis and cultures done about 4 days ago which were still growing Pseudomonas that is resistant to Macrobid which she is what he is on for maintenance. He follows with urologist Dr. Landry Mellow, he has bladder irrigation being done 3 times a day with Renacidin. According to his father, with each infection he's not reaching his previous baseline and has been sleepy lately with decreased appetite and not interacting much. Currently he is afebrile and seems more alert although family is not at bedside  Past Medical History  Diagnosis Date  . Frontotemporal dementia   . Sleep apnea   . Hypertension   . Depression   . Neurogenic bladder   . Myelopathy   . Obesity   . Degeneration, intervertebral disc, cervical   . Elevated liver function tests   . Pulmonary embolus     on chronic anticoagulation  . Chronic UTI (urinary tract infection)   . Pernicious anemia   . Rosacea   . BPH (benign prostatic hypertrophy)   . History of DVT (deep vein thrombosis)   . History of pulmonary embolism     currently on coumadin  . Anxiety   . New onset a-fib 2013   Past Surgical History  Procedure Laterality Date  . Suprapubic catheter insertion    . Kidney stone surgery     History  Substance Use  Topics  . Smoking status: Never Smoker   . Smokeless tobacco: Not on file  . Alcohol Use: No   Family History  Problem Relation Age of Onset  . Hypertension Father   . Hyperlipidemia Father   . Hypertension Mother     Allergies:  Allergies  Allergen Reactions  . Codeine Nausea Only  . Vancomycin Other (See Comments)    Red mans syndrome   . Zosyn [Piperacillin Sod-Tazobactam So] Rash    Arm erythema on administration, but tolerated cephalosporins fine    Current antibiotics: Antibiotics Given (last 72 hours)    Date/Time Action Medication Dose Rate   02/23/15 2325 Given   cefTAZidime (FORTAZ) 1 g in dextrose 5 % 50 mL IVPB 1 g 100 mL/hr   02/24/15 0527 Given   cefTAZidime (FORTAZ) 1 g in dextrose 5 % 50 mL IVPB 1 g 100 mL/hr   02/24/15 1326 Given   cefTAZidime (FORTAZ) 1 g in dextrose 5 % 50 mL IVPB 1 g 100 mL/hr      MEDICATIONS: . cefTAZidime (FORTAZ)  IV  1 g Intravenous 3 times per day  . citalopram  20 mg Oral BID  . clindamycin  1 application Topical BID  . flecainide  50 mg Oral Q12H  . fluticasone  1 spray Each Nare BID  . gluconic acid-citric acid  30 mL Irrigation TID  . guaiFENesin  600 mg Oral BID  . loratadine  10 mg Oral Daily  . LORazepam  0.5 mg Oral BID  . metoprolol tartrate  25 mg Oral BID  . oxybutynin  5 mg Oral TID  . pantoprazole  40 mg Oral Daily  . pravastatin  40 mg Oral q1800  . QUEtiapine  50 mg Oral QHS  . warfarin  2.5 mg Oral ONCE-1800  . Warfarin - Pharmacist Dosing Inpatient   Does not apply q1800    Review of Systems - 11 systems reviewed and negative per HPI   OBJECTIVE: Temp:  [94.3 F (34.6 C)-98.1 F (36.7 C)] 98.1 F (36.7 C) (06/15 0624) Pulse Rate:  [46-84] 84 (06/15 1004) Resp:  [18-20] 18 (06/15 0624) BP: (105-137)/(70-96) 137/78 mmHg (06/15 1004) SpO2:  [92 %-100 %] 96 % (06/15 0624) Weight:  [129.774 kg (286 lb 1.6 oz)-132.632 kg (292 lb 6.4 oz)] 132.632 kg (292 lb 6.4 oz) (06/15 0624) GENERAL: 49  y.o.-year-old patient lying in the bed with no acute distress. Nonverbal at baseline EYES: Pupils equal, round, reactive to light and accommodation. No scleral icterus. Extraocular muscles intact.  HEENT: Head atraumatic, normocephalic. Oropharynx and nasopharynx clear. Poor dentition NECK: Supple, no jugular venous distention. No thyroid enlargement, no tenderness.  LUNGS: Normal breath sounds bilaterally, no wheezing, rales,rhonchi or crepitation. No use of accessory muscles of respiration. Decreased bibasilar breath sounds CARDIOVASCULAR: S1, S2 normal. No murmurs, rubs, or gallops.  ABDOMEN: Soft, nontender, nondistended. Bowel sounds present. No organomegaly or mass. Suprapubic catheter in place EXTREMITIES: No pedal edema, cyanosis, or clubbing.  NEUROLOGIC: Nonverbal at baseline, no new neurological deficits. Does not move his lower extremities, there is spastic. Minimal movement of both upper extremities noted. No facial droop noted. Gait not checked. Patient is wheelchair bound Alert and tracking with his eyes, not following any commands. PSYCHIATRIC: The patient is alert and nonverbal,.  SKIN: No obvious rash, lesion, or ulcer.   LABS: Results for orders placed or performed during the hospital encounter of 02/23/15 (from the past 48 hour(s))  Blood culture (routine x 2)     Status: None (Preliminary result)   Collection Time: 02/23/15  3:57 PM  Result Value Ref Range   Specimen Description BLOOD    Special Requests BLOOD    Culture NO GROWTH < 24 HOURS    Report Status PENDING   Comprehensive metabolic panel     Status: Abnormal   Collection Time: 02/23/15  3:58 PM  Result Value Ref Range   Sodium 138 135 - 145 mmol/L   Potassium 3.7 3.5 - 5.1 mmol/L   Chloride 103 101 - 111 mmol/L   CO2 29 22 - 32 mmol/L   Glucose, Bld 127 (H) 65 - 99 mg/dL   BUN 11 6 - 20 mg/dL   Creatinine, Ser 0.50 (L) 0.61 - 1.24 mg/dL   Calcium 9.1 8.9 - 10.3 mg/dL   Total Protein 7.0 6.5 - 8.1  g/dL   Albumin 3.3 (L) 3.5 - 5.0 g/dL   AST 32 15 - 41 U/L   ALT 31 17 - 63 U/L   Alkaline Phosphatase 81 38 - 126 U/L   Total Bilirubin 0.4 0.3 - 1.2 mg/dL   GFR calc non Af Amer >60 >60 mL/min   GFR calc Af Amer >60 >60 mL/min    Comment: (NOTE) The eGFR has been calculated using the CKD EPI equation. This calculation has not been validated in all clinical situations. eGFR's persistently <60 mL/min signify possible Chronic Kidney Disease.    Anion gap 6  5 - 15  Urinalysis complete, with microscopic ([facility] only)     Status: Abnormal   Collection Time: 02/23/15  3:58 PM  Result Value Ref Range   Color, Urine AMBER (A) YELLOW   APPearance CLOUDY (A) CLEAR   Glucose, UA NEGATIVE NEGATIVE mg/dL   Bilirubin Urine NEGATIVE NEGATIVE   Ketones, ur NEGATIVE NEGATIVE mg/dL   Specific Gravity, Urine 1.026 1.005 - 1.030   Hgb urine dipstick NEGATIVE NEGATIVE   pH 5.0 5.0 - 8.0   Protein, ur 100 (A) NEGATIVE mg/dL   Nitrite NEGATIVE NEGATIVE   Leukocytes, UA 3+ (A) NEGATIVE   RBC / HPF 6-30 0 - 5 RBC/hpf   WBC, UA TOO NUMEROUS TO COUNT 0 - 5 WBC/hpf   Bacteria, UA RARE (A) NONE SEEN   Squamous Epithelial / LPF 0-5 (A) NONE SEEN   Mucous PRESENT    Budding Yeast PRESENT    Hyaline Casts, UA PRESENT    Ca Oxalate Crys, UA PRESENT   Urine culture     Status: None (Preliminary result)   Collection Time: 02/23/15  3:58 PM  Result Value Ref Range   Specimen Description URINE, CLEAN CATCH    Special Requests NONE    Culture TOO YOUNG TO READ    Report Status PENDING   Troponin I     Status: None   Collection Time: 02/23/15  3:58 PM  Result Value Ref Range   Troponin I <0.03 <0.031 ng/mL    Comment:        NO INDICATION OF MYOCARDIAL INJURY.   Blood culture (routine x 2)     Status: None (Preliminary result)   Collection Time: 02/23/15  4:10 PM  Result Value Ref Range   Specimen Description BLOOD    Special Requests BLOOD    Culture NO GROWTH < 24 HOURS    Report Status PENDING    CBC     Status: Abnormal   Collection Time: 02/23/15  5:01 PM  Result Value Ref Range   WBC 6.7 3.8 - 10.6 K/uL   RBC 4.09 (L) 4.40 - 5.90 MIL/uL   Hemoglobin 11.6 (L) 13.0 - 18.0 g/dL   HCT 35.7 (L) 40.0 - 52.0 %   MCV 87.2 80.0 - 100.0 fL   MCH 28.2 26.0 - 34.0 pg   MCHC 32.4 32.0 - 36.0 g/dL   RDW 17.4 (H) 11.5 - 14.5 %   Platelets 287 150 - 440 K/uL  Protime-INR     Status: Abnormal   Collection Time: 02/23/15  7:42 PM  Result Value Ref Range   Prothrombin Time 30.9 (H) 11.4 - 15.0 seconds   INR 2.96   Protime-INR     Status: Abnormal   Collection Time: 02/24/15  6:10 AM  Result Value Ref Range   Prothrombin Time 30.0 (H) 11.4 - 15.0 seconds   INR 2.35   Basic metabolic panel     Status: Abnormal   Collection Time: 02/24/15  6:10 AM  Result Value Ref Range   Sodium 140 135 - 145 mmol/L   Potassium 4.0 3.5 - 5.1 mmol/L   Chloride 106 101 - 111 mmol/L   CO2 27 22 - 32 mmol/L   Glucose, Bld 81 65 - 99 mg/dL   BUN 9 6 - 20 mg/dL   Creatinine, Ser 0.44 (L) 0.61 - 1.24 mg/dL   Calcium 8.8 (L) 8.9 - 10.3 mg/dL   GFR calc non Af Amer >60 >60 mL/min   GFR calc Af Amer >  60 >60 mL/min    Comment: (NOTE) The eGFR has been calculated using the CKD EPI equation. This calculation has not been validated in all clinical situations. eGFR's persistently <60 mL/min signify possible Chronic Kidney Disease.    Anion gap 7 5 - 15  CBC     Status: Abnormal   Collection Time: 02/24/15  6:10 AM  Result Value Ref Range   WBC 5.5 3.8 - 10.6 K/uL   RBC 4.09 (L) 4.40 - 5.90 MIL/uL   Hemoglobin 11.4 (L) 13.0 - 18.0 g/dL   HCT 35.9 (L) 40.0 - 52.0 %   MCV 87.8 80.0 - 100.0 fL   MCH 28.0 26.0 - 34.0 pg   MCHC 31.9 (L) 32.0 - 36.0 g/dL   RDW 17.3 (H) 11.5 - 14.5 %   Platelets 287 150 - 440 K/uL   No components found for: ESR, C REACTIVE PROTEIN MICRO: Recent Results (from the past 720 hour(s))  Blood Culture (routine x 2)     Status: None   Collection Time: 01/30/15  4:01 PM  Result  Value Ref Range Status   Specimen Description BLOOD  Final   Special Requests NONE  Final   Culture NO GROWTH 5 DAYS  Final   Report Status 02/04/2015 FINAL  Final  Blood Culture (routine x 2)     Status: None   Collection Time: 01/30/15  4:03 PM  Result Value Ref Range Status   Specimen Description BLOOD  Final   Special Requests NONE  Final   Culture NO GROWTH 5 DAYS  Final   Report Status 02/04/2015 FINAL  Final  Urine culture     Status: None   Collection Time: 01/30/15  4:31 PM  Result Value Ref Range Status   Specimen Description URINE, RANDOM  Final   Special Requests NONE  Final   Culture >=100,000 COLONIES/mL PSEUDOMONAS AERUGINOSA  Final   Report Status 02/03/2015 FINAL  Final   Organism ID, Bacteria PSEUDOMONAS AERUGINOSA  Final      Susceptibility   Pseudomonas aeruginosa - MIC*    CEFTAZIDIME <=1 SENSITIVE Sensitive     CIPROFLOXACIN >=4 RESISTANT Resistant     GENTAMICIN 4 SENSITIVE Sensitive     IMIPENEM 2 SENSITIVE Sensitive     * >=100,000 COLONIES/mL PSEUDOMONAS AERUGINOSA  Blood culture (routine x 2)     Status: None (Preliminary result)   Collection Time: 02/23/15  3:57 PM  Result Value Ref Range Status   Specimen Description BLOOD  Final   Special Requests BLOOD  Final   Culture NO GROWTH < 24 HOURS  Final   Report Status PENDING  Incomplete  Urine culture     Status: None (Preliminary result)   Collection Time: 02/23/15  3:58 PM  Result Value Ref Range Status   Specimen Description URINE, CLEAN CATCH  Final   Special Requests NONE  Final   Culture TOO YOUNG TO READ  Final   Report Status PENDING  Incomplete  Blood culture (routine x 2)     Status: None (Preliminary result)   Collection Time: 02/23/15  4:10 PM  Result Value Ref Range Status   Specimen Description BLOOD  Final   Special Requests BLOOD  Final   Culture NO GROWTH < 24 HOURS  Final   Report Status PENDING  Incomplete    IMAGING: Dg Chest 1 View  02/23/2015   CLINICAL DATA:  Sepsis.   Hypertension.  Initial encounter.  EXAM: CHEST  1 VIEW  COMPARISON:  01/30/2015.  09/15/2014.  12/24/2013.  FINDINGS: Chronically low lung volumes with RIGHT basilar atelectasis or scarring. The RIGHT basilar volume loss is a chronic finding. The cardiopericardial silhouette appears mildly enlarged, with the size accentuated by low volume chest. Mediastinal contours are similar to multiple prior exams. No focal consolidation to suggest pneumonia. There is blunting of the RIGHT costophrenic angle which is probably due to atelectasis rather than effusion.  IMPRESSION: Chronically low lung volumes with RIGHT basilar atelectasis and/ or scarring.   Electronically Signed   By: Dereck Ligas M.D.   On: 02/23/2015 19:01   Dg Abd 1 View  01/30/2015   CLINICAL DATA:  Altered mental status  EXAM: ABDOMEN - 1 VIEW  COMPARISON:  Abdominal radiographs dated 12/12/2014  FINDINGS: Nonobstructive bowel gas pattern.  Mild gaseous distention of sigmoid colon in the mid abdomen. This is improved from prior radiographs and likely reflects gaseous distention of nondependent colon when correlating with prior CT.  Degenerative changes of the bilateral hips.  IMPRESSION: No evidence of bowel obstruction.  Mild gaseous distention of nondependent sigmoid colon.   Electronically Signed   By: Julian Hy M.D.   On: 01/30/2015 21:48   Ct Head Wo Contrast  01/30/2015   CLINICAL DATA:  Acute onset of altered mental status. Loss of appetite. Initial encounter.  EXAM: CT HEAD WITHOUT CONTRAST  TECHNIQUE: Contiguous axial images were obtained from the base of the skull through the vertex without intravenous contrast.  COMPARISON:  CT of the head performed 12/08/2014  FINDINGS: There is no evidence of acute infarction, mass lesion, or intra- or extra-axial hemorrhage on CT.  Prominence of the ventricles and sulci reflects moderate cortical volume loss. Mild cerebellar atrophy is noted. Scattered periventricular and subcortical white  matter change likely reflects small vessel ischemic microangiopathy. A small chronic lacunar infarct is noted within the right basal ganglia, and a small chronic lacunar infarct is noted at the left cerebellar hemisphere.  The brainstem and fourth ventricle are within normal limits. The cerebral hemispheres demonstrate grossly normal gray-white differentiation. No mass effect or midline shift is seen.  There is no evidence of fracture; visualized osseous structures are unremarkable in appearance. The orbits are within normal limits. The paranasal sinuses and mastoid air cells are well-aerated. No significant soft tissue abnormalities are seen.  IMPRESSION: 1. No acute intracranial pathology seen on CT. 2. Moderate cortical volume loss and scattered small vessel ischemic microangiopathy. 3. 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   Electronically Signed   By: Garald Balding M.D.   On: 01/30/2015 18:03   Dg Chest Port 1 View  01/30/2015   CLINICAL DATA:  Altered mental status. Cough, congestion for 5 days.  EXAM: PORTABLE CHEST - 1 VIEW  COMPARISON:  01/19/2015  FINDINGS: Low lung volumes. Right basilar atelectasis is similar to prior study. Minimal left base atelectasis. Mild cardiomegaly. No visible effusions or acute bony abnormality.  IMPRESSION: Low lung volumes, bibasilar atelectasis.   Electronically Signed   By: Rolm Baptise M.D.   On: 01/30/2015 15:59    Assessment:   Kent [name] is a 48 y.o. male known history of chronic progressive neurodegenerative disease with bedbound status at baseline, nonverbal, has suprapubic cath with repeat admission for change in mental status. He has had pseudomonas noted on UCX on recent admission and had repeat ucx done 6/10 after change of cath at urology office. The culture remains > 100 k despite having  been treated with IM ceftazidime from last admission.  UA with TNTC WBC. He did not have a  peripheral leukocytosis  Initially was hypothermic but seems improved with IV ceftazidime. Unclear if he is back to his baseline mentally.   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  REC At this point would likely benefit from placement of PICC line and 21 day course of IV ceftazidime 1 gm q 8 hours (Pseudomonas can be very difficult to eradicate). Would change his suprapubic cath once he has demonstrated improvement to try to decrease the biofilm. There is no oral suppressive therapy for prevention of recurrence.  Could consider use of urogesic blue or methinamine if Dr Jacqlyn Larsen feels it may help. Can also do intermittent IM gent dosing to prevent recurrence but the organism is already MDR and may run out of options if recurrent infection persists.  Thank you very much for allowing me to participate in the care of this patient. Please call with questions  .   Cheral Marker. Ola Spurr, MD

## 2015-02-24 NOTE — Plan of Care (Signed)
Problem: Discharge Progression Outcomes Goal: Discharge plan in place and appropriate Individualization:  Outcome: Progressing Individualization: Pt prefers to be called Kent [name] who lives at home with his parents. Hx HTN, chronic UTI, frontal lobe dementia, anxiety, new-onset a fib controlled by home medications Pt is bedbound and nonverbal at baseline needing total care to meet pt needs.   Goal: Other Discharge Outcomes/Goals Outcome: Progressing Plan of Care Progress to Goal: Pt is much more alert today according to home health aid.  Also has had good appetite today.  Afebrile w/heating blanket.  On abx.  Had Infectious disease consult.  Dr thinks he wasn't on a long enough coarse of abx on last UTI w/pseudomnas - also getting more resistant. No s/sx of pain or distress.  Had large BM today.  Buttocks excoriated - looks like moisture related injury.

## 2015-02-24 NOTE — Plan of Care (Signed)
Problem: Discharge Progression Outcomes Goal: Discharge plan in place and appropriate Individualization: Pt prefers to be called Kent [name] who lives at home with his parents. Hx HTN, chronic UTI, frontal lobe dementia, anxiety, new-onset a fib controlled by home medications Pt is bedbound and nonverbal at baseline needing total care to meet pt needs.                  Goal: Other Discharge Outcomes/Goals Plan of care progress to goals: 1. No evidence of pain/discomfort. Pt resting well. 2. Hemodynamically:              -VSS, Temperature 95.2, heating blanket applied and remains in place             -suprapubic catheter intact on admission with good output             -IVF infusing, Abx given as scheduled  3. Pt bedbound at baseline. Q2h repositioning, heels floating, foam applied to sacrum for prevention 4. Complication: bilateral buttocks and sacral area very red/excoriated. Cleansed and foam applied.  Will continue to assess.

## 2015-02-24 NOTE — Progress Notes (Signed)
PT Cancellation Note  Patient Details Name: Kent [name] MRN: [id number] DOB: 06/05/1966   Cancelled Treatment:    Reason Eval/Treat Not Completed: PT screened, no needs identified, will sign off. Chart reviewed. RN and care management consulted. Spoke with private caregiver in room. Pt is dependent care at baseline for all transfers and mobility. Pt does not perform bed mobility, transfers, or ambulation and lift is used for all transfers. Caregiver performs PROM at home and feels confident continuing. Pt has no needs at this time. Will complete orders. Please enter new orders if status or needs change.  Lyndel Safe Teigen Parslow PT, DPT   Peta Peachey 02/24/2015, 1:11 PM

## 2015-02-24 NOTE — Progress Notes (Signed)
[facility] [place]    MR#:  [id number]  DATE OF BIRTH:  Oct 17, 1965  SUBJECTIVE:  Non verbal/commucnicative due to neurodegenerative disorder. Father in the room. No fever. Came in with hypothermia. Eating well. Bed bound. Total care  REVIEW OF SYSTEMS:   Review of Systems  Unable to perform ROS: mental acuity   Tolerating Diet:yes Tolerating PT: no Bedbound  DRUG ALLERGIES:   Allergies  Allergen Reactions  . Codeine Nausea Only  . Vancomycin Other (See Comments)    Red mans syndrome   . Zosyn [Piperacillin Sod-Tazobactam So] Rash    Arm erythema on administration, but tolerated cephalosporins fine    VITALS:  Blood pressure 143/84, pulse 69, temperature 98.8 F (37.1 C), temperature source Oral, resp. rate 18, height 5\' 11"  (1.803 m), weight 132.632 kg (292 lb 6.4 oz), SpO2 93 %.  PHYSICAL EXAMINATION:   Physical Exam  GENERAL:  49 y.o.-year-old patient lying in the bed with no acute distress. Morbidly obese EYES: Pupils equal, round, reactive to light and accommodation. No scleral icterus. Extraocular muscles intact.  HEENT: Head atraumatic, normocephalic. Oropharynx and nasopharynx clear.  NECK:  Supple, no jugular venous distention. No thyroid enlargement, no tenderness.  LUNGS: Normal breath sounds bilaterally, no wheezing, rales, rhonchi. No use of accessory muscles of respiration.  CARDIOVASCULAR: S1, S2 normal. No murmurs, rubs, or gallops.  ABDOMEN: Soft, nontender, nondistended. Bowel sounds present. No organomegaly or mass. suprapubic catheter+ EXTREMITIES: No cyanosis, clubbing or edema b/l.    NEUROLOGIC: Cranial nerves II through XII are intact. No focal Motor or sensory deficits b/l.   PSYCHIATRIC: The patient is alert , non verbal SKIN warm and dry   LABORATORY PANEL:   CBC  Recent Labs Lab 02/24/15 0610  WBC 5.5  HGB 11.4*  HCT 35.9*  PLT 287    Chemistries   Recent  Labs Lab 02/23/15 1558 02/24/15 0610  NA 138 140  K 3.7 4.0  CL 103 106  CO2 29 27  GLUCOSE 127* 81  BUN 11 9  CREATININE 0.50* 0.44*  CALCIUM 9.1 8.8*  AST 32  --   ALT 31  --   ALKPHOS 81  --   BILITOT 0.4  --     Cardiac Enzymes  Recent Labs Lab 02/23/15 1558  TROPONINI <0.03    RADIOLOGY:  Dg Chest 1 View  02/23/2015   CLINICAL DATA:  Sepsis.  Hypertension.  Initial encounter.  EXAM: CHEST  1 VIEW  COMPARISON:  01/30/2015.  09/15/2014.  12/24/2013.  FINDINGS: Chronically low lung volumes with RIGHT basilar atelectasis or scarring. The RIGHT basilar volume loss is a chronic finding. The cardiopericardial silhouette appears mildly enlarged, with the size accentuated by low volume chest. Mediastinal contours are similar to multiple prior exams. No focal consolidation to suggest pneumonia. There is blunting of the RIGHT costophrenic angle which is probably due to atelectasis rather than effusion.  IMPRESSION: Chronically low lung volumes with RIGHT basilar atelectasis and/ or scarring.   Electronically Signed   By: Dereck Ligas M.D.   On: 02/23/2015 19:01     ASSESSMENT AND PLAN:   49 y.o. male with a known history of chronic progressive neurodegenerative disease with bedbound status at baseline, nonverbal was brought in by his father secondary to decreased appetite and hypothermia.  #1 sepsis-with hypothermia and metabolic encephalopathy. Likely source UTI again. He is probably colonized with Pseudomonas bacteria -From lUC Pseudomonas is resistant to  gentamicin and carboplatinum's except for fortaz. -We'll start on IV Fortaz again -Urine cultures and blood cultures have been ordered. -Chest x-ray has been ordered to rule out any pneumonia. -ID consult for recurrent urinany infaction  #2 metabolic encephalopathy-secondary to his underlying infection.  -Not sure if this is progression of his underlying neurodegenerative condition versus infection.  -he was seen by  palliative care team last admission andpt is a DO NOT RESUSCITATE. -He lives at home with his parents taking care of him and also nurse from home health -Overall poor prognosis  #3 hypertension-on metoprolol  #4 atrial fibrillation - heart rate is well controlled. Patient on metoprolol and flecainide -On Coumadin for anticoagulation.  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 Check INR  Management plans discussed with the family and they are in agreement.  CODE STATUS: DNR  DVT Prophylaxis: coumadin  TOTAL TIME TAKING CARE OF THIS PATIENT:25 minutes.  >50% time spent on counselling and coordination of care  POSSIBLE D/C IN 1-2DAYS, DEPENDING ON CLINICAL CONDITION.   Melita Villalona M.D on 02/24/2015 at 2:10 PM  Between 7am to 6pm - Pager - [phone]  After 6pm go to [email or web address] - password [email or web address] [facility]  Office  [phone]  CC: Primary care physician; Ashok Norris, MD

## 2015-02-25 ENCOUNTER — Inpatient Hospital Stay: Payer: Medicare Other

## 2015-02-25 LAB — PROTIME-INR
INR: 1.6
PROTHROMBIN TIME: 19.2 s — AB (ref 11.4–15.0)

## 2015-02-25 MED ORDER — WARFARIN SODIUM 5 MG PO TABS
5.0000 mg | ORAL_TABLET | Freq: Once | ORAL | Status: AC
  Administered 2015-02-25: 5 mg via ORAL
  Filled 2015-02-25: qty 1

## 2015-02-25 NOTE — Progress Notes (Signed)
Initial Nutrition Assessment  INTERVENTION: Meals and Snacks: Cater to patient preferences Coordination of Care: Care taker reported that thickener was being used with liquids PTA; spoke with MD Tressia Miners who reports will speak with SLP. RD notes, MD changed diet order to Nectar thick liquids.    NUTRITION DIAGNOSIS:  Swallowing difficulty related to dysphagia as evidenced by per patient/family report.  GOAL:  Patient will meet greater than or equal to 90% of their needs  MONITOR:   (Energy Intake, Digestive system, Electrolyte and Renal Profile)  REASON FOR ASSESSMENT:   (RD Screen, Thickened Liquids)    ASSESSMENT:  Pt admitted with cystitis secondary to UTI. Pt nonverbal, bedbound secondary to neurodegenerative disorder.  PMhx:  Past Medical History  Diagnosis Date  . Frontotemporal dementia   . Sleep apnea   . Hypertension   . Depression   . Neurogenic bladder   . Myelopathy   . Obesity   . Degeneration, intervertebral disc, cervical   . Elevated liver function tests   . Pulmonary embolus     on chronic anticoagulation  . Chronic UTI (urinary tract infection)   . Pernicious anemia   . Rosacea   . BPH (benign prostatic hypertrophy)   . History of DVT (deep vein thrombosis)   . History of pulmonary embolism     currently on coumadin  . Anxiety   . New onset a-fib 2013   Current Nutrition: care taker reports not being there this am but likely pt ate well. Per MD note, improved appetite. Recorded po intake 100% of lunch yesterday.  Food/Nutrition-Related History: pt care taker and mother reports pt has a very good appetite PTA.  Usually on pureed foods and reports that they thicken liquids with thickener by hand. No supplements given as pt eats well.  Digestive System: Last [facility] 6/16 Intake/Output Summary (Last 24 hours) at 02/25/15 1244 Last data filed at 02/25/15 1027  Gross per 24 hour  Intake      0 ml  Output   3475 ml  Net  -3475 ml      Electrolyte/Renal Profile and Glucose Profile:  Recent Labs Lab 02/23/15 1558 02/24/15 0610  NA 138 140  K 3.7 4.0  CL 103 106  CO2 29 27  BUN 11 9  CREATININE 0.50* 0.44*  CALCIUM 9.1 8.8*  GLUCOSE 127* 81   Protein Profile:  Recent Labs Lab 02/23/15 1558  ALBUMIN 3.3*   Medications: Protonix, Coumadin, NS at 174mL/hr  Anthropometrics: RD notes per [facility] encounters weight gain  Height:  Ht Readings from Last 1 Encounters:  02/23/15 5\' 11"  (1.803 m)    Weight:  Wt Readings from Last 1 Encounters:  02/24/15 292 lb 6.4 oz (132.632 kg)    Wt Readings from Last 10 Encounters:  02/24/15 292 lb 6.4 oz (132.632 kg)  01/31/15 287 lb (130.182 kg)  01/19/15 290 lb (131.543 kg)  08/18/14 280 lb (127.007 kg)  10/16/13 285 lb (129.275 kg)    BMI:  Body mass index is 40.8 kg/(m^2).    Unable to complete Nutrition-Focused physical exam at this time.   Estimated Nutritional Needs:  Kcal:  BEE: 1662kcals, TEE: (IF 1.0-1.2)(AF 1.2) 1994-2393kcals,using IBW of 78kg  Protein:  78-94g protein (1.0-1.2g/kg) using IBW of 78kg  Fluid:  1950-2326mL of fluid (25-58mL/kg) using IBW of 78kg  Skin:   reviewed, no pressure ulcers per Nsg, last admission pt had stage II pressure ulcer of rectum 5/23  Diet Order:  DIET - DYS 1 Room service  appropriate?: Yes; Fluid consistency:: Nectar Thick  EDUCATION NEEDS:  Education needs no appropriate at this time   [place], [place], LDN Pager [phone]

## 2015-02-25 NOTE — Plan of Care (Signed)
Problem: Discharge Progression Outcomes Goal: Other Discharge Outcomes/Goals Outcome: Progressing Plan of care progress to goal for: Pain-no c/o pain this shift Hemodynamically-VSS Complications-no evidence of this shift Diet-pt tolerating diet at this time Activity-pt is on bedrest

## 2015-02-25 NOTE — Care Management (Addendum)
Kent [name] lives at home with his parents. He is basically bed bound. Followed by [facility] for Nursing and therapy. Will need PICC line placed and antibiotics in the home per Dr. Tressia Miners.  Informed by Nurse Stanton Kidney, that the family requested a new mattress for the hospital bed. Telephone call to Will, [facility] representative.  Kefzol 1 gram IV every 8 hours x 18 days faxed to [facility] and prescription for mattress given to Will, representative for [facility]. Shelbie Ammons RN MSN Care Management [phone]

## 2015-02-25 NOTE — Plan of Care (Signed)
Problem: Discharge Progression Outcomes Goal: Other Discharge Outcomes/Goals Outcome: Progressing Plan of Care Progress to Goal:  Pt had PICC line placed today for IV abx at home.  New mattress was ordered through [facility].  Pt has chronic hypothermia possibly r/t infection or neurodegenerative condition. Recommended family get an electric blanket or sheet to keep temp stable.  Suprapubic cath changed 6/10 at urologist office. Possible d/c home tomorrow if pt is stable.

## 2015-02-25 NOTE — Progress Notes (Signed)
[facility] [place]    MR#:  [id number]  DATE OF BIRTH:  1966-07-14  SUBJECTIVE:  - Non verbal/commucnicative due to neurodegenerative disorder. Father in the room.  - Temperature improved, was hypothermic on admission. But remains on [facility]. -Improved alertness and also appetite according to father.  REVIEW OF SYSTEMS:   Review of Systems  Unable to perform ROS: mental acuity   Tolerating Diet:yes Tolerating PT: no Bedbound  DRUG ALLERGIES:   Allergies  Allergen Reactions  . Codeine Nausea Only  . Vancomycin Other (See Comments)    Red mans syndrome   . Zosyn [Piperacillin Sod-Tazobactam So] Rash    Arm erythema on administration, but tolerated cephalosporins fine    VITALS:  Blood pressure 129/75, pulse 64, temperature 98.7 F (37.1 C), temperature source Oral, resp. rate 18, height 5\' 11"  (1.803 m), weight 132.632 kg (292 lb 6.4 oz), SpO2 97 %.  PHYSICAL EXAMINATION:   Physical Exam  GENERAL:  49 y.o.-year-old patient lying in the bed with no acute distress. Morbidly obese EYES: Pupils equal, round, reactive to light and accommodation. No scleral icterus. Extraocular muscles intact.  HEENT: Head atraumatic, normocephalic. Oropharynx and nasopharynx clear.  NECK:  Supple, no jugular venous distention. No thyroid enlargement, no tenderness.  LUNGS: Normal breath sounds bilaterally, no wheezing, rales, rhonchi. No use of accessory muscles of respiration.  CARDIOVASCULAR: S1, S2 normal. No murmurs, rubs, or gallops.  ABDOMEN: Soft, nontender, nondistended. Bowel sounds present. No organomegaly or mass. suprapubic catheter+ EXTREMITIES: No cyanosis, clubbing or edema b/l.    NEUROLOGIC: Cranial nerves II through XII are intact. No focal Motor or sensory deficits b/l.   PSYCHIATRIC: The patient is alert , non verbal, tracking but not following commands which is his baseline. SKIN warm and  dry   LABORATORY PANEL:   CBC  Recent Labs Lab 02/24/15 0610  WBC 5.5  HGB 11.4*  HCT 35.9*  PLT 287    Chemistries   Recent Labs Lab 02/23/15 1558 02/24/15 0610  NA 138 140  K 3.7 4.0  CL 103 106  CO2 29 27  GLUCOSE 127* 81  BUN 11 9  CREATININE 0.50* 0.44*  CALCIUM 9.1 8.8*  AST 32  --   ALT 31  --   ALKPHOS 81  --   BILITOT 0.4  --     Cardiac Enzymes  Recent Labs Lab 02/23/15 1558  TROPONINI <0.03    RADIOLOGY:  Dg Chest 1 View  02/23/2015   CLINICAL DATA:  Sepsis.  Hypertension.  Initial encounter.  EXAM: CHEST  1 VIEW  COMPARISON:  01/30/2015.  09/15/2014.  12/24/2013.  FINDINGS: Chronically low lung volumes with RIGHT basilar atelectasis or scarring. The RIGHT basilar volume loss is a chronic finding. The cardiopericardial silhouette appears mildly enlarged, with the size accentuated by low volume chest. Mediastinal contours are similar to multiple prior exams. No focal consolidation to suggest pneumonia. There is blunting of the RIGHT costophrenic angle which is probably due to atelectasis rather than effusion.  IMPRESSION: Chronically low lung volumes with RIGHT basilar atelectasis and/ or scarring.   Electronically Signed   By: Dereck Ligas M.D.   On: 02/23/2015 19:01     ASSESSMENT AND PLAN:   49 y.o. male with a known history of chronic progressive neurodegenerative disease with bedbound status at baseline, nonverbal was brought in by his father secondary to decreased appetite and hypothermia.  #1 sepsis-with hypothermia and  metabolic encephalopathy. - Likely source UTI again. Due to Pseudomonas bacteria -Multiple cultures growing Pseudomonas, likely going to be multidrug resistant. Cultures from this admission are pending. -His suprapubic catheter was just changed on 02/19/2015 at urologist office. -Appreciate infectious disease consult. Treatment of Pseudomonas for him per ID, needs to be 3 weeks of IV Tressie Ellis. -We'll check with pharmacy to  adjust the dose to twice a day dose. -10 days into treatment with IV antibiotics may be suprapubic catheter needs to be changed again. -PICC line has been ordered for today -Urine cultures and blood cultures have been ordered. -Chest x-ray negative for any pneumonia.  #2 metabolic encephalopathy-secondary to his underlying infection.  -Not sure if this is progression of his underlying neurodegenerative condition versus infection.  -he was seen by palliative care team last admission andpt is a DO NOT RESUSCITATE. -He lives at home with his parents taking care of him and also nurse from home health -Overall poor prognosis  #3 hypertension-on metoprolol  #4 atrial fibrillation - heart rate is well controlled.  -Patient on metoprolol and flecainide -On Coumadin for anticoagulation.  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 -INR is subtherapeutic at 1.6 today. Continue current dose of Coumadin. Not sure of the drop happened from 2.8-1.6. Might need to repeat labs. -Pharmacy increased his Coumadin from 2.5-5 mg.  Management plans discussed with the family and they are in agreement.  CODE STATUS: DNR  DVT Prophylaxis: coumadin  TOTAL TIME TAKING CARE OF THIS PATIENT:25 minutes.  >50% time spent on counselling and coordination of care  Discussed with father at bedside, and also with Dr. Ola Spurr.  POSSIBLE D/C tomorrow, DEPENDING ON CLINICAL CONDITION. She will be discharged home with home health.   Gladstone Lighter M.D on 02/25/2015 at 2:10 PM  Between 7am to 6pm - Pager - [phone]  After 6pm go to [email or web address] - password [email or web address] [facility]  Office  [phone]  CC: Primary care physician; Ashok Norris, MD

## 2015-02-25 NOTE — Progress Notes (Signed)
ANTICOAGULATION CONSULT NOTE - Follow Up Consult  Pharmacy Consult for Coumadin Indication: atrial fibrillation and DVT  Allergies  Allergen Reactions  . Codeine Nausea Only  . Vancomycin Other (See Comments)    Red mans syndrome   . Zosyn [Piperacillin Sod-Tazobactam So] Rash    Arm erythema on administration, but tolerated cephalosporins fine    Patient Measurements: Height: 5\' 11"  (180.3 cm) Weight: 292 lb 6.4 oz (132.632 kg) IBW/kg (Calculated) : 75.3 Heparin Dosing Weight: n/a  Vital Signs: Temp: 98.7 F (37.1 C) (06/16 0455) Temp Source: Oral (06/16 0455) BP: 129/75 mmHg (06/16 1003) Pulse Rate: 64 (06/16 1003)  Labs:  Recent Labs  02/23/15 1558 02/23/15 1701 02/23/15 1942 02/24/15 0610 02/25/15 0534  HGB  --  11.6*  --  11.4*  --   HCT  --  35.7*  --  35.9*  --   PLT  --  287  --  287  --   LABPROT  --   --  30.9* 30.0* 19.2*  INR  --   --  2.96 2.85 1.60  CREATININE 0.50*  --   --  0.44*  --   TROPONINI <0.03  --   --   --   --     Estimated Creatinine Clearance: 155.1 mL/min (by C-G formula based on Cr of 0.44).   Medications:  Prescriptions prior to admission  Medication Sig Dispense Refill Last Dose  . benzonatate (TESSALON) 100 MG capsule TAKE ONE CAPSULE BY MOUTH EVERY 8 HOURS AS NEEDED 30 capsule 0 Past Month at Unknown time  . cefpodoxime (VANTIN) 200 MG tablet Take 200 mg by mouth 2 (two) times daily. For 10 days     . citalopram (CELEXA) 20 MG tablet Take 20 mg by mouth 2 (two) times daily.   02/22/2015 at Unknown time  . clindamycin (CLEOCIN T) 1 % external solution Apply 1 application topically 2 (two) times daily.  7 Past Month at Unknown time  . Cyanocobalamin (VITAMIN B-12 IJ) Inject 500 mLs as directed every 30 (thirty) days.    Past Month at Unknown time  . flecainide (TAMBOCOR) 50 MG tablet TAKE 1 TABLET (50 MG TOTAL) BY MOUTH 2 (TWO) TIMES DAILY. 60 tablet 5 02/22/2015 at Unknown time  . fluticasone (FLONASE) 50 MCG/ACT nasal spray  Place 1 spray into both nostrils 2 (two) times daily.   02/22/2015 at Unknown time  . guaiFENesin (MUCINEX) 600 MG 12 hr tablet Take 600 mg by mouth 2 (two) times daily.    Past Month at Unknown time  . HYDROcodone-acetaminophen (NORCO/VICODIN) 5-325 MG per tablet Take 1-2 tablets by mouth every 6 (six) hours as needed for moderate pain.   Past Month at Unknown time  . loratadine (CLARITIN) 10 MG tablet Take 10 mg by mouth daily.   Past Month at Unknown time  . LORazepam (ATIVAN) 0.5 MG tablet Take 0.5 mg by mouth 2 (two) times daily.   Past Month at Unknown time  . lovastatin (MEVACOR) 40 MG tablet Take 40 mg by mouth every evening.    02/22/2015 at Unknown time  . metoprolol tartrate (LOPRESSOR) 25 MG tablet TAKE 1 TABLET (25 MG TOTAL) BY MOUTH 2 (TWO) TIMES DAILY. (Patient taking differently: TAKE 0.5 TABLET (25 MG TOTAL) BY MOUTH 2 (TWO) TIMES DAILY.) 60 tablet 6 02/22/2015 at Unknown time  . omeprazole (PRILOSEC) 40 MG capsule Take 40 mg by mouth daily before breakfast.    02/22/2015 at Unknown time  . oxybutynin (DITROPAN) 5 MG  tablet Take 5 mg by mouth 3 (three) times daily.   6 Past Month at Unknown time  . Polyethylene Glycol 3350 (MIRALAX PO) Take 17 g by mouth every morning.    Past Month at Unknown time  . QUEtiapine (SEROQUEL) 50 MG tablet Take 50 mg by mouth at bedtime.   02/22/2015 at Unknown time  . warfarin (COUMADIN) 5 MG tablet Take 2.5-5 mg by mouth every other day. Take 0.5 tab (2.5mg ) orally once a day then alternate with  1 tablet (5mg ) orally once a day.   02/22/2015 at Unknown time  . dextromethorphan (DELSYM) 30 MG/5ML liquid Take 15 mg by mouth 2 (two) times daily.   01/30/2015 at Unknown time  . flecainide (TAMBOCOR) 50 MG tablet TAKE 1 TABLET (50 MG TOTAL) BY MOUTH 2 (TWO) TIMES DAILY. 60 tablet 3   . ipratropium-albuterol (DUONEB) 0.5-2.5 (3) MG/3ML SOLN Inhale 3 mLs into the lungs daily as needed.   01/28/2015  . ketoconazole (NIZORAL) 2 % shampoo Apply 1 application topically  once a week. on Fridays.   01/29/2015 at Unknown time  . metoprolol tartrate (LOPRESSOR) 25 MG tablet TAKE 1 TABLET (25 MG TOTAL) BY MOUTH 2 (TWO) TIMES DAILY. (Patient not taking: Reported on 01/19/2015) 60 tablet 3   . NON FORMULARY cpap daily at bedtime.   01/29/2015 at Unknown time  . RENACIDIN irrigation 30 mLs by Bladder Irrigation route 3 (three) times daily.  12 01/30/2015 at Unknown time    Assessment: Patient admitted for UTI. Takes Coumadin for a-fib and history of DVT/PE. Home dose is 2.5mg  alternating with 5mg . INR of 1.6 today, appears patient may have missed a dose on 6/14.  Goal of Therapy:  INR 2-3 Monitor platelets by anticoagulation protocol: Yes   Plan:  Will order Coumadin 5mg  for today's dose. Will check daily INR and order Coumadin daily according to INR results.   Paulina Fusi, PharmD, BCPS 02/25/2015 10:27 AM

## 2015-02-25 NOTE — Plan of Care (Signed)
Problem: Discharge Progression Outcomes Goal: Discharge plan in place and appropriate Individualization:  Outcome: Progressing Individualization: Pt prefers to be called Kent [name] who lives at home with his parents. Hx HTN, chronic UTI, frontal lobe dementia, anxiety, new-onset a fib controlled by home medications Pt is bedbound and nonverbal at baseline needing total care to meet pt needs.

## 2015-02-26 LAB — PROTIME-INR
INR: 1.5
Prothrombin Time: 18.3 seconds — ABNORMAL HIGH (ref 11.4–15.0)

## 2015-02-26 MED ORDER — WARFARIN SODIUM 5 MG PO TABS: 5.0000 mg | ORAL_TABLET | Freq: Once | ORAL | Status: DC

## 2015-02-26 MED ORDER — DEXTROSE 5 % IV SOLN: 1.0000 g | Freq: Three times a day (TID) | INTRAVENOUS | Status: DC

## 2015-02-26 NOTE — Care Management (Signed)
Discharge to home today per Dr. Tressia Miners. [facility] will be administering IV antibiotics. Also, resuming home health services, Will be transported per [facility] per father's request. Shelbie Ammons RN MSN Care Management [phone]

## 2015-02-26 NOTE — Progress Notes (Signed)
[place] at [place]    MR#:  [id number]  DATE OF BIRTH:  09-24-1965  SUBJECTIVE:  - Patient more alert and interactive today. Able to track me and responded when I said 'hi'. -Added at bedside and pleased with progress. -PICC line placed and for discharge home with home health today  REVIEW OF SYSTEMS:   Review of Systems  Unable to perform ROS: mental acuity   Tolerating Diet:yes Tolerating PT: no Bedbound  DRUG ALLERGIES:   Allergies  Allergen Reactions  . Codeine Nausea Only  . Vancomycin Other (See Comments)    Red mans syndrome   . Zosyn [Piperacillin Sod-Tazobactam So] Rash    Arm erythema on administration, but tolerated cephalosporins fine    VITALS:  Blood pressure 121/77, pulse 59, temperature 98.6 F (37 C), temperature source Oral, resp. rate 20, height 5\' 11"  (1.803 m), weight 132.632 kg (292 lb 6.4 oz), SpO2 98 %.  PHYSICAL EXAMINATION:   Physical Exam  GENERAL: 49 y.o.-year-old patient lying in the bed with no acute distress. Nonverbal at baseline EYES: Pupils equal, round, reactive to light and accommodation. No scleral icterus. Extraocular muscles intact.  HEENT: Head atraumatic, normocephalic. Oropharynx and nasopharynx clear. Poor dentition NECK: Supple, no jugular venous distention. No thyroid enlargement, no tenderness.  LUNGS: Normal breath sounds bilaterally, no wheezing, rales,rhonchi or crepitation. No use of accessory muscles of respiration. Decreased bibasilar breath sounds CARDIOVASCULAR: S1, S2 normal. No murmurs, rubs, or gallops.  ABDOMEN: Soft, nontender, nondistended. Bowel sounds present. No organomegaly or mass. Suprapubic catheter in place EXTREMITIES: No cyanosis, or clubbing. 2+ edema of both feet. PICC line is placed in left arm NEUROLOGIC: Nonverbal at baseline, no new neurological deficits. Does not move his lower extremities, they are spastic. Minimal  movement of both upper extremities noted. No facial droop noted. Gait not checked. Patient is wheelchair bound. Alert and tracking with his eyes, not following any commands. PSYCHIATRIC: The patient is alert and nonverbal,.  SKIN: No obvious rash, lesion, or ulcer.   LABORATORY PANEL:   CBC  Recent Labs Lab 02/24/15 0610  WBC 5.5  HGB 11.4*  HCT 35.9*  PLT 287    Chemistries   Recent Labs Lab 02/23/15 1558 02/24/15 0610  NA 138 140  K 3.7 4.0  CL 103 106  CO2 29 27  GLUCOSE 127* 81  BUN 11 9  CREATININE 0.50* 0.44*  CALCIUM 9.1 8.8*  AST 32  --   ALT 31  --   ALKPHOS 81  --   BILITOT 0.4  --     Cardiac Enzymes  Recent Labs Lab 02/23/15 1558  TROPONINI <0.03    RADIOLOGY:  Dg Chest 1 View  02/25/2015   CLINICAL DATA:  Re- adjustment of PICC line  EXAM: CHEST  1 VIEW  COMPARISON:  Portable exam 1651 hours compared to 02/25/2015 at 1639 hours  FINDINGS: LEFT arm PICC line tip projects over mid SVC.  Low lung volumes.  Enlargement of cardiac silhouette.  Mediastinal contour stable.  Bibasilar atelectasis greater on RIGHT.  Upper lungs clear.  IMPRESSION: Tip of LEFT arm PICC line now projects over mid SVC.  Low lung volumes with bibasilar atelectasis and enlargement of cardiac silhouette.   Electronically Signed   By: Lavonia Dana M.D.   On: 02/25/2015 17:03   Dg Chest 1 View  02/25/2015   CLINICAL DATA:  PICC line placement.  EXAM: CHEST  1 VIEW  COMPARISON:  02/23/2015  FINDINGS: Left-sided PICC line extends into the neck. Midline trachea. Cardiomegaly accentuated by AP portable technique. No definite pleural fluid. No pneumothorax. Low lung volumes with resultant pulmonary interstitial prominence. Persistent right base opacity with right hemidiaphragm elevation.  IMPRESSION: Left-sided PICC line malposition, extending into the neck. This was subsequently repositioned on the study of (therefore was not directly communicated to the floor).  Otherwise, similar  appearance the chest with right hemidiaphragm elevation and adjacent atelectasis or scarring.  No pneumothorax.   Electronically Signed   By: Abigail Miyamoto M.D.   On: 02/25/2015 16:53   Dg Chest 1 View  02/25/2015   CLINICAL DATA:  Repositioning of PICC line.  EXAM: CHEST  1 VIEW  COMPARISON:  Earlier in the day at 1627 hours.  FINDINGS: 1639 hours. Left-sided PICC line has been repositioned. Terminates at the high to mid SVC.  Cardiomegaly accentuated by AP portable technique. No definite pleural fluid. No pneumothorax. Further diminished lung volumes with persistent right and developing left base airspace disease. Right hemidiaphragm elevation.  IMPRESSION: Repositioning of left-sided PICC line, now appropriately positioned without pneumothorax.  Diminished inspiratory effort with persistent right base scar or atelectasis and developing left base atelectasis.   Electronically Signed   By: Abigail Miyamoto M.D.   On: 02/25/2015 16:52     ASSESSMENT AND PLAN:   48 y.o. male with a known history of chronic progressive neurodegenerative disease with bedbound status at baseline, nonverbal was brought in by his father secondary to decreased appetite and hypothermia.  #1 sepsis-with hypothermia and metabolic encephalopathy. - Likely source UTI again. Due to Pseudomonas bacteria -Multiple cultures growing Pseudomonas, likely going to be multidrug resistant. Cultures from this admission are pending. -His suprapubic catheter was just changed on 02/19/2015 at urologist office. -Appreciate infectious disease consult. Treatment of Pseudomonas for him per ID, needs to be 3 weeks of IV Fortaz. -10 days into treatment with IV antibiotics may be suprapubic catheter needs to be changed again. -PICC line has been placed -Urine cultures and blood cultures are negative so far. -Chest x-ray negative for any pneumonia.  #2 metabolic encephalopathy-secondary to his underlying infection.  -Not sure if this is progression  of his underlying neurodegenerative condition versus infection.  -he was seen by palliative care team last admission andpt is a DO NOT RESUSCITATE. -He lives at home with his parents taking care of him and also nurse from home health -Overall poor prognosis  #3 hypertension-on metoprolol  #4 atrial fibrillation - heart rate is well controlled.  -Patient on metoprolol and flecainide -On Coumadin for anticoagulation.  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 -INR is subtherapeutic at 1.5 today. Continue current dose of Coumadin.  - patient likely did not get his Coumadin on the day of admission and also is getting a lower dose here. Increase the dose and recheck INR by home health in a couple of days.  Management plans discussed with the family and they are in agreement.  CODE STATUS: DNR  DVT Prophylaxis: coumadin  TOTAL TIME TAKING CARE OF THIS PATIENT:25 minutes.  >50% time spent on counselling and coordination of care  Discussed with father at bedside, and also with Dr. Ola Spurr.  Discharge home today with home health.   Gladstone Lighter M.D on 02/26/2015 at 2:10 PM  Between 7am to 6pm - Pager - [phone]  After 6pm go to [email or web address] - password [email or web address]  [place] [facility]  Office  [phone]  CC: Primary  care physician; Ashok Norris, MD

## 2015-02-26 NOTE — Discharge Summary (Signed)
[place] at [place]    MR#:  [id number]  DATE OF BIRTH:  10-Feb-1966  DATE OF ADMISSION:  02/23/2015 ADMITTING PHYSICIAN: Gladstone Lighter, MD  DATE OF DISCHARGE: 02/26/2015  PRIMARY CARE PHYSICIAN: Ashok Norris, MD    ADMISSION DIAGNOSIS:  UTI (lower urinary tract infection) [N39.0] Hypothermia, initial encounter [T68.XXXA] Sepsis [A41.9]  DISCHARGE DIAGNOSIS:  Active Problems:   Cystitis   SECONDARY DIAGNOSIS:   Past Medical History  Diagnosis Date  . Frontotemporal dementia   . Sleep apnea   . Hypertension   . Depression   . Neurogenic bladder   . Myelopathy   . Obesity   . Degeneration, intervertebral disc, cervical   . Elevated liver function tests   . Pulmonary embolus     on chronic anticoagulation  . Chronic UTI (urinary tract infection)   . Pernicious anemia   . Rosacea   . BPH (benign prostatic hypertrophy)   . History of DVT (deep vein thrombosis)   . History of pulmonary embolism     currently on coumadin  . Anxiety   . New onset a-fib 2013    HOSPITAL COURSE:   49 y.o. male with a known history of chronic progressive neurodegenerative disease with bedbound status at baseline, nonverbal was brought in by his father secondary to decreased appetite and hypothermia.  #1 sepsis-with hypothermia and metabolic encephalopathy. - Likely source UTI again. Due to Pseudomonas bacteria -Multiple cultures growing Pseudomonas, likely going to be multidrug resistant. Cultures from this admission are pending. -His suprapubic catheter was just changed on 02/19/2015 at urologist office. -Appreciate infectious disease consult. Treatment of Pseudomonas for him per ID, needs to be 3 weeks of IV Fortaz. -10 days into treatment with IV antibiotics may be suprapubic catheter needs to be changed again. -PICC line has been placed -Urine cultures and blood cultures are negative so far. -Chest x-ray  negative for any pneumonia.  #2 metabolic encephalopathy-secondary to his underlying infection.  -Not sure if this is progression of his underlying neurodegenerative condition versus infection.  -he was seen by palliative care team last admission andpt is a DO NOT RESUSCITATE. -He lives at home with his parents taking care of him and also nurse from home health -Overall poor prognosis  #3 hypertension-on metoprolol  #4 atrial fibrillation - heart rate is well controlled.  -Patient on metoprolol and flecainide -On Coumadin for anticoagulation.  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 -INR is subtherapeutic at 1.5 today. Continue current dose of Coumadin.  - patient likely did not get his Coumadin on the day of admission and also is getting a lower dose here. Increase the dose and recheck INR by home health in a couple of days.  DISCHARGE CONDITIONS:   Stable  CONSULTS OBTAINED:  Treatment Team:  Adrian Prows, MD  DRUG ALLERGIES:   Allergies  Allergen Reactions  . Codeine Nausea Only  . Vancomycin Other (See Comments)    Red mans syndrome   . Zosyn [Piperacillin Sod-Tazobactam So] Rash    Arm erythema on administration, but tolerated cephalosporins fine    DISCHARGE MEDICATIONS:   Current Discharge Medication List    START taking these medications   Details  cefTAZidime 1 g in dextrose 5 % 50 mL Inject 1 g into the vein every 8 (eight) hours. Qty: 54 g, Refills: 0      CONTINUE these medications which have NOT CHANGED   Details  benzonatate (TESSALON) 100 MG capsule TAKE ONE CAPSULE BY MOUTH EVERY 8 HOURS AS NEEDED Qty: 30 capsule, Refills: 0    citalopram (CELEXA) 20 MG tablet Take 20 mg by mouth 2 (two) times daily.    clindamycin (CLEOCIN T) 1 % external solution Apply 1 application topically 2 (two) times daily. Refills: 7    Cyanocobalamin (VITAMIN B-12 IJ) Inject 500 mLs as directed every 30 (thirty) days.      flecainide (TAMBOCOR) 50 MG tablet TAKE 1 TABLET (50 MG TOTAL) BY MOUTH 2 (TWO) TIMES DAILY. Qty: 60 tablet, Refills: 5    fluticasone (FLONASE) 50 MCG/ACT nasal spray Place 1 spray into both nostrils 2 (two) times daily.    guaiFENesin (MUCINEX) 600 MG 12 hr tablet Take 600 mg by mouth 2 (two) times daily.     HYDROcodone-acetaminophen (NORCO/VICODIN) 5-325 MG per tablet Take 1-2 tablets by mouth every 6 (six) hours as needed for moderate pain.    loratadine (CLARITIN) 10 MG tablet Take 10 mg by mouth daily.    LORazepam (ATIVAN) 0.5 MG tablet Take 0.5 mg by mouth 2 (two) times daily.    lovastatin (MEVACOR) 40 MG tablet Take 40 mg by mouth every evening.     metoprolol tartrate (LOPRESSOR) 25 MG tablet TAKE 1 TABLET (25 MG TOTAL) BY MOUTH 2 (TWO) TIMES DAILY. Qty: 60 tablet, Refills: 6    omeprazole (PRILOSEC) 40 MG capsule Take 40 mg by mouth daily before breakfast.     oxybutynin (DITROPAN) 5 MG tablet Take 5 mg by mouth 3 (three) times daily.  Refills: 6    Polyethylene Glycol 3350 (MIRALAX PO) Take 17 g by mouth every morning.     QUEtiapine (SEROQUEL) 50 MG tablet Take 50 mg by mouth at bedtime.    warfarin (COUMADIN) 5 MG tablet Take 2.5-5 mg by mouth every other day. Take 0.5 tab (2.5mg ) orally once a day then alternate with  1 tablet (5mg ) orally once a day.    dextromethorphan (DELSYM) 30 MG/5ML liquid Take 15 mg by mouth 2 (two) times daily.    ipratropium-albuterol (DUONEB) 0.5-2.5 (3) MG/3ML SOLN Inhale 3 mLs into the lungs daily as needed.    ketoconazole (NIZORAL) 2 % shampoo Apply 1 application topically once a week. on Fridays.    NON FORMULARY cpap daily at bedtime.    RENACIDIN irrigation 30 mLs by Bladder Irrigation route 3 (three) times daily. Refills: 12      STOP taking these medications     cefpodoxime (VANTIN) 200 MG tablet          DISCHARGE INSTRUCTIONS:   1. PCP f/u in 1-2 weeks 2. Urology f/u with Dr. Jacqlyn Larsen in 1 week 3. ID f/u with  Dr. Ola Spurr in 2 weeks  If you experience worsening of your admission symptoms, develop shortness of breath, life threatening emergency, suicidal or homicidal thoughts you must seek medical attention immediately by calling 911 or calling your MD immediately  if symptoms less severe.  You Must read complete instructions/literature along with all the possible adverse reactions/side effects for all the Medicines you take and that have been prescribed to you. Take any new Medicines after you have completely understood and accept all the possible adverse reactions/side effects.   Please note  You were cared for by a hospitalist during your hospital stay. If you have any questions about your discharge medications or the care you received while you were in the hospital after you are discharged, you can call the unit  and asked to speak with the hospitalist on call if the hospitalist that took care of you is not available. Once you are discharged, your primary care physician will handle any further medical issues. Please note that NO REFILLS for any discharge medications will be authorized once you are discharged, as it is imperative that you return to your primary care physician (or establish a relationship with a primary care physician if you do not have one) for your aftercare needs so that they can reassess your need for medications and monitor your lab values.    Today   CHIEF COMPLAINT:   Chief Complaint  Patient presents with  . Dysuria    VITAL SIGNS:  Blood pressure 121/77, pulse 59, temperature 98.6 F (37 C), temperature source Oral, resp. rate 20, height 5\' 11"  (1.803 m), weight 132.632 kg (292 lb 6.4 oz), SpO2 98 %.  I/O:   Intake/Output Summary (Last 24 hours) at 02/26/15 0738 Last data filed at 02/26/15 0537  Gross per 24 hour  Intake      0 ml  Output   2875 ml  Net  -2875 ml    PHYSICAL EXAMINATION:   Physical Exam  GENERAL: 49 y.o.-year-old patient lying in the bed  with no acute distress. Nonverbal at baseline EYES: Pupils equal, round, reactive to light and accommodation. No scleral icterus. Extraocular muscles intact.  HEENT: Head atraumatic, normocephalic. Oropharynx and nasopharynx clear. Poor dentition NECK: Supple, no jugular venous distention. No thyroid enlargement, no tenderness.  LUNGS: Normal breath sounds bilaterally, no wheezing, rales,rhonchi or crepitation. No use of accessory muscles of respiration. Decreased bibasilar breath sounds CARDIOVASCULAR: S1, S2 normal. No murmurs, rubs, or gallops.  ABDOMEN: Soft, nontender, nondistended. Bowel sounds present. No organomegaly or mass. Suprapubic catheter in place EXTREMITIES: No cyanosis, or clubbing. 2+ edema of both feet. PICC line is placed in left arm NEUROLOGIC: Nonverbal at baseline, no new neurological deficits. Does not move his lower extremities, they are spastic. Minimal movement of both upper extremities noted. No facial droop noted. Gait not checked. Patient is wheelchair bound. Alert and tracking with his eyes, not following any commands. PSYCHIATRIC: The patient is alert and nonverbal,.  SKIN: No obvious rash, lesion, or ulcer.   DATA REVIEW:   CBC  Recent Labs Lab 02/24/15 0610  WBC 5.5  HGB 11.4*  HCT 35.9*  PLT 287    Chemistries   Recent Labs Lab 02/23/15 1558 02/24/15 0610  NA 138 140  K 3.7 4.0  CL 103 106  CO2 29 27  GLUCOSE 127* 81  BUN 11 9  CREATININE 0.50* 0.44*  CALCIUM 9.1 8.8*  AST 32  --   ALT 31  --   ALKPHOS 81  --   BILITOT 0.4  --     Cardiac Enzymes  Recent Labs Lab 02/23/15 1558  TROPONINI <0.03    Microbiology Results  Results for orders placed or performed during the hospital encounter of 02/23/15  Blood culture (routine x 2)     Status: None (Preliminary result)   Collection Time: 02/23/15  3:57 PM  Result Value Ref Range Status   Specimen Description BLOOD  Final   Special Requests BLOOD  Final   Culture NO  GROWTH 3 DAYS  Final   Report Status PENDING  Incomplete  Urine culture     Status: None (Preliminary result)   Collection Time: 02/23/15  3:58 PM  Result Value Ref Range Status   Specimen Description URINE, CLEAN CATCH  Final  Special Requests NONE  Final   Culture TOO YOUNG TO READ  Final   Report Status PENDING  Incomplete  Blood culture (routine x 2)     Status: None (Preliminary result)   Collection Time: 02/23/15  4:10 PM  Result Value Ref Range Status   Specimen Description BLOOD  Final   Special Requests BLOOD  Final   Culture NO GROWTH 3 DAYS  Final   Report Status PENDING  Incomplete    RADIOLOGY:  Dg Chest 1 View  02/25/2015   CLINICAL DATA:  Re- adjustment of PICC line  EXAM: CHEST  1 VIEW  COMPARISON:  Portable exam 1651 hours compared to 02/25/2015 at 1639 hours  FINDINGS: LEFT arm PICC line tip projects over mid SVC.  Low lung volumes.  Enlargement of cardiac silhouette.  Mediastinal contour stable.  Bibasilar atelectasis greater on RIGHT.  Upper lungs clear.  IMPRESSION: Tip of LEFT arm PICC line now projects over mid SVC.  Low lung volumes with bibasilar atelectasis and enlargement of cardiac silhouette.   Electronically Signed   By: Lavonia Dana M.D.   On: 02/25/2015 17:03   Dg Chest 1 View  02/25/2015   CLINICAL DATA:  PICC line placement.  EXAM: CHEST  1 VIEW  COMPARISON:  02/23/2015  FINDINGS: Left-sided PICC line extends into the neck. Midline trachea. Cardiomegaly accentuated by AP portable technique. No definite pleural fluid. No pneumothorax. Low lung volumes with resultant pulmonary interstitial prominence. Persistent right base opacity with right hemidiaphragm elevation.  IMPRESSION: Left-sided PICC line malposition, extending into the neck. This was subsequently repositioned on the study of (therefore was not directly communicated to the floor).  Otherwise, similar appearance the chest with right hemidiaphragm elevation and adjacent atelectasis or scarring.  No  pneumothorax.   Electronically Signed   By: Abigail Miyamoto M.D.   On: 02/25/2015 16:53   Dg Chest 1 View  02/25/2015   CLINICAL DATA:  Repositioning of PICC line.  EXAM: CHEST  1 VIEW  COMPARISON:  Earlier in the day at 1627 hours.  FINDINGS: 1639 hours. Left-sided PICC line has been repositioned. Terminates at the high to mid SVC.  Cardiomegaly accentuated by AP portable technique. No definite pleural fluid. No pneumothorax. Further diminished lung volumes with persistent right and developing left base airspace disease. Right hemidiaphragm elevation.  IMPRESSION: Repositioning of left-sided PICC line, now appropriately positioned without pneumothorax.  Diminished inspiratory effort with persistent right base scar or atelectasis and developing left base atelectasis.   Electronically Signed   By: Abigail Miyamoto M.D.   On: 02/25/2015 16:52    EKG:   Orders placed or performed during the hospital encounter of 01/19/15  . ED EKG  . ED EKG  . EKG      Management plans discussed with the patient, family and they are in agreement.  CODE STATUS:     Code Status Orders        Start     Ordered   02/23/15 2049  Do not attempt resuscitation (DNR)   Continuous    Question Answer Comment  In the event of cardiac or respiratory ARREST Do not call a "code blue"   In the event of cardiac or respiratory ARREST Do not perform Intubation, CPR, defibrillation or ACLS   In the event of cardiac or respiratory ARREST Use medication by any route, position, wound care, and other measures to relive pain and suffering. May use oxygen, suction and manual treatment of airway obstruction as needed  for comfort.      02/23/15 2048    Advance Directive Documentation        Most Recent Value   Type of Advance Directive  Healthcare Power of Attorney   Pre-existing out of facility DNR order (yellow form or pink MOST form)     "MOST" Form in Place?        TOTAL TIME TAKING CARE OF THIS PATIENT: 40 minutes.     Gladstone Lighter M.D on 02/26/2015 at 7:38 AM  Between 7am to 6pm - Pager - [phone]  After 6pm go to [email or web address] - password [email or web address] [place] [facility]  Office  [phone]  CC: Primary care physician; Ashok Norris, MD

## 2015-02-26 NOTE — Plan of Care (Addendum)
Problem: Discharge Progression Outcomes Goal: Other Discharge Outcomes/Goals Outcome: Progressing Patient planning to discharge home today with home health.  Discharging home on IV antibiotics.  Father at bedside and Patient alert and at baseline.  PICC line placed for IV antibiotics.  Will go home via non emergency transport.  All discharge instructions reviewed with Mother and signed.  [place] to visit this evening for third IV antibiotic dose of the day.  Patient left via EMS at 1535.

## 2015-02-26 NOTE — Discharge Instructions (Signed)
°  DIET:  Cardiac diet  DISCHARGE CONDITION:  Stable  ACTIVITY:  Activity as tolerated  OXYGEN:  Home Oxygen: No.   Oxygen Delivery: room air  DISCHARGE LOCATION:  Home with home health   If you experience worsening of your admission symptoms, develop shortness of breath, life threatening emergency, suicidal or homicidal thoughts you must seek medical attention immediately by calling 911 or calling your MD immediately  if symptoms less severe.  You Must read complete instructions/literature along with all the possible adverse reactions/side effects for all the Medicines you take and that have been prescribed to you. Take any new Medicines after you have completely understood and accpet all the possible adverse reactions/side effects.   Please note  You were cared for by a hospitalist during your hospital stay. If you have any questions about your discharge medications or the care you received while you were in the hospital after you are discharged, you can call the unit and asked to speak with the hospitalist on call if the hospitalist that took care of you is not available. Once you are discharged, your primary care physician will handle any further medical issues. Please note that NO REFILLS for any discharge medications will be authorized once you are discharged, as it is imperative that you return to your primary care physician (or establish a relationship with a primary care physician if you do not have one) for your aftercare needs so that they can reassess your need for medications and monitor your lab values.   Antibiotic Medication Antibiotic medicine helps fight germs. Germs cause infections. This type of medicine will not work for colds, flu, or other viral infections. Tell your doctor if you:  Are allergic to any medicines.  Are pregnant or are trying to get pregnant.  Are taking other medicines.  Have other medical problems. HOME CARE  Take your medicine with a glass  of water or food as told by your doctor.  Take the medicine as told. Finish them even if you start to feel better.  Do not give your medicine to other people.  Do not use your medicine in the future for a different infection.  Ask your doctor about which side effects to watch for.  Try not to miss any doses. If you miss a dose, take it as soon as possible. If it is almost time for your next dose, and your dosing schedule is:  Two doses a day, take the missed dose and the next dose 5 to 6 hours later.  Three or more doses a day, take the missed dose and the next dose 2 to 4 hours later, or double your next dose.  Then go back to your normal schedule. GET HELP RIGHT AWAY IF:   You get worse or do not get better within a few days.  The medicine makes you sick.  You develop a rash or any other side effects.  You have questions or concerns. MAKE SURE YOU:  Understand these instructions.  Will watch your condition.  Will get help right away if you are not doing well or get worse. Document Released: 06/06/2008 Document Revised: 11/20/2011 Document Reviewed: 08/03/2009 [facility] Patient Information 2015 [place], [place]. This information is not intended to replace advice given to you by your health care provider. Make sure you discuss any questions you have with your health care provider.

## 2015-02-26 NOTE — Progress Notes (Signed)
ANTICOAGULATION CONSULT NOTE - Follow Up Consult  Pharmacy Consult for Coumadin Indication: atrial fibrillation and DVT  Allergies  Allergen Reactions  . Codeine Nausea Only  . Vancomycin Other (See Comments)    Red mans syndrome   . Zosyn [Piperacillin Sod-Tazobactam So] Rash    Arm erythema on administration, but tolerated cephalosporins fine    Patient Measurements: Height: 5\' 11"  (180.3 cm) Weight: 292 lb 6.4 oz (132.632 kg) IBW/kg (Calculated) : 75.3 Heparin Dosing Weight: n/a  Vital Signs: Temp: 98.6 F (37 C) (06/17 0508) Temp Source: Oral (06/17 0508) BP: 121/77 mmHg (06/17 0508) Pulse Rate: 59 (06/17 0508)  Labs:  Recent Labs  02/23/15 1558 02/23/15 1701  02/24/15 0610 02/25/15 0534 02/26/15 0453  HGB  --  11.6*  --  11.4*  --   --   HCT  --  35.7*  --  35.9*  --   --   PLT  --  287  --  287  --   --   LABPROT  --   --   < > 30.0* 19.2* 18.3*  INR  --   --   < > 2.85 1.60 1.50  CREATININE 0.50*  --   --  0.44*  --   --   TROPONINI <0.03  --   --   --   --   --   < > = values in this interval not displayed.  Estimated Creatinine Clearance: 155.1 mL/min (by C-G formula based on Cr of 0.44).   Medications:  Prescriptions prior to admission  Medication Sig Dispense Refill Last Dose  . benzonatate (TESSALON) 100 MG capsule TAKE ONE CAPSULE BY MOUTH EVERY 8 HOURS AS NEEDED 30 capsule 0 Past Month at Unknown time  . cefpodoxime (VANTIN) 200 MG tablet Take 200 mg by mouth 2 (two) times daily. For 10 days     . citalopram (CELEXA) 20 MG tablet Take 20 mg by mouth 2 (two) times daily.   02/22/2015 at Unknown time  . clindamycin (CLEOCIN T) 1 % external solution Apply 1 application topically 2 (two) times daily.  7 Past Month at Unknown time  . Cyanocobalamin (VITAMIN B-12 IJ) Inject 500 mLs as directed every 30 (thirty) days.    Past Month at Unknown time  . flecainide (TAMBOCOR) 50 MG tablet TAKE 1 TABLET (50 MG TOTAL) BY MOUTH 2 (TWO) TIMES DAILY. 60 tablet 5  02/22/2015 at Unknown time  . fluticasone (FLONASE) 50 MCG/ACT nasal spray Place 1 spray into both nostrils 2 (two) times daily.   02/22/2015 at Unknown time  . guaiFENesin (MUCINEX) 600 MG 12 hr tablet Take 600 mg by mouth 2 (two) times daily.    Past Month at Unknown time  . HYDROcodone-acetaminophen (NORCO/VICODIN) 5-325 MG per tablet Take 1-2 tablets by mouth every 6 (six) hours as needed for moderate pain.   Past Month at Unknown time  . loratadine (CLARITIN) 10 MG tablet Take 10 mg by mouth daily.   Past Month at Unknown time  . LORazepam (ATIVAN) 0.5 MG tablet Take 0.5 mg by mouth 2 (two) times daily.   Past Month at Unknown time  . lovastatin (MEVACOR) 40 MG tablet Take 40 mg by mouth every evening.    02/22/2015 at Unknown time  . metoprolol tartrate (LOPRESSOR) 25 MG tablet TAKE 1 TABLET (25 MG TOTAL) BY MOUTH 2 (TWO) TIMES DAILY. (Patient taking differently: TAKE 0.5 TABLET (25 MG TOTAL) BY MOUTH 2 (TWO) TIMES DAILY.) 60 tablet 6 02/22/2015 at  Unknown time  . omeprazole (PRILOSEC) 40 MG capsule Take 40 mg by mouth daily before breakfast.    02/22/2015 at Unknown time  . oxybutynin (DITROPAN) 5 MG tablet Take 5 mg by mouth 3 (three) times daily.   6 Past Month at Unknown time  . Polyethylene Glycol 3350 (MIRALAX PO) Take 17 g by mouth every morning.    Past Month at Unknown time  . QUEtiapine (SEROQUEL) 50 MG tablet Take 50 mg by mouth at bedtime.   02/22/2015 at Unknown time  . warfarin (COUMADIN) 5 MG tablet Take 2.5-5 mg by mouth every other day. Take 0.5 tab (2.5mg ) orally once a day then alternate with  1 tablet (5mg ) orally once a day.   02/22/2015 at Unknown time  . dextromethorphan (DELSYM) 30 MG/5ML liquid Take 15 mg by mouth 2 (two) times daily.   01/30/2015 at Unknown time  . flecainide (TAMBOCOR) 50 MG tablet TAKE 1 TABLET (50 MG TOTAL) BY MOUTH 2 (TWO) TIMES DAILY. 60 tablet 3   . ipratropium-albuterol (DUONEB) 0.5-2.5 (3) MG/3ML SOLN Inhale 3 mLs into the lungs daily as needed.    01/28/2015  . ketoconazole (NIZORAL) 2 % shampoo Apply 1 application topically once a week. on Fridays.   01/29/2015 at Unknown time  . metoprolol tartrate (LOPRESSOR) 25 MG tablet TAKE 1 TABLET (25 MG TOTAL) BY MOUTH 2 (TWO) TIMES DAILY. (Patient not taking: Reported on 01/19/2015) 60 tablet 3   . NON FORMULARY cpap daily at bedtime.   01/29/2015 at Unknown time  . RENACIDIN irrigation 30 mLs by Bladder Irrigation route 3 (three) times daily.  12 01/30/2015 at Unknown time    Assessment: Patient admitted for UTI. Takes Coumadin for a-fib and history of DVT/PE. Home dose is 2.5mg  alternating with 5mg . INR of 1.5 today, appears patient may have missed a dose on 6/14.  Goal of Therapy:  INR 2-3 Monitor platelets by anticoagulation protocol: Yes   Plan:  Will order Coumadin 5mg  for today's dose. Will check daily INR and order Coumadin daily according to INR results.   Paulina Fusi, PharmD, BCPS 02/26/2015 10:04 AM

## 2015-02-26 NOTE — Plan of Care (Signed)
Problem: Discharge Progression Outcomes Goal: Other Discharge Outcomes/Goals Outcome: Progressing Plan of care progress to goal for: Pain-no c/o pain this shift Hemodynamically-VSS, NS @ 396UG/AY Complications-no evidence of this shift Diet-pt tolerating diet at this time Activity-pt is on bedrest

## 2015-02-27 LAB — URINE CULTURE: Culture: 40000

## 2015-02-28 LAB — CULTURE, BLOOD (ROUTINE X 2)
Culture: NO GROWTH
Culture: NO GROWTH

## 2015-03-02 ENCOUNTER — Telehealth: Payer: Self-pay

## 2015-03-02 NOTE — Telephone Encounter (Signed)
Kent [name] from [facility] called and would like an order called in for lotrisone cream for a wound on his bottom

## 2015-03-03 ENCOUNTER — Telehealth: Payer: Self-pay | Admitting: Family Medicine

## 2015-03-03 NOTE — Telephone Encounter (Signed)
Amy at [facility] notified. No changes on coumadin. Continue current regimen.

## 2015-03-03 NOTE — Telephone Encounter (Signed)
Amy Pope from home health called to give report: PT: 23.7 INR: 2.4 Please call [phone] if you have any question

## 2015-03-03 NOTE — Telephone Encounter (Signed)
OK 

## 2015-03-03 NOTE — Telephone Encounter (Signed)
Spoke to Amy at [facility]. A script for Lotrisone cream has been called in to [facility]. Apply Lotrisone  To wound 2 times a day for 10 days. Per Dr. Rutherford Nail

## 2015-03-06 ENCOUNTER — Other Ambulatory Visit: Payer: Self-pay | Admitting: Family Medicine

## 2015-03-07 ENCOUNTER — Other Ambulatory Visit: Payer: Self-pay | Admitting: Family Medicine

## 2015-03-08 ENCOUNTER — Other Ambulatory Visit: Payer: Self-pay | Admitting: Family Medicine

## 2015-03-09 ENCOUNTER — Other Ambulatory Visit: Payer: Self-pay | Admitting: Family Medicine

## 2015-03-10 ENCOUNTER — Other Ambulatory Visit: Payer: Self-pay | Admitting: Family Medicine

## 2015-03-12 DIAGNOSIS — N390 Urinary tract infection, site not specified: Secondary | ICD-10-CM | POA: Insufficient documentation

## 2015-03-12 DIAGNOSIS — A498 Other bacterial infections of unspecified site: Secondary | ICD-10-CM | POA: Insufficient documentation

## 2015-03-22 ENCOUNTER — Ambulatory Visit: Payer: Self-pay | Admitting: Family Medicine

## 2015-03-22 ENCOUNTER — Emergency Department: Payer: Medicare Other

## 2015-03-22 ENCOUNTER — Observation Stay
Admission: EM | Admit: 2015-03-22 | Discharge: 2015-03-24 | Disposition: A | Discharge status: Home / Self Care | Payer: Medicare Other | Attending: Internal Medicine | Admitting: Internal Medicine

## 2015-03-22 ENCOUNTER — Encounter: Payer: Self-pay | Admitting: Emergency Medicine

## 2015-03-22 ENCOUNTER — Other Ambulatory Visit: Payer: Self-pay

## 2015-03-22 DIAGNOSIS — T68XXXA Hypothermia, initial encounter: Secondary | ICD-10-CM | POA: Diagnosis present

## 2015-03-22 DIAGNOSIS — R001 Bradycardia, unspecified: Secondary | ICD-10-CM | POA: Insufficient documentation

## 2015-03-22 DIAGNOSIS — N390 Urinary tract infection, site not specified: Secondary | ICD-10-CM | POA: Insufficient documentation

## 2015-03-22 DIAGNOSIS — I4891 Unspecified atrial fibrillation: Secondary | ICD-10-CM | POA: Diagnosis not present

## 2015-03-22 DIAGNOSIS — N319 Neuromuscular dysfunction of bladder, unspecified: Secondary | ICD-10-CM | POA: Diagnosis not present

## 2015-03-22 DIAGNOSIS — R600 Localized edema: Secondary | ICD-10-CM | POA: Insufficient documentation

## 2015-03-22 DIAGNOSIS — E669 Obesity, unspecified: Secondary | ICD-10-CM | POA: Insufficient documentation

## 2015-03-22 DIAGNOSIS — F329 Major depressive disorder, single episode, unspecified: Secondary | ICD-10-CM | POA: Diagnosis not present

## 2015-03-22 DIAGNOSIS — B965 Pseudomonas (aeruginosa) (mallei) (pseudomallei) as the cause of diseases classified elsewhere: Secondary | ICD-10-CM | POA: Diagnosis not present

## 2015-03-22 DIAGNOSIS — Z86711 Personal history of pulmonary embolism: Secondary | ICD-10-CM | POA: Insufficient documentation

## 2015-03-22 DIAGNOSIS — F419 Anxiety disorder, unspecified: Secondary | ICD-10-CM | POA: Diagnosis not present

## 2015-03-22 DIAGNOSIS — D510 Vitamin B12 deficiency anemia due to intrinsic factor deficiency: Secondary | ICD-10-CM | POA: Diagnosis not present

## 2015-03-22 DIAGNOSIS — Z66 Do not resuscitate: Secondary | ICD-10-CM | POA: Diagnosis not present

## 2015-03-22 DIAGNOSIS — R509 Fever, unspecified: Secondary | ICD-10-CM | POA: Diagnosis not present

## 2015-03-22 DIAGNOSIS — R609 Edema, unspecified: Secondary | ICD-10-CM

## 2015-03-22 DIAGNOSIS — Z7901 Long term (current) use of anticoagulants: Secondary | ICD-10-CM | POA: Insufficient documentation

## 2015-03-22 DIAGNOSIS — I10 Essential (primary) hypertension: Secondary | ICD-10-CM | POA: Diagnosis not present

## 2015-03-22 DIAGNOSIS — G4730 Sleep apnea, unspecified: Secondary | ICD-10-CM | POA: Diagnosis not present

## 2015-03-22 DIAGNOSIS — G3109 Other frontotemporal dementia: Secondary | ICD-10-CM | POA: Insufficient documentation

## 2015-03-22 DIAGNOSIS — Z86718 Personal history of other venous thrombosis and embolism: Secondary | ICD-10-CM | POA: Insufficient documentation

## 2015-03-22 DIAGNOSIS — N400 Enlarged prostate without lower urinary tract symptoms: Secondary | ICD-10-CM | POA: Insufficient documentation

## 2015-03-22 DIAGNOSIS — Z6838 Body mass index (BMI) 38.0-38.9, adult: Secondary | ICD-10-CM | POA: Insufficient documentation

## 2015-03-22 DIAGNOSIS — R680 Hypothermia, not associated with low environmental temperature: Secondary | ICD-10-CM | POA: Diagnosis not present

## 2015-03-22 LAB — CBC WITH DIFFERENTIAL/PLATELET
Basophils Absolute: 0 10*3/uL (ref 0–0.1)
Basophils Relative: 1 %
EOS ABS: 0.1 10*3/uL (ref 0–0.7)
EOS PCT: 2 %
HEMATOCRIT: 35.5 % — AB (ref 40.0–52.0)
HEMOGLOBIN: 11.4 g/dL — AB (ref 13.0–18.0)
Lymphocytes Relative: 13 %
Lymphs Abs: 1.1 10*3/uL (ref 1.0–3.6)
MCH: 28.1 pg (ref 26.0–34.0)
MCHC: 32.1 g/dL (ref 32.0–36.0)
MCV: 87.5 fL (ref 80.0–100.0)
MONO ABS: 0.8 10*3/uL (ref 0.2–1.0)
Monocytes Relative: 10 %
Neutro Abs: 6.4 10*3/uL (ref 1.4–6.5)
Neutrophils Relative %: 74 %
PLATELETS: 246 10*3/uL (ref 150–440)
RBC: 4.05 MIL/uL — ABNORMAL LOW (ref 4.40–5.90)
RDW: 18 % — ABNORMAL HIGH (ref 11.5–14.5)
WBC: 8.5 10*3/uL (ref 3.8–10.6)

## 2015-03-22 LAB — COMPREHENSIVE METABOLIC PANEL
ALBUMIN: 3.6 g/dL (ref 3.5–5.0)
ALT: 34 U/L (ref 17–63)
AST: 27 U/L (ref 15–41)
Alkaline Phosphatase: 78 U/L (ref 38–126)
Anion gap: 6 (ref 5–15)
BILIRUBIN TOTAL: 0.6 mg/dL (ref 0.3–1.2)
BUN: 11 mg/dL (ref 6–20)
CALCIUM: 9.2 mg/dL (ref 8.9–10.3)
CHLORIDE: 102 mmol/L (ref 101–111)
CO2: 31 mmol/L (ref 22–32)
Creatinine, Ser: 0.39 mg/dL — ABNORMAL LOW (ref 0.61–1.24)
GFR calc Af Amer: >60 mL/min (ref >60)
GFR calc non Af Amer: >60 mL/min (ref >60)
Glucose, Bld: 93 mg/dL (ref 65–99)
POTASSIUM: 3.9 mmol/L (ref 3.5–5.1)
SODIUM: 139 mmol/L (ref 135–145)
Total Protein: 6.9 g/dL (ref 6.5–8.1)

## 2015-03-22 LAB — URINALYSIS COMPLETE WITH MICROSCOPIC (ARMC ONLY)
Bacteria, UA: NONE SEEN
Bilirubin Urine: NEGATIVE
GLUCOSE, UA: NEGATIVE mg/dL
HGB URINE DIPSTICK: NEGATIVE
Ketones, ur: NEGATIVE mg/dL
Nitrite: NEGATIVE
Protein, ur: 30 mg/dL — AB
Specific Gravity, Urine: 1.014 (ref 1.005–1.030)
Squamous Epithelial / LPF: NONE SEEN
pH: 7 (ref 5.0–8.0)

## 2015-03-22 LAB — TROPONIN I: Troponin I: <0.03 ng/mL (ref <0.031)

## 2015-03-22 LAB — PROTIME-INR
INR: 2.41
Prothrombin Time: 26.4 seconds — ABNORMAL HIGH (ref 11.4–15.0)

## 2015-03-22 LAB — LACTIC ACID, PLASMA: Lactic Acid, Venous: 1 mmol/L (ref 0.5–2.0)

## 2015-03-22 MED ORDER — SENNOSIDES-DOCUSATE SODIUM 8.6-50 MG PO TABS
1.0000 | ORAL_TABLET | Freq: Every evening | ORAL | Status: DC | PRN
  Administered 2015-03-23: 21:00:00 1 via ORAL
  Filled 2015-03-22: qty 1

## 2015-03-22 MED ORDER — BENZONATATE 100 MG PO CAPS: 100.0000 mg | ORAL_CAPSULE | Freq: Three times a day (TID) | ORAL | Status: DC | PRN

## 2015-03-22 MED ORDER — WARFARIN SODIUM 5 MG PO TABS: 2.5000 mg | ORAL_TABLET | ORAL | Status: DC

## 2015-03-22 MED ORDER — CITRIC ACID-D GLUCONIC ACID IR SOLN
30.0000 mL | Freq: Three times a day (TID) | Status: DC
  Administered 2015-03-22 – 2015-03-24 (×5): 30 mL
  Filled 2015-03-22: qty 500

## 2015-03-22 MED ORDER — CITALOPRAM HYDROBROMIDE 20 MG PO TABS
20.0000 mg | ORAL_TABLET | Freq: Two times a day (BID) | ORAL | Status: DC
  Administered 2015-03-22 – 2015-03-24 (×4): 20 mg via ORAL
  Filled 2015-03-22 (×4): qty 1

## 2015-03-22 MED ORDER — HYDROCODONE-ACETAMINOPHEN 5-325 MG PO TABS
1.0000 | ORAL_TABLET | Freq: Four times a day (QID) | ORAL | Status: DC | PRN
  Administered 2015-03-23: 2 via ORAL
  Filled 2015-03-22: qty 2

## 2015-03-22 MED ORDER — PANTOPRAZOLE SODIUM 40 MG PO TBEC
40.0000 mg | DELAYED_RELEASE_TABLET | Freq: Every day | ORAL | Status: DC
  Administered 2015-03-23 – 2015-03-24 (×2): 40 mg via ORAL
  Filled 2015-03-22 (×2): qty 1

## 2015-03-22 MED ORDER — POLYETHYLENE GLYCOL 3350 17 G PO PACK
17.0000 g | PACK | Freq: Every day | ORAL | Status: DC
Start: 2015-03-22 — End: 2015-03-24
  Administered 2015-03-23 – 2015-03-24 (×2): 17 g via ORAL
  Filled 2015-03-22 (×2): qty 1

## 2015-03-22 MED ORDER — IPRATROPIUM-ALBUTEROL 0.5-2.5 (3) MG/3ML IN SOLN
3.0000 mL | Freq: Four times a day (QID) | RESPIRATORY_TRACT | Status: DC
  Administered 2015-03-22 – 2015-03-23 (×2): 3 mL via RESPIRATORY_TRACT
  Filled 2015-03-22 (×2): qty 3

## 2015-03-22 MED ORDER — CLOTRIMAZOLE 1 % EX CREA
TOPICAL_CREAM | Freq: Two times a day (BID) | CUTANEOUS | Status: DC
  Administered 2015-03-22 – 2015-03-24 (×4): via TOPICAL
  Filled 2015-03-22: qty 15

## 2015-03-22 MED ORDER — KETOCONAZOLE 2 % EX SHAM
1.0000 "application " | MEDICATED_SHAMPOO | CUTANEOUS | Status: DC
  Filled 2015-03-22: qty 120

## 2015-03-22 MED ORDER — LORAZEPAM 0.5 MG PO TABS
0.5000 mg | ORAL_TABLET | Freq: Two times a day (BID) | ORAL | Status: DC
  Administered 2015-03-22 – 2015-03-24 (×4): 0.5 mg via ORAL
  Filled 2015-03-22 (×4): qty 1

## 2015-03-22 MED ORDER — FLECAINIDE ACETATE 50 MG PO TABS
50.0000 mg | ORAL_TABLET | Freq: Two times a day (BID) | ORAL | Status: DC
Start: 2015-03-22 — End: 2015-03-22

## 2015-03-22 MED ORDER — FLUTICASONE PROPIONATE 50 MCG/ACT NA SUSP
1.0000 | Freq: Two times a day (BID) | NASAL | Status: DC
  Administered 2015-03-22 – 2015-03-24 (×4): 1 via NASAL
  Filled 2015-03-22: qty 16

## 2015-03-22 MED ORDER — ACETAMINOPHEN 650 MG RE SUPP: 650.0000 mg | Freq: Four times a day (QID) | RECTAL | Status: DC | PRN

## 2015-03-22 MED ORDER — IPRATROPIUM-ALBUTEROL 0.5-2.5 (3) MG/3ML IN SOLN
3.0000 mL | Freq: Four times a day (QID) | RESPIRATORY_TRACT | Status: DC | PRN
  Administered 2015-03-23: 14:00:00 3 mL via RESPIRATORY_TRACT
  Filled 2015-03-22: qty 3

## 2015-03-22 MED ORDER — ONDANSETRON HCL 4 MG PO TABS: 4.0000 mg | ORAL_TABLET | Freq: Four times a day (QID) | ORAL | Status: DC | PRN

## 2015-03-22 MED ORDER — OXYBUTYNIN CHLORIDE 5 MG PO TABS
5.0000 mg | ORAL_TABLET | Freq: Three times a day (TID) | ORAL | Status: DC
  Administered 2015-03-22 – 2015-03-24 (×5): 5 mg via ORAL
  Filled 2015-03-22 (×6): qty 1

## 2015-03-22 MED ORDER — ONDANSETRON HCL 4 MG/2ML IJ SOLN: 4.0000 mg | Freq: Four times a day (QID) | INTRAMUSCULAR | Status: DC | PRN

## 2015-03-22 MED ORDER — BENZONATATE 100 MG PO CAPS
100.0000 mg | ORAL_CAPSULE | Freq: Two times a day (BID) | ORAL | Status: DC
  Administered 2015-03-22 – 2015-03-24 (×4): 100 mg via ORAL
  Filled 2015-03-22 (×4): qty 1

## 2015-03-22 MED ORDER — LORATADINE 10 MG PO TABS
10.0000 mg | ORAL_TABLET | Freq: Every day | ORAL | Status: DC
  Administered 2015-03-23 – 2015-03-24 (×2): 10 mg via ORAL
  Filled 2015-03-22 (×2): qty 1

## 2015-03-22 MED ORDER — FLECAINIDE ACETATE 50 MG PO TABS
50.0000 mg | ORAL_TABLET | Freq: Two times a day (BID) | ORAL | Status: DC
  Administered 2015-03-22 – 2015-03-24 (×4): 50 mg via ORAL
  Filled 2015-03-22 (×4): qty 1

## 2015-03-22 MED ORDER — WARFARIN SODIUM 5 MG PO TABS
2.5000 mg | ORAL_TABLET | Freq: Once | ORAL | Status: AC
  Administered 2015-03-22: 2.5 mg via ORAL
  Filled 2015-03-22: qty 1

## 2015-03-22 MED ORDER — PRAVASTATIN SODIUM 10 MG PO TABS
10.0000 mg | ORAL_TABLET | Freq: Every day | ORAL | Status: DC
  Administered 2015-03-23: 10 mg via ORAL
  Filled 2015-03-22: qty 1

## 2015-03-22 MED ORDER — QUETIAPINE FUMARATE 25 MG PO TABS
50.0000 mg | ORAL_TABLET | Freq: Every day | ORAL | Status: DC
  Administered 2015-03-22 – 2015-03-23 (×2): 50 mg via ORAL
  Filled 2015-03-22 (×2): qty 2

## 2015-03-22 MED ORDER — CLINDAMYCIN PHOSPHATE 1 % EX SOLN
1.0000 "application " | Freq: Two times a day (BID) | CUTANEOUS | Status: DC
  Administered 2015-03-23 – 2015-03-24 (×3): 1 via TOPICAL
  Filled 2015-03-22: qty 30

## 2015-03-22 MED ORDER — METOPROLOL TARTRATE 25 MG PO TABS
25.0000 mg | ORAL_TABLET | Freq: Two times a day (BID) | ORAL | Status: DC
  Administered 2015-03-22 – 2015-03-24 (×3): 25 mg via ORAL
  Filled 2015-03-22 (×3): qty 1

## 2015-03-22 MED ORDER — WARFARIN SODIUM 2.5 MG PO TABS: 2.5000 mg | ORAL_TABLET | Freq: Every day | ORAL | Status: DC

## 2015-03-22 MED ORDER — WARFARIN - PHYSICIAN DOSING INPATIENT
Freq: Every day | Status: DC
  Administered 2015-03-23: 17:00:00

## 2015-03-22 MED ORDER — DEXTROSE 5 % IV SOLN
1.0000 g | Freq: Once | INTRAVENOUS | Status: AC
  Administered 2015-03-23: 1 g via INTRAVENOUS
  Filled 2015-03-22 (×2): qty 1

## 2015-03-22 MED ORDER — WARFARIN SODIUM 5 MG PO TABS
5.0000 mg | ORAL_TABLET | ORAL | Status: DC
  Administered 2015-03-23: 17:00:00 5 mg via ORAL
  Filled 2015-03-22: qty 1

## 2015-03-22 MED ORDER — ACETAMINOPHEN 325 MG PO TABS
650.0000 mg | ORAL_TABLET | Freq: Four times a day (QID) | ORAL | Status: DC | PRN
  Administered 2015-03-23: 20:00:00 650 mg via ORAL
  Filled 2015-03-22: qty 2

## 2015-03-22 NOTE — Care Management (Signed)
Medicare observation letter reviewed with patient's mother and signed.  Signed copy sent to medical records

## 2015-03-22 NOTE — Progress Notes (Signed)
Notified MD that pt should be DNR per mother and father.  He is totally dependent on them and requested this status on his last visit to [facility].  Father also requested an order for clindamycin for foliculitis of the hair.  Awaiting response. Aris Georgia RN

## 2015-03-22 NOTE — H&P (Signed)
Kent [name] at [place]    MR#:  [id number]  DATE OF BIRTH:  October 28, 1965  DATE OF ADMISSION:  03/22/2015  PRIMARY CARE PHYSICIAN: Kent Norris, MD   REQUESTING/REFERRING PHYSICIAN: Dr. Reita [name]  CHIEF COMPLAINT:   Chief Complaint  Patient presents with  . Urinary Tract Infection    HISTORY OF PRESENT ILLNESS:  Kent [name]  is a 49 y.o. male with a known history of *under temporal dementia, neurogenic bladder with indwelling suprapubic catheter, recent multidrug-resistant UTI with Pseudomonas brought in by family secondary to hypothermia. Patient temperature this afternoon was 94 Fahrenheit. Concerning this hypothermia patient's family brought him here and they are concerned that his hypothermia associated with the UTI sometimes. Concerning hypothermia patient family thought he may have had he may have some infection so they brought him here. Patient family denies any recent nausea or vomiting or diarrhea to the patient. Patient is mostly bedridden and has a frontotemporal dementia. Dr. Landry [name] has been fall contacted in the ER he suggested monitoring him overnight without any anti-biotics. Patient has a fever secondary to urinary collagen than infection. Patient WBC is normal.  PAST MEDICAL HISTORY:   Past Medical History  Diagnosis Date  . Frontotemporal dementia   . Sleep apnea   . Hypertension   . Depression   . Neurogenic bladder   . Myelopathy   . Obesity   . Degeneration, intervertebral disc, cervical   . Elevated liver function tests   . Pulmonary embolus     on chronic anticoagulation  . Chronic UTI (urinary tract infection)   . Pernicious anemia   . Rosacea   . BPH (benign prostatic hypertrophy)   . History of DVT (deep vein thrombosis)   . History of pulmonary embolism     currently on coumadin  . Anxiety   . New onset a-fib 2013    PAST SURGICAL HISTOIRY:   Past Surgical History  Procedure  Laterality Date  . Suprapubic catheter insertion    . Kidney stone surgery      SOCIAL HISTORY:   History  Substance Use Topics  . Smoking status: Never Smoker   . Smokeless tobacco: Not on file  . Alcohol Use: No    FAMILY HISTORY:   Family History  Problem Relation Age of Onset  . Hypertension Father   . Hyperlipidemia Father   . Hypertension Mother     DRUG ALLERGIES:   Allergies  Allergen Reactions  . Codeine Nausea Only  . Vancomycin Other (See Comments)    Reaction:  Red man syndrome   . Zosyn [Piperacillin Sod-Tazobactam So] Rash and Other (See Comments)    Pt experienced arm erythema on administration, but tolerated cephalosporins fine.      REVIEW OF SYSTEMS:  CONSTITUTIONAL: No fever, fatigue or weakness.  EYES: No blurred or double vision.  EARS, NOSE, AND THROAT: No tinnitus or ear pain.  RESPIRATORY: No cough, shortness of breath, wheezing or hemoptysis.  CARDIOVASCULAR: No chest pain, orthopnea, edema.  GASTROINTESTINAL: No nausea, vomiting, diarrhea or abdominal pain.  GENITOURINARY: No dysuria, hematuria.  ENDOCRINE: No polyuria, nocturia,  HEMATOLOGY: No anemia, easy bruising or bleeding SKIN: No rash or lesion. MUSCULOSKELETAL: No joint pain or arthritis.   NEUROLOGIC: No tingling, numbness, weakness.  PSYCHIATRY: No anxiety or depression.   MEDICATIONS AT HOME:   Prior to Admission medications   Medication Sig Start Date End Date Taking? Authorizing Provider  benzonatate (TESSALON) 100 MG  capsule Take 100 mg by mouth every 8 (eight) hours as needed for cough.   Yes Historical Provider, MD  citalopram (CELEXA) 20 MG tablet Take 20 mg by mouth 2 (two) times daily.   Yes Historical Provider, MD  clindamycin (CLEOCIN T) 1 % external solution Apply 1 application topically 2 (two) times daily.    Yes Historical Provider, MD  cyanocobalamin (,VITAMIN B-12,) 1000 MCG/ML injection Inject 500 mcg into the muscle every 30 (thirty) days.   Yes Historical  Provider, MD  dextromethorphan (DELSYM) 30 MG/5ML liquid Take 15 mg by mouth 2 (two) times daily as needed for cough.    Yes Historical Provider, MD  flecainide (TAMBOCOR) 50 MG tablet Take 50 mg by mouth 2 (two) times daily.   Yes Historical Provider, MD  fluticasone (FLONASE) 50 MCG/ACT nasal spray Place 1 spray into both nostrils 2 (two) times daily.   Yes Historical Provider, MD  gluconic acid-citric acid (RENACIDIN) irrigation Irrigate with 30 mLs as directed 3 (three) times daily.   Yes Historical Provider, MD  guaiFENesin (MUCINEX) 600 MG 12 hr tablet Take 600 mg by mouth 2 (two) times daily.    Yes Historical Provider, MD  HYDROcodone-acetaminophen (NORCO/VICODIN) 5-325 MG per tablet Take 1-2 tablets by mouth every 6 (six) hours as needed for moderate pain.   Yes Historical Provider, MD  ipratropium-albuterol (DUONEB) 0.5-2.5 (3) MG/3ML SOLN Take 3 mLs by nebulization 4 (four) times daily as needed (for shortness of breath).   Yes Historical Provider, MD  ketoconazole (NIZORAL) 2 % shampoo Apply 1 application topically once a week. Pt uses on Friday.   Yes Historical Provider, MD  loratadine (CLARITIN) 10 MG tablet Take 10 mg by mouth daily.   Yes Historical Provider, MD  LORazepam (ATIVAN) 0.5 MG tablet Take 0.5 mg by mouth 2 (two) times daily.   Yes Historical Provider, MD  lovastatin (MEVACOR) 40 MG tablet Take 40 mg by mouth every evening.    Yes Historical Provider, MD  metoprolol tartrate (LOPRESSOR) 25 MG tablet Take 25 mg by mouth 2 (two) times daily.   Yes Historical Provider, MD  omeprazole (PRILOSEC) 40 MG capsule Take 40 mg by mouth daily.    Yes Historical Provider, MD  oxybutynin (DITROPAN) 5 MG tablet Take 5 mg by mouth 3 (three) times daily.   Yes Historical Provider, MD  polyethylene glycol (MIRALAX / GLYCOLAX) packet Take 17 g by mouth daily.   Yes Historical Provider, MD  QUEtiapine (SEROQUEL) 50 MG tablet Take 50 mg by mouth at bedtime.   Yes Historical Provider, MD   warfarin (COUMADIN) 5 MG tablet Take 2.5-5 mg by mouth daily. Pt alternates between 2.5 mg and 5 mg on an every other day basis.   Yes Historical Provider, MD  cefTAZidime 1 g in dextrose 5 % 50 mL Inject 1 g into the vein every 8 (eight) hours. Patient not taking: Reported on 03/22/2015 02/26/15   Gladstone Lighter, MD  clotrimazole-betamethasone (LOTRISONE) cream APPLY TO WOUND TWICE A DAY FOR 10 DAYS Patient not taking: Reported on 03/22/2015 03/11/15   Kent Norris, MD      VITAL SIGNS:  Blood pressure 112/80, pulse 56, temperature 95.9 F (35.5 C), temperature source Rectal, resp. rate 13, height 6' (1.829 m), weight 139.708 kg (308 lb), SpO2 95 %.  PHYSICAL EXAMINATION:  GENERAL:  49 y.o.-year-old patient lying in the bed with no acute distress.  EYES: Pupils equal, round, reactive to light and accommodation. No scleral icterus. Extraocular muscles intact.  HEENT: Head atraumatic, normocephalic. Oropharynx and nasopharynx clear.  NECK:  Supple, no jugular venous distention. No thyroid enlargement, no tenderness.  LUNGS: Normal breath sounds bilaterally, no wheezing, rales,rhonchi or crepitation. No use of accessory muscles of respiration.  CARDIOVASCULAR: S1, S2 normal. No murmurs, rubs, or gallops.  ABDOMEN: Soft, nontender, nondistended. Bowel sounds present. No organomegaly or mass.  EXTREMITIES: No pedal edema, cyanosis, or clubbing.  NEUROLOGIC: Cranial nerves II through XII are intact. Muscle strength 5/5 in all extremities. Sensation intact. Gait not checked.  PSYCHIATRIC: The patient is alert and oriented x 3.  SKIN: No obvious rash, lesion, or ulcer.   LABORATORY PANEL:   CBC  Recent Labs Lab 03/22/15 1532  WBC 8.5  HGB 11.4*  HCT 35.5*  PLT 246   ------------------------------------------------------------------------------------------------------------------  Chemistries   Recent Labs Lab 03/22/15 1532  NA 139  K 3.9  CL 102  CO2 31  GLUCOSE 93  BUN  11  CREATININE 0.39*  CALCIUM 9.2  AST 27  ALT 34  ALKPHOS 78  BILITOT 0.6   ------------------------------------------------------------------------------------------------------------------  Cardiac Enzymes  Recent Labs Lab 03/22/15 1532  TROPONINI <0.03   ------------------------------------------------------------------------------------------------------------------  RADIOLOGY:  Dg Chest Port 1 View  03/22/2015   CLINICAL DATA:  50 year old male with history of hypothermia. Recent chest congestion and urinary tract infections.  EXAM: PORTABLE CHEST - 1 VIEW  COMPARISON:  Chest x-ray 02/25/2015.  FINDINGS: Lung volumes are low. Bibasilar opacities are favored to be areas of subsegmental atelectasis. No definite acute consolidative airspace disease. No pleural effusions. No evidence of pulmonary edema. Heart size is upper limits of normal. Upper mediastinal contours are within normal limits. Left upper extremity PICC with tip terminating in the azygos vein.  IMPRESSION: 1. Tip of left upper extremity PICC is now directed posteriorly into the azygos vein. 2. Low lung volumes with bibasilar subsegmental atelectasis.   Electronically Signed   By: Vinnie Langton M.D.   On: 03/22/2015 16:30    EKG:   Orders placed or performed during the hospital encounter of 01/19/15  . ED EKG  . ED EKG  . EKG  EKG shows junctional rhythm 54 bpm  IMPRESSION AND PLAN:   #1 hypothermia likely secondary to his dementia rather than infection. Patient CBC is normal urine is not grossly infected. Dr.Cope ,primary   urologist follows up with him regularly. Patient had recent pseudomonal infection and has seen Dr. Ola Spurr for that. Finished recently [employer] for that. Hold off on and the biotics at this time as there is no evidence of infection. #2 history of chronic A. fib he is on Coumadin continue that but hold metoprolol secondary to bradycardia. #3 history of frontotemporal dementia continue  Celexa .l the records are reviewed and case discussed with ED provider. Management plans discussed with the patient, family and they are in agreement.  CODE STATUS: Full [facility] TAKING CARE OF THIS PATIENT 50 min minutes.    Epifanio Lesches M.D on 03/22/2015 at 6:38 PM  Between 7am to 6pm - Pager - [phone]  After 6pm go to [email or web address] - password [email or web address] [facility]  Office  [phone]  CC: Primary care physician; Kent Norris, MD

## 2015-03-22 NOTE — ED Notes (Addendum)
Ems from home for temp 94.5. 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 Per ems, finished antibiotics last week for uti.

## 2015-03-22 NOTE — ED Provider Notes (Addendum)
[facility] Emergency Department Provider Note   ____________________________________________  Time seen: On arrival I have reviewed the triage vital signs and the triage nursing note.  HISTORY  Chief Complaint Urinary Tract Infection   Historian Patient's father at the patient has frontal dementia  HPI Kent [name] is a 49 y.o. male with a history of frontotemporal dementia and neurogenic bladder with indwelling suprapubic catheter and a history of multiple drug resistant urinary tract infections who is brought in by his father who is his caregiver for hypothermia. Apparently the patient's father takes his temperature orally every day in the morning and today his temperature was around 95, went home health nurse came this afternoon it was 94. In the past he gets hypothermic when he is fighting a urinary tract infection. He was most recently on antibiotics in the hospital less than one month ago, and completed antibiotics 6 days ago. There've been no additional symptoms including no altered mental status, no coughing, no trouble breathing, no vomiting no diarrhea.    Past Medical History  Diagnosis Date  . Frontotemporal dementia   . Sleep apnea   . Hypertension   . Depression   . Neurogenic bladder   . Myelopathy   . Obesity   . Degeneration, intervertebral disc, cervical   . Elevated liver function tests   . Pulmonary embolus     on chronic anticoagulation  . Chronic UTI (urinary tract infection)   . Pernicious anemia   . Rosacea   . BPH (benign prostatic hypertrophy)   . History of DVT (deep vein thrombosis)   . History of pulmonary embolism     currently on coumadin  . Anxiety   . New onset a-fib 2013    Patient Active Problem List   Diagnosis Date Noted  . Cystitis 02/23/2015  . Pressure ulcer of buttock 02/03/2015  . Sepsis 01/30/2015  . Neurogenic bladder 01/30/2015  . Chronic suprapubic catheter 01/30/2015  .  Neurodegenerative disorder 01/30/2015  . GERD (gastroesophageal reflux disease) 01/30/2015  . UTI (lower urinary tract infection) 01/30/2015  . A-fib 08/21/2012  . Dementia 08/21/2012  . Hypertension 08/21/2012  . Hyperlipidemia 08/21/2012  . History of pulmonary embolism 08/21/2012    Past Surgical History  Procedure Laterality Date  . Suprapubic catheter insertion    . Kidney stone surgery      Current Outpatient Rx  Name  Route  Sig  Dispense  Refill  . benzonatate (TESSALON) 100 MG capsule      TAKE ONE CAPSULE BY MOUTH EVERY 8 HOURS AS NEEDED   30 capsule   0   . cefTAZidime 1 g in dextrose 5 % 50 mL   Intravenous   Inject 1 g into the vein every 8 (eight) hours.   54 g   0   . citalopram (CELEXA) 20 MG tablet   Oral   Take 20 mg by mouth 2 (two) times daily.         . clindamycin (CLEOCIN T) 1 % external solution   Topical   Apply 1 application topically 2 (two) times daily.      7   . clotrimazole-betamethasone (LOTRISONE) cream      APPLY TO WOUND TWICE A DAY FOR 10 DAYS   45 g   0   . Cyanocobalamin (VITAMIN B-12 IJ)   Injection   Inject 500 mLs as directed every 30 (thirty) days.          Marland Kitchen dextromethorphan (DELSYM)  30 MG/5ML liquid   Oral   Take 15 mg by mouth 2 (two) times daily.         . flecainide (TAMBOCOR) 50 MG tablet      TAKE 1 TABLET (50 MG TOTAL) BY MOUTH 2 (TWO) TIMES DAILY.   60 tablet   5   . fluticasone (FLONASE) 50 MCG/ACT nasal spray   Each Nare   Place 1 spray into both nostrils 2 (two) times daily.         Marland Kitchen guaiFENesin (MUCINEX) 600 MG 12 hr tablet   Oral   Take 600 mg by mouth 2 (two) times daily.          Marland Kitchen HYDROcodone-acetaminophen (NORCO/VICODIN) 5-325 MG per tablet   Oral   Take 1-2 tablets by mouth every 6 (six) hours as needed for moderate pain.         Marland Kitchen ipratropium-albuterol (DUONEB) 0.5-2.5 (3) MG/3ML SOLN      INHALE 1 VIAL 4 TIMES A DAY AS NEEDED   90 mL   1     DX Code Needed  .   .  ketoconazole (NIZORAL) 2 % shampoo   Topical   Apply 1 application topically once a week. on Fridays.         Marland Kitchen loratadine (CLARITIN) 10 MG tablet   Oral   Take 10 mg by mouth daily.         Marland Kitchen LORazepam (ATIVAN) 0.5 MG tablet   Oral   Take 0.5 mg by mouth 2 (two) times daily.         Marland Kitchen lovastatin (MEVACOR) 40 MG tablet   Oral   Take 40 mg by mouth every evening.          . metoprolol tartrate (LOPRESSOR) 25 MG tablet      TAKE 1 TABLET (25 MG TOTAL) BY MOUTH 2 (TWO) TIMES DAILY. Patient taking differently: TAKE 0.5 TABLET (25 MG TOTAL) BY MOUTH 2 (TWO) TIMES DAILY.   60 tablet   6   . NON FORMULARY      cpap daily at bedtime.         Marland Kitchen omeprazole (PRILOSEC) 40 MG capsule   Oral   Take 40 mg by mouth daily before breakfast.          . oxybutynin (DITROPAN) 5 MG tablet   Oral   Take 5 mg by mouth 3 (three) times daily.       6   . Polyethylene Glycol 3350 (MIRALAX PO)   Oral   Take 17 g by mouth every morning.          Marland Kitchen QUEtiapine (SEROQUEL) 50 MG tablet   Oral   Take 50 mg by mouth at bedtime.         Marland Kitchen RENACIDIN irrigation   Bladder Irrigation   30 mLs by Bladder Irrigation route 3 (three) times daily.      12     Dispense as written.   . warfarin (COUMADIN) 5 MG tablet   Oral   Take 2.5-5 mg by mouth every other day. Take 0.5 tab (2.5mg ) orally once a day then alternate with  1 tablet (5mg ) orally once a day.           Allergies Codeine; Vancomycin; and Zosyn  Family History  Problem Relation Age of Onset  . Hypertension Father   . Hyperlipidemia Father   . Hypertension Mother     Social History History  Substance Use Topics  .  Smoking status: Never Smoker   . Smokeless tobacco: Not on file  . Alcohol Use: No    Review of Systems Limited due to patient's baseline dementia Constitutional: Hypothermia Eyes: ENT:  Cardiovascular:. Respiratory: Negative for cough Gastrointestinal: Negative for abdominal pain,  vomiting and diarrhea. Genitourinary:  Musculoskeletal:  Skin: Negative for rash. Neurological:  10 point Review of Systems otherwise negative ____________________________________________   PHYSICAL EXAM:  VITAL SIGNS: ED Triage Vitals  Enc Vitals Group     BP 03/22/15 1455 119/91 mmHg     Pulse Rate 03/22/15 1455 56     Resp --      Temp 03/22/15 1455 95.1 F (35.1 C)     Temp Source 03/22/15 1455 Rectal     SpO2 03/22/15 1455 96 %     Weight 03/22/15 1455 308 lb (139.708 kg)     Height 03/22/15 1455 6' (1.829 m)     Head Cir --      Peak Flow --      Pain Score --      Pain Loc --      Pain Edu? --      Excl. in [place]? --      Constitutional: Alert with eyes open but nonverbal and is not following commands. In no distress. Eyes: Conjunctivae are normal. PERRL. Normal extraocular movements. ENT   Head: Normocephalic and atraumatic.   Nose: No congestion/rhinnorhea.   Mouth/Throat: Mucous membranes are moist.   Neck: No stridor. Cardiovascular/Chest: Normal rate, regular rhythm.  No murmurs, rubs, or gallops. Respiratory: Normal respiratory effort without tachypnea nor retractions. Breath sounds are clear and equal bilaterally. No wheezes/rales/rhonchi. Gastrointestinal: Soft. No distention, no guarding, no rebound. Nontender and obese. Suprapubic catheter in place with tiny amount of greenish discharge on the dressing, but no redness or fluctuance of the skin.  Genitourinary/rectal:Deferred Musculoskeletal: Nontender with normal range of motion in all extremities. No joint effusions.  No lower extremity tenderness nor edema. Neurologic:  Nonverbal. No gross or focal neurologic deficits are appreciated. Skin:  Skin is warm, dry and intact. No rash noted.   ____________________________________________   EKG I, Lisa Roca, MD, the attending physician have personally viewed and interpreted all ECGs.  54 bpm. Normal sinus rhythm. Normal axis. Nonspecific  intraventricular conduction delay. Nonspecific T wave. ____________________________________________  LABS (pertinent positives/negatives)  White blood count 8.5, hemoglobin 11.4 Urinalysis negative nitrites, 3+ leukocytes, 6-30 red blood cells, too numerous to count white blood cells, no bacteria Medical panel without significant abnormality Troponin less than 0.03  ____________________________________________  RADIOLOGY All Xrays were viewed by me. Imaging interpreted by Radiologist.  Chest x-ray: No pneumonia __________________________________________  PROCEDURES  Procedure(s) performed: None Critical Care performed: None  ____________________________________________   ED COURSE / ASSESSMENT AND PLAN  CONSULTATIONS: Face-to-face with hospitalist for admission, Dr. Vianne Bulls  Pertinent labs & imaging results that were available during my care of the patient were reviewed by me and considered in my medical decision making (see chart for details).  There is some concern for depression be septic with a low body temperature as this typically happens when his family having a urinary tract infection. He is overall well-appearing with no tachycardia or hypotension. I will send blood cultures, urine culture, urinalysis, blood work, lactate, and also check a chest x-ray as another potential source of infection.  Urinalysis positive for white blood cells and leukocytes with negative nitrites and negative bacteria. A culture was sent.  In review of the whole clinical scenario, I am  concerned that the hypothermia with the white blood cells in the urine may be a presentation of sepsis for this patient who has had similar presentations like this in the past. Because of his dementia, hypothermia is often the first and only symptom to tip off for the sepsis. He has no other source more likely than a urinary source. In review of his prior cultures he's had multiple drug-resistant Pseudomonas  infection. Per Dr. Ola Spurr infectious disease note, he was previously started on Ceftazidime again, and I will go ahead and cover him with this today as well. Although his urinalysis does not show bacteria, he does have leukocytes and white blood cells. His white blood cell count is normal, however he has had a normal white blood cell count the past and had sepsis due to urinary tract infection.  I will admit the patient for treatment of the hypothermia, and treatment of likely urinary source of infection.  ----------------------------------------- 6:03 PM on 03/22/2015 ----------------------------------------- After discussion with the family who stated that at Dr. copes appointment on Friday Dr. Burt Knack had suggested that perhaps if this presentation occurs again to it without antibiotics for 24 hours, so long as there is no other evidence of sepsis given the hypo-bulimia and may be temperature dysregulation due to his underlying neurologic condition.  I was able to call and speak with Dr. Elenor Quinones, partner of Dr. Jacqlyn Larsen his urologist, who echoed the same plan that was reported to him by Dr.Cope.  I have discussed this plan with Dr. Vianne Bulls, the hospitalist is going admit the patient. Since there are no additional signs of sepsis including a normal lactate, normal white blood cell count and his bloodstream, no vital sign abnormalities of heart rate and blood pressure, patient will be observed without antibiotics. If patient shows any other instability, and a lot of can be added for the possibly of sepsis.    Patient / Family / Caregiver informed of clinical course, medical decision-making process, and agree with plan.   I discussed return precautions, follow-up instructions, and discharged instructions with patient and/or family.  ___________________________________________   FINAL CLINICAL IMPRESSION(S) / ED DIAGNOSES   Final diagnoses:  Hypothermia, initial encounter       Lisa Roca, MD 03/22/15 1732  Lisa Roca, MD 03/22/15 [phone]

## 2015-03-23 DIAGNOSIS — R680 Hypothermia, not associated with low environmental temperature: Secondary | ICD-10-CM | POA: Diagnosis not present

## 2015-03-23 LAB — BASIC METABOLIC PANEL
Anion gap: 7 (ref 5–15)
BUN: 9 mg/dL (ref 6–20)
CHLORIDE: 101 mmol/L (ref 101–111)
CO2: 31 mmol/L (ref 22–32)
CREATININE: 0.41 mg/dL — AB (ref 0.61–1.24)
Calcium: 9.1 mg/dL (ref 8.9–10.3)
Glucose, Bld: 95 mg/dL (ref 65–99)
POTASSIUM: 4 mmol/L (ref 3.5–5.1)
SODIUM: 139 mmol/L (ref 135–145)

## 2015-03-23 LAB — CBC
HCT: 35.8 % — ABNORMAL LOW (ref 40.0–52.0)
HEMOGLOBIN: 11.6 g/dL — AB (ref 13.0–18.0)
MCH: 28.2 pg (ref 26.0–34.0)
MCHC: 32.4 g/dL (ref 32.0–36.0)
MCV: 87.2 fL (ref 80.0–100.0)
Platelets: 247 10*3/uL (ref 150–440)
RBC: 4.1 MIL/uL — ABNORMAL LOW (ref 4.40–5.90)
RDW: 18.5 % — AB (ref 11.5–14.5)
WBC: 9.7 10*3/uL (ref 3.8–10.6)

## 2015-03-23 LAB — PROTIME-INR
INR: 2.09
INR: 1.96
Prothrombin Time: 23.6 seconds — ABNORMAL HIGH (ref 11.4–15.0)
Prothrombin Time: 22.5 seconds — ABNORMAL HIGH (ref 11.4–15.0)

## 2015-03-23 MED ORDER — OLOPATADINE HCL 0.1 % OP SOLN
1.0000 [drp] | Freq: Two times a day (BID) | OPHTHALMIC | Status: DC
  Administered 2015-03-23 – 2015-03-24 (×3): 1 [drp] via OPHTHALMIC
  Filled 2015-03-23: qty 5

## 2015-03-23 MED ORDER — DIPHENHYDRAMINE HCL 25 MG PO CAPS
25.0000 mg | ORAL_CAPSULE | Freq: Three times a day (TID) | ORAL | Status: DC | PRN
  Administered 2015-03-23: 25 mg via ORAL
  Filled 2015-03-23: qty 1

## 2015-03-23 NOTE — Progress Notes (Signed)
Pt with low b/p running in the  90's over 859'M systolic and 93'J diastolic. Pt on Metoprolol bid. Notified Dr Jannifer Franklin via telephone. Order to hold tonight's dose.Jeffie Pollock, RN

## 2015-03-23 NOTE — Plan of Care (Signed)
Problem: Discharge Progression Outcomes Goal: Other Discharge Outcomes/Goals Outcome: Progressing Diet dysphagia 1  tol well.  Little increase in congestion. Sit straight up to eat.  eys gtts ordered for eye irritation.  Pt to have left lower ext doppler d/t swelling left leg/ foot.possible discharge home tomorrow.

## 2015-03-23 NOTE — Plan of Care (Signed)
Problem: Discharge Progression Outcomes Goal: Discharge plan in place and appropriate Individualization Outcome: Progressing Pt likes to be called Kent [name].  Severe asphasia.   Pt is totally dependent on others.  Lives at home with parents.  Mother still works.  Father is major caregiver and uses a walker to get around. Pt loves Elvis and reacts to song or references to him.     Goal: Other Discharge Outcomes/Goals Outcome: Progressing Plan of care progress to goal: Pain - pt does not appear to be in pain Hemodynamically stable - pt vitals stable this shift Complications - pt here for observation Diet - tolerates a dysphagia diet Activity - pt is bedbound and is total care

## 2015-03-23 NOTE — Progress Notes (Signed)
Dr Manuella Ghazi notified of swelling of left leg and foot and greenish discoloration

## 2015-03-23 NOTE — Progress Notes (Signed)
PT Cancellation Note  Patient Details Name: Kent [name] MRN: [id number] DOB: 1966/04/14   Cancelled Treatment:    Reason Eval/Treat Not Completed: PT screened, no needs identified, will sign off. Per the caregiver present, patient is totally dependent at home. He has an indwelling suprapubic catheter and a Hoyer lift to transfer to the commode when necessary. Per caregiver she has been doing leg exercises with him to reduce edema. Patient has a long history of physical dependency and dementia and would not be able to participate towards and skilled mobility goals at this time. Patient appears to be at his baseline from a mobility perspective per his caregiver. PT will sign off at this time as the family stated they had no additional equipment needs. They did ask for Salvatore Decent, which was relayed to RN staff.   Kerman Passey, PT, DPT    03/23/2015, 10:26 AM

## 2015-03-23 NOTE — Progress Notes (Signed)
Initial Nutrition Assessment  INTERVENTION:  Meals and Snacks: Spoke with MD Manuella Ghazi regarding diet order as speaking with father and caregiver on visit. Pt on last admission was on a Dysphagia I, Nectar thick diet order last month. Pt caregiver and father reports pt has liquids thickened at home PTA. On last admission RD spoke with pt mother regarding ways to puree foods to which caregiver reports pt was doing at home PTA.  MD Manuella Ghazi agreeable to modifying diet order to previous Dysphagia I, Nectar thick liquids at this time and if change in status or in dysphagia will consult SLP for further evaluation.  NUTRITION DIAGNOSIS:  Swallowing difficulty related to dysphagia as evidenced by per patient/family report.  GOAL:  Patient will meet greater than or equal to 90% of their needs  MONITOR:   (Energy Intake, Digestive System, Electrolyte and renal Profile)  REASON FOR ASSESSMENT:  Low Braden (Thickened Liquids)    ASSESSMENT:  Pt admitted with hyperthermia with bladder infection.   PMHx:  Past Medical History  Diagnosis Date  . Frontotemporal dementia   . Sleep apnea   . Hypertension   . Depression   . Neurogenic bladder   . Myelopathy   . Obesity   . Degeneration, intervertebral disc, cervical   . Elevated liver function tests   . Pulmonary embolus     on chronic anticoagulation  . Chronic UTI (urinary tract infection)   . Pernicious anemia   . Rosacea   . BPH (benign prostatic hypertrophy)   . History of DVT (deep vein thrombosis)   . History of pulmonary embolism     currently on coumadin  . Anxiety   . New onset a-fib 2013    Diet Order:  DIET - DYS 1 Room service appropriate?: Yes; Fluid consistency:: Nectar Thick  Current Nutrition: Pt ate 100% of breakfast this am. Per caregiver ground eggs were sent and she mashed them down further for ease of chewing.   Food/Nutrition-Related History: Pt caregiver reports pt has had a good appetite PTA and that pt is  tolerating pureed foods at home 'much better' since last admission.    Medications: Protonix, Miralax, coumadin  Electrolyte/Renal Profile and Glucose Profile:   Recent Labs Lab 03/22/15 1532 03/23/15 0604  NA 139 139  K 3.9 4.0  CL 102 101  CO2 31 31  BUN 11 9  CREATININE 0.39* 0.41*  CALCIUM 9.2 9.1  GLUCOSE 93 95   Protein Profile:  Recent Labs Lab 03/22/15 1532  ALBUMIN 3.6    Gastrointestinal Profile: Last BM: 03/22/2015   Weight Change: Per MST not decrease in weight PTA. Per [facility] weight loss of 2% in the past month. Pt weight 280-292lbs in the past year Anthropometrics:   Skin:  Reviewed, no issues  Height:  Ht Readings from Last 1 Encounters:  03/22/15 6' (1.829 m)    Weight:  Wt Readings from Last 1 Encounters:  03/22/15 285 lb 11.2 oz (129.593 kg)    Ideal Body Weight:  75.5 kg  Wt Readings from Last 10 Encounters:  03/22/15 285 lb 11.2 oz (129.593 kg)  02/24/15 292 lb 6.4 oz (132.632 kg)  01/31/15 287 lb (130.182 kg)  01/19/15 290 lb (131.543 kg)  08/18/14 280 lb (127.007 kg)  10/16/13 285 lb (129.275 kg)    BMI:  Body mass index is 38.74 kg/(m^2).   EDUCATION NEEDS:  Education needs no appropriate at this time  [place], [place], LDN Pager [phone]

## 2015-03-23 NOTE — Progress Notes (Signed)
Pt developed redness to face. Pt also has worsening cough.  Family verbalized concern. Notified Dr Posey Pronto via telephone. Md declined order for CXR. CXR yesterday showed some atelectasis. MD asked for recent oxygen saturations. O2 sats on room air were 97% around 1340.Order given telephone readback for Benadryl 25 mg po q 8 hrs prn. Will continue to monitor.Jeffie Pollock, RN

## 2015-03-23 NOTE — Care Management (Signed)
Admitted to [facility] with the diagnosis of hypothermia. Lives with parents. Father is Devere [phone])  Father is caregiver. Mother is Joaquim Lai and she still works. Followed by [place]. Uses a walker to aid in ambulation, but is basically bedbound.  Supra Pubic cath in place. Sees Dr. Rutherford Nail as primary care physician. Temperature range for last 24 hours - 95.1-98.  Shelbie Ammons RN MSN Care Management [phone]

## 2015-03-23 NOTE — Progress Notes (Signed)
[place] [facility] [place]    MR#:  [id number]  DATE OF BIRTH:  [birth year]  SUBJECTIVE:  CHIEF COMPLAINT:   Chief Complaint  Patient presents with  . Urinary Tract Infection  Unable to communicate due to baseline mental status - Fronto-temporal demetia REVIEW OF SYSTEMS:  Review of Systems  Unable to perform ROS: dementia   DRUG ALLERGIES:   Allergies  Allergen Reactions  . Codeine Nausea Only  . Vancomycin Other (See Comments)    Reaction:  Red man syndrome   . Zosyn [Piperacillin Sod-Tazobactam So] Rash and Other (See Comments)    Pt experienced arm erythema on administration, but tolerated cephalosporins fine.     VITALS:  Blood pressure 137/79, pulse 62, temperature 98 F (36.7 C), temperature source Oral, resp. rate 20, height 6' (1.829 m), weight 129.593 kg (285 lb 11.2 oz), SpO2 95 %. PHYSICAL EXAMINATION:  Physical Exam  Constitutional: He is well-developed, well-nourished, and in no distress.  HENT:  Head: Normocephalic and atraumatic.  Eyes: Conjunctivae and EOM are normal. Pupils are equal, round, and reactive to light.  Neck: Normal range of motion. Neck supple. No tracheal deviation present. No thyromegaly present.  Cardiovascular: Normal rate, regular rhythm and normal heart sounds.   Pulmonary/Chest: Effort normal and breath sounds normal. No respiratory distress. He has no wheezes. He exhibits no tenderness.  Abdominal: Soft. Bowel sounds are normal. He exhibits no distension. There is no tenderness.  Musculoskeletal: Normal range of motion.  Neurological: He is alert. He has normal strength. No cranial nerve deficit.  Difficult evaluation due to dementia  Skin: Skin is warm and dry. No rash noted.  Psychiatric:  Mental retardation faces - Difficult evaluation due to dementia   LABORATORY PANEL:   CBC  Recent Labs Lab 03/23/15 0604  WBC 9.7  HGB 11.6*  HCT 35.8*  PLT 247    ------------------------------------------------------------------------------------------------------------------ Chemistries   Recent Labs Lab 03/22/15 1532 03/23/15 0604  NA 139 139  K 3.9 4.0  CL 102 101  CO2 31 31  GLUCOSE 93 95  BUN 11 9  CREATININE 0.39* 0.41*  CALCIUM 9.2 9.1  AST 27  --   ALT 34  --   ALKPHOS 78  --   BILITOT 0.6  --    RADIOLOGY:  Dg Chest Port 1 View  03/22/2015   CLINICAL DATA:  49 year old male with history of hypothermia. Recent chest congestion and urinary tract infections.  EXAM: PORTABLE CHEST - 1 VIEW  COMPARISON:  Chest x-ray 02/25/2015.  FINDINGS: Lung volumes are low. Bibasilar opacities are favored to be areas of subsegmental atelectasis. No definite acute consolidative airspace disease. No pleural effusions. No evidence of pulmonary edema. Heart size is upper limits of normal. Upper mediastinal contours are within normal limits. Left upper extremity PICC with tip terminating in the azygos vein.  IMPRESSION: 1. Tip of left upper extremity PICC is now directed posteriorly into the azygos vein. 2. Low lung volumes with bibasilar subsegmental atelectasis.   Electronically Signed   By: Vinnie Langton M.D.   On: 03/22/2015 16:30   ASSESSMENT AND PLAN:   #1 hypothermia: could be due to sepsis but doubt it, family is very concerned due to recurrent uti and rather wait for urine c/s to make sure. Temp is normal now. #2 history of chronic A. fib he is on Coumadin continue that but hold metoprolol secondary to bradycardia. inr 2.0 #3 history of frontotemporal dementia  continue Celexa #4 h/o pernicious anemia: stable   All the records are reviewed and case discussed with Care Management/Social Worker. Management plans discussed with the patient, family and they are in agreement.  CODE STATUS: DNR  TOTAL TIME TAKING CARE OF THIS PATIENT: 35 minutes.   More than 50% of the time was spent in counseling/coordination of care: YES  POSSIBLE D/C IN  1 DAYS, DEPENDING ON CLINICAL CONDITION. If temp remains ok and urine c/s neg   [facility], Arayla Kruschke M.D on 03/23/2015 at 11:04 AM  Between 7am to 6pm - Pager - [phone]  After 6pm go to [email or web address] - password [email or web address] [facility]  Office  [phone]  CC:  Primary care physician; Ashok Norris, MD

## 2015-03-24 ENCOUNTER — Observation Stay: Payer: Medicare Other

## 2015-03-24 DIAGNOSIS — R680 Hypothermia, not associated with low environmental temperature: Secondary | ICD-10-CM | POA: Diagnosis not present

## 2015-03-24 LAB — PROTIME-INR
INR: 1.67
PROTHROMBIN TIME: 19.9 s — AB (ref 11.4–15.0)

## 2015-03-24 MED ORDER — WARFARIN SODIUM 5 MG PO TABS: 2.5000 mg | ORAL_TABLET | Freq: Every day | ORAL | Status: DC

## 2015-03-24 NOTE — Plan of Care (Signed)
Problem: Discharge Progression Outcomes Goal: Discharge plan in place and appropriate Individualization Outcome: Progressing Pt likes to be called [place].  Severe asphasia.    Pt is totally dependent on others.  Lives at home with parents.  Mother still works.  Father is major caregiver and uses a walker to get around. Pt loves Elvis and reacts to song or references to him.    Goal: Other Discharge Outcomes/Goals Outcome: Not Applicable Date Met:  19/94/12 Pt with no s/sx of pain or discomfort. Total care.Pt at baseline as far as activity level. B/p low. MD gave instruction to hold Metoprolol dose lase night. Tylenol given once for fever with improvement. Face became reddend as well as patient upper limbs and trunk. MD notified, Benadryl given once with improvement. Father at bedside all night. Suprapubic cath irrigated per order, no resistance met. Tolerated CPAP all night. Pt for US doppler of LLE today.

## 2015-03-24 NOTE — Progress Notes (Signed)
See above note

## 2015-03-24 NOTE — Discharge Instructions (Signed)
Pt to discharge home via ems  To care of parents.  No distress. tol diet. Discharge instructions to  Father.  Sp cather intact/ picc left upper arm intact.flushed well.

## 2015-03-24 NOTE — Discharge Summary (Addendum)
[facility] NAME: Kent [name]    MR#:  [id number]  DATE OF BIRTH:  Apr 15, 1966  DATE OF ADMISSION:  03/22/2015 ADMITTING PHYSICIAN: Kent Lesches, MD  DATE OF DISCHARGE: 03/24/2015  PRIMARY CARE PHYSICIAN: Kent Norris, MD    ADMISSION DIAGNOSIS:  Hypothermia, initial encounter [T68.XXXA]  DISCHARGE DIAGNOSIS:  Hypothermia-resolved  SECONDARY DIAGNOSIS:   Past Medical History  Diagnosis Date  . Frontotemporal dementia   . Sleep apnea   . Hypertension   . Depression   . Neurogenic bladder   . Myelopathy   . Obesity   . Degeneration, intervertebral disc, cervical   . Elevated liver function tests   . Pulmonary embolus     on chronic anticoagulation  . Chronic UTI (urinary tract infection)   . Pernicious anemia   . Rosacea   . BPH (benign prostatic hypertrophy)   . History of DVT (deep vein thrombosis)   . History of pulmonary embolism     currently on coumadin  . Anxiety   . New onset a-fib 2013    HOSPITAL COURSE:  Kent [name] is a 49 y.o. male with a known history of fronto temporal dementia, neurogenic bladder with indwelling suprapubic catheter, recent multidrug-resistant UTI with Pseudomonas brought in by family secondary to hypothermia  #1 hypothermia resolved -no evidence of  sepsis but emp is normal now. -BC and UC so far negative -pt back to baseline eating well. no indication for antibiotics  #2 history of chronic A. fib he is on Coumadin -resumed cardiac meds. HR stable in the 70's  #3 history of frontotemporal dementia continue Celexa  #4 h/o pernicious anemia: stable  #5 Bilateral LE swelling no DVT. Likely due to dependent edema  Overall appears at baseline. Will d/c home  DISCHARGE CONDITIONS:   fair  CONSULTS OBTAINED:   none  DRUG ALLERGIES:   Allergies  Allergen Reactions  . Codeine Nausea Only  . Vancomycin Other (See Comments)    Reaction:  Red man  syndrome   . Zosyn [Piperacillin Sod-Tazobactam So] Rash and Other (See Comments)    Pt experienced arm erythema on administration, but tolerated cephalosporins fine.      DISCHARGE MEDICATIONS:   Current Discharge Medication List    CONTINUE these medications which have CHANGED   Details  warfarin (COUMADIN) 5 MG tablet Take 0.5-1 tablets (2.5-5 mg total) by mouth daily. Pt alternates between 2.5 mg and 5 mg on an every other day basis. Qty: 30 tablet, Refills: 0      CONTINUE these medications which have NOT CHANGED   Details  benzonatate (TESSALON) 100 MG capsule Take 100 mg by mouth every 8 (eight) hours as needed for cough.    citalopram (CELEXA) 20 MG tablet Take 20 mg by mouth 2 (two) times daily.    clindamycin (CLEOCIN T) 1 % external solution Apply 1 application topically 2 (two) times daily.     cyanocobalamin (,VITAMIN B-12,) 1000 MCG/ML injection Inject 500 mcg into the muscle every 30 (thirty) days.    dextromethorphan (DELSYM) 30 MG/5ML liquid Take 15 mg by mouth 2 (two) times daily as needed for cough.     flecainide (TAMBOCOR) 50 MG tablet Take 50 mg by mouth 2 (two) times daily.    fluticasone (FLONASE) 50 MCG/ACT nasal spray Place 1 spray into both nostrils 2 (two) times daily.    gluconic acid-citric acid (RENACIDIN) irrigation Irrigate with 30 mLs as directed 3 (three) times daily.  guaiFENesin (MUCINEX) 600 MG 12 hr tablet Take 600 mg by mouth 2 (two) times daily.     HYDROcodone-acetaminophen (NORCO/VICODIN) 5-325 MG per tablet Take 1-2 tablets by mouth every 6 (six) hours as needed for moderate pain.    ipratropium-albuterol (DUONEB) 0.5-2.5 (3) MG/3ML SOLN Take 3 mLs by nebulization 4 (four) times daily as needed (for shortness of breath).    ketoconazole (NIZORAL) 2 % shampoo Apply 1 application topically once a week. Pt uses on Friday.    loratadine (CLARITIN) 10 MG tablet Take 10 mg by mouth daily.    LORazepam (ATIVAN) 0.5 MG tablet Take 0.5  mg by mouth 2 (two) times daily.    lovastatin (MEVACOR) 40 MG tablet Take 40 mg by mouth every evening.     metoprolol tartrate (LOPRESSOR) 25 MG tablet Take 25 mg by mouth 2 (two) times daily.    omeprazole (PRILOSEC) 40 MG capsule Take 40 mg by mouth daily.     oxybutynin (DITROPAN) 5 MG tablet Take 5 mg by mouth 3 (three) times daily.    polyethylene glycol (MIRALAX / GLYCOLAX) packet Take 17 g by mouth daily.    QUEtiapine (SEROQUEL) 50 MG tablet Take 50 mg by mouth at bedtime.    clotrimazole-betamethasone (LOTRISONE) cream APPLY TO WOUND TWICE A DAY FOR 10 DAYS Qty: 45 g, Refills: 0      STOP taking these medications     cefTAZidime 1 g in dextrose 5 % 50 mL         If you experience worsening of your admission symptoms, develop shortness of breath, life threatening emergency, suicidal or homicidal thoughts you must seek medical attention immediately by calling 911 or calling your MD immediately  if symptoms less severe.  You Must read complete instructions/literature along with all the possible adverse reactions/side effects for all the Medicines you take and that have been prescribed to you. Take any new Medicines after you have completely understood and accept all the possible adverse reactions/side effects.   Please note  You were cared for by a hospitalist during your hospital stay. If you have any questions about your discharge medications or the care you received while you were in the hospital after you are discharged, you can call the unit and asked to speak with the hospitalist on call if the hospitalist that took care of you is not available. Once you are discharged, your primary care physician will handle any further medical issues. Please note that NO REFILLS for any discharge medications will be authorized once you are discharged, as it is imperative that you return to your primary care physician (or establish a relationship with a primary care physician if you do  not have one) for your aftercare needs so that they can reassess your need for medications and monitor your lab values. Today   SUBJECTIVE   Non verbal, non communicative. Father at bedside feels pt is doing well  VITAL SIGNS:  Blood pressure 104/58, pulse 70, temperature 98.8 F (37.1 C), temperature source Oral, resp. rate 20, height 6' (1.829 m), weight 129.593 kg (285 lb 11.2 oz), SpO2 96 %.  I/O:   Intake/Output Summary (Last 24 hours) at 03/24/15 1051 Last data filed at 03/24/15 0800  Gross per 24 hour  Intake    440 ml  Output   1200 ml  Net   -760 ml    PHYSICAL EXAMINATION:  GENERAL:  49 y.o.-year-old patient lying in the bed with no acute distress. obese EYES: Pupils  equal, round, reactive to light and accommodation. No scleral icterus. Extraocular muscles intact.  HEENT: Head atraumatic, normocephalic. Oropharynx and nasopharynx clear.  NECK:  Supple, no jugular venous distention. No thyroid enlargement, no tenderness.  LUNGS: decreased breath sounds bilaterally, no wheezing, rales,rhonchi or crepitation. No use of accessory muscles of respiration.  CARDIOVASCULAR: S1, S2 normal. No murmurs, rubs, or gallops.  ABDOMEN: Soft, non-tender, non-distended. Bowel sounds present. No organomegaly or mass.  EXTREMITIES: No pedal edema, cyanosis, or clubbing.  NEUROLOGIC: unable to assess due to MR and dementia PSYCHIATRIC: pt hasdementia SKIN: No obvious rash, lesion, or ulcer.   DATA REVIEW:   CBC   Recent Labs Lab 03/23/15 0604  WBC 9.7  HGB 11.6*  HCT 35.8*  PLT 247    Chemistries   Recent Labs Lab 03/22/15 1532 03/23/15 0604  NA 139 139  K 3.9 4.0  CL 102 101  CO2 31 31  GLUCOSE 93 95  BUN 11 9  CREATININE 0.39* 0.41*  CALCIUM 9.2 9.1  AST 27  --   ALT 34  --   ALKPHOS 78  --   BILITOT 0.6  --     Microbiology Results   Recent Results (from the past 240 hour(s))  Culture, blood (routine x 2)     Status: None (Preliminary result)    Collection Time: 03/22/15  3:23 PM  Result Value Ref Range Status   Specimen Description BLOOD L PIC LINE  Final   Special Requests BOTTLES DRAWN AEROBIC AND ANAEROBIC 5ML  Final   Culture NO GROWTH 2 DAYS  Final   Report Status PENDING  Incomplete  Culture, blood (routine x 2)     Status: None (Preliminary result)   Collection Time: 03/22/15  3:23 PM  Result Value Ref Range Status   Specimen Description BLOOD RF  Final   Special Requests BOTTLES DRAWN AEROBIC AND ANAEROBIC 5ML  Final   Culture NO GROWTH 2 DAYS  Final   Report Status PENDING  Incomplete  Urine culture     Status: None (Preliminary result)   Collection Time: 03/22/15  3:28 PM  Result Value Ref Range Status   Specimen Description URINE, RANDOM  Final   Special Requests NONE  Final   Culture NO GROWTH <24 HRS  Final   Report Status PENDING  Incomplete    RADIOLOGY:  US Venous Img Lower Unilateral Left  03/24/2015   CLINICAL DATA:  Swelling.  EXAM: LEFT LOWER EXTREMITY VENOUS DOPPLER ULTRASOUND  TECHNIQUE: Gray-scale sonography with graded compression, as well as color Doppler and duplex ultrasound were performed to evaluate the lower extremity deep venous systems from the level of the common femoral vein and including the common femoral, femoral, profunda femoral, popliteal and calf veins including the posterior tibial, peroneal and gastrocnemius veins when visible. The superficial great saphenous vein was also interrogated. Spectral Doppler was utilized to evaluate flow at rest and with distal augmentation maneuvers in the common femoral, femoral and popliteal veins.  COMPARISON:  None.  FINDINGS: Contralateral Common Femoral Vein: Respiratory phasicity is normal and symmetric with the symptomatic side. No evidence of thrombus. Normal compressibility.  Common Femoral Vein: No evidence of thrombus. Normal compressibility, respiratory phasicity and response to augmentation.  Saphenofemoral Junction: No evidence of thrombus.  Normal compressibility and flow on color Doppler imaging.  Profunda Femoral Vein: No evidence of thrombus. Normal compressibility and flow on color Doppler imaging.  Femoral Vein: No evidence of thrombus. Normal compressibility, respiratory phasicity and response to augmentation.  Popliteal Vein: No evidence of thrombus. Normal compressibility, respiratory phasicity and response to augmentation.  Calf Veins: Limited visualization due to body habitus and soft tissue edema. Color flow is noted in the calf veins.  Superficial Great Saphenous Vein: No evidence of thrombus. Normal compressibility and flow on color Doppler imaging.  Venous Reflux:  None.  Other Findings:  None.  IMPRESSION: No evidence of deep venous thrombosis.   Electronically Signed   By: Marcello Moores  Register   On: 03/24/2015 10:23   Dg Chest Port 1 View  03/22/2015   CLINICAL DATA:  49 year old male with history of hypothermia. Recent chest congestion and urinary tract infections.  EXAM: PORTABLE CHEST - 1 VIEW  COMPARISON:  Chest x-ray 02/25/2015.  FINDINGS: Lung volumes are low. Bibasilar opacities are favored to be areas of subsegmental atelectasis. No definite acute consolidative airspace disease. No pleural effusions. No evidence of pulmonary edema. Heart size is upper limits of normal. Upper mediastinal contours are within normal limits. Left upper extremity PICC with tip terminating in the azygos vein.  IMPRESSION: 1. Tip of left upper extremity PICC is now directed posteriorly into the azygos vein. 2. Low lung volumes with bibasilar subsegmental atelectasis.   Electronically Signed   By: Vinnie Langton M.D.   On: 03/22/2015 16:30   Management plans discussed with the  family and they are in agreement.  CODE STATUS:     Code Status Orders        Start     Ordered   03/22/15 2233  Do not attempt resuscitation (DNR)   Continuous    Question Answer Comment  In the event of cardiac or respiratory ARREST Do not call a "code blue"    In the event of cardiac or respiratory ARREST Do not perform Intubation, CPR, defibrillation or ACLS   In the event of cardiac or respiratory ARREST Use medication by any route, position, wound care, and other measures to relive pain and suffering. May use oxygen, suction and manual treatment of airway obstruction as needed for comfort.      03/22/15 2232    Advance Directive Documentation        Most Recent Value   Type of Advance Directive  Healthcare Power of Attorney, Living will   Pre-existing out of facility DNR order (yellow form or pink MOST form)     "MOST" Form in Place?        TOTAL TIME TAKING CARE OF THIS PATIENT: 40 minutes.    Julea Hutto M.D on 03/24/2015 at 10:51 AM  Between 7am to 6pm - Pager - [phone] After 6pm go to [email or web address] - password [email or web address]  Office  [phone]  CC: Primary care physician; Kent Norris, MD

## 2015-03-25 ENCOUNTER — Encounter: Payer: Self-pay | Admitting: Family Medicine

## 2015-03-25 ENCOUNTER — Ambulatory Visit (INDEPENDENT_AMBULATORY_CARE_PROVIDER_SITE_OTHER): Payer: Medicare Other | Admitting: Family Medicine

## 2015-03-25 VITALS — BP 118/72 | HR 63 | Temp 97.4°F | Resp 16

## 2015-03-25 DIAGNOSIS — N390 Urinary tract infection, site not specified: Secondary | ICD-10-CM

## 2015-03-25 DIAGNOSIS — F0390 Unspecified dementia without behavioral disturbance: Secondary | ICD-10-CM | POA: Diagnosis not present

## 2015-03-25 DIAGNOSIS — G319 Degenerative disease of nervous system, unspecified: Secondary | ICD-10-CM

## 2015-03-25 DIAGNOSIS — Z86711 Personal history of pulmonary embolism: Secondary | ICD-10-CM | POA: Diagnosis not present

## 2015-03-25 DIAGNOSIS — T68XXXA Hypothermia, initial encounter: Secondary | ICD-10-CM

## 2015-03-25 DIAGNOSIS — G834 Cauda equina syndrome: Secondary | ICD-10-CM | POA: Diagnosis not present

## 2015-03-25 DIAGNOSIS — Z8619 Personal history of other infectious and parasitic diseases: Secondary | ICD-10-CM

## 2015-03-25 DIAGNOSIS — Z9350 Unspecified cystostomy status: Secondary | ICD-10-CM | POA: Diagnosis not present

## 2015-03-25 DIAGNOSIS — I4891 Unspecified atrial fibrillation: Secondary | ICD-10-CM | POA: Insufficient documentation

## 2015-03-25 DIAGNOSIS — L89302 Pressure ulcer of unspecified buttock, stage 2: Secondary | ICD-10-CM | POA: Diagnosis not present

## 2015-03-25 DIAGNOSIS — N200 Calculus of kidney: Secondary | ICD-10-CM

## 2015-03-25 DIAGNOSIS — I10 Essential (primary) hypertension: Secondary | ICD-10-CM

## 2015-03-25 DIAGNOSIS — N319 Neuromuscular dysfunction of bladder, unspecified: Secondary | ICD-10-CM

## 2015-03-25 DIAGNOSIS — T839XXD Unspecified complication of genitourinary prosthetic device, implant and graft, subsequent encounter: Secondary | ICD-10-CM

## 2015-03-25 DIAGNOSIS — IMO0001 Reserved for inherently not codable concepts without codable children: Secondary | ICD-10-CM

## 2015-03-25 DIAGNOSIS — K219 Gastro-esophageal reflux disease without esophagitis: Secondary | ICD-10-CM

## 2015-03-25 DIAGNOSIS — N3281 Overactive bladder: Secondary | ICD-10-CM

## 2015-03-25 DIAGNOSIS — E785 Hyperlipidemia, unspecified: Secondary | ICD-10-CM

## 2015-03-25 DIAGNOSIS — I480 Paroxysmal atrial fibrillation: Secondary | ICD-10-CM

## 2015-03-25 DIAGNOSIS — R6510 Systemic inflammatory response syndrome (SIRS) of non-infectious origin without acute organ dysfunction: Secondary | ICD-10-CM | POA: Insufficient documentation

## 2015-03-25 DIAGNOSIS — R3129 Other microscopic hematuria: Secondary | ICD-10-CM

## 2015-03-25 DIAGNOSIS — R312 Other microscopic hematuria: Secondary | ICD-10-CM

## 2015-03-25 DIAGNOSIS — Z9359 Other cystostomy status: Secondary | ICD-10-CM

## 2015-03-25 DIAGNOSIS — T68XXXD Hypothermia, subsequent encounter: Secondary | ICD-10-CM

## 2015-03-25 NOTE — Patient Instructions (Signed)
20 minute discussion with father regarding Lottie's health status. He continues to to have a declining central neural degenerative disease and has recurrent episodes of septicemia as well as urinary tract infections. He now has had infection with only one IV antibiotic that was effective against the organism. Discussed with father the fact that hospice might be a very realistic consideration to prevent further exposure to resistant bacteria as infection as well as keeping him from carrying him back in for hospital hospital nearly every month basis in the last several months. I am awaiting his return call after discussion with his wife as far as referral for hospice services

## 2015-03-25 NOTE — Progress Notes (Signed)
Name: Kent [name]   MRN: [id number]    DOB: 1965/09/17   Date:03/25/2015       Progress Note  Subjective  Chief Complaint  Chief Complaint  Patient presents with  . Hospitalization Follow-up  . Cough    Cough This is a chronic problem. The current episode started more than 1 month ago. The problem has been waxing and waning. The problem occurs every few hours. The cough is non-productive. Associated symptoms include a fever. Pertinent negatives include no chest pain, chills, eye redness, headaches, heartburn, hemoptysis, myalgias, sore throat, shortness of breath or weight loss. The symptoms are aggravated by lying down. He has tried prescription cough suppressant for the symptoms. The treatment provided mild relief. His past medical history is significant for pneumonia.   Progressive degenerative neurologic disease  Patient remains wheelchair-bound with flaccid quadriparesis every time requires total care. It is of note that he has had numerous hospitalizations in the last several months. Most of these have been for sepsis or urinary tract infections. The most recent in the last few days was for hypothermia. This was thought to be central in origin  Next systemic inflammatory response syndrome  Asian hospitalized within the last 2 weeks with episode of SIRS which started with his urinary system. He required IV Tressie Ellis and currently the most recent organism is not sensitive to any other antibiotics.   Long-term care issues  I had a lengthy discussion with the father who is very elderly and frail regarding Kent [name]'s long-term care. He has a progressive neurological neurological deterioration and has been wheelchair bound essentially since his late teens or early 23s. He requires total care and is age aging parents are not able to adequately care for him as is evidenced by his more frequent hospitalizations every few weeks to months  I discussed with the father as I have with his wife  present in the past that consideration for hospice is a very real option and should be seriously considered given the advanced age and the seriousness of Kent [name]'s condition and his continued deterioration. In addition his frequent readmissions to the hospital is making him more sensitive suspected to organisms for which there is very little antibiotic therapy  Hypothermia.  Patient recently was hospitalized for an episode of hypothermia which found not to be of infectious origin but was most likely centrally caused by his progressive degenerative neurologic disease.   Past Medical History  Diagnosis Date  . Frontotemporal dementia   . Sleep apnea   . Hypertension   . Depression   . Neurogenic bladder   . Myelopathy   . Obesity   . Degeneration, intervertebral disc, cervical   . Elevated liver function tests   . Pulmonary embolus     on chronic anticoagulation  . Chronic UTI (urinary tract infection)   . Pernicious anemia   . Rosacea   . BPH (benign prostatic hypertrophy)   . History of DVT (deep vein thrombosis)   . History of pulmonary embolism     currently on coumadin  . Anxiety   . New onset a-fib 2013    History  Substance Use Topics  . Smoking status: Never Smoker   . Smokeless tobacco: Not on file  . Alcohol Use: No     Current outpatient prescriptions:  .  benzonatate (TESSALON) 100 MG capsule, Take 100 mg by mouth every 8 (eight) hours as needed for cough., Disp: , Rfl:  .  cefpodoxime (VANTIN) 200 MG tablet, TAKE  1 TABLET (200 MG TOTAL) BY MOUTH TWO (2) TIMES A DAY. FOR 10 DAYS, Disp: , Rfl: 0 .  citalopram (CELEXA) 20 MG tablet, Take 20 mg by mouth 2 (two) times daily., Disp: , Rfl:  .  clindamycin (CLEOCIN T) 1 % external solution, Apply 1 application topically 2 (two) times daily. , Disp: , Rfl:  .  clotrimazole-betamethasone (LOTRISONE) cream, APPLY TO WOUND TWICE A DAY FOR 10 DAYS (Patient not taking: Reported on 03/22/2015), Disp: 45 g, Rfl: 0 .   cyanocobalamin (,VITAMIN B-12,) 1000 MCG/ML injection, Inject 500 mcg into the muscle every 30 (thirty) days., Disp: , Rfl:  .  dextromethorphan (DELSYM) 30 MG/5ML liquid, Take 15 mg by mouth 2 (two) times daily as needed for cough. , Disp: , Rfl:  .  flecainide (TAMBOCOR) 50 MG tablet, Take 50 mg by mouth 2 (two) times daily., Disp: , Rfl:  .  fluticasone (FLONASE) 50 MCG/ACT nasal spray, Place 1 spray into both nostrils 2 (two) times daily., Disp: , Rfl:  .  gluconic acid-citric acid (RENACIDIN) irrigation, Irrigate with 30 mLs as directed 3 (three) times daily., Disp: , Rfl:  .  guaiFENesin (MUCINEX) 600 MG 12 hr tablet, Take 600 mg by mouth 2 (two) times daily. , Disp: , Rfl:  .  HYDROcodone-acetaminophen (NORCO/VICODIN) 5-325 MG per tablet, Take 1-2 tablets by mouth every 6 (six) hours as needed for moderate pain., Disp: , Rfl:  .  ipratropium-albuterol (DUONEB) 0.5-2.5 (3) MG/3ML SOLN, Take 3 mLs by nebulization 4 (four) times daily as needed (for shortness of breath)., Disp: , Rfl:  .  ketoconazole (NIZORAL) 2 % shampoo, Apply 1 application topically once a week. Pt uses on Friday., Disp: , Rfl:  .  levofloxacin (LEVAQUIN) 500 MG tablet, Take 500 mg by mouth daily., Disp: , Rfl: 0 .  loratadine (CLARITIN) 10 MG tablet, Take 10 mg by mouth daily., Disp: , Rfl:  .  LORazepam (ATIVAN) 0.5 MG tablet, Take 0.5 mg by mouth 2 (two) times daily., Disp: , Rfl:  .  lovastatin (MEVACOR) 40 MG tablet, Take 40 mg by mouth every evening. , Disp: , Rfl:  .  Methen-Hyosc-Meth Blue-Na Phos (UROGESIC-BLUE) 81.6 MG TABS, TAKE 1 TABLET BY MOUTH 2 (TWO) TIMES DAILY., Disp: , Rfl: 1 .  metoprolol tartrate (LOPRESSOR) 25 MG tablet, Take 25 mg by mouth 2 (two) times daily., Disp: , Rfl:  .  nitrofurantoin (MACRODANTIN) 100 MG capsule, TAKE 1 CAPSULE (100 MG TOTAL) BY MOUTH DAILY., Disp: , Rfl: 5 .  omeprazole (PRILOSEC) 40 MG capsule, Take 40 mg by mouth daily. , Disp: , Rfl:  .  oxybutynin (DITROPAN) 5 MG tablet,  Take 5 mg by mouth 3 (three) times daily., Disp: , Rfl:  .  polyethylene glycol (MIRALAX / GLYCOLAX) packet, Take 17 g by mouth daily., Disp: , Rfl:  .  QUEtiapine (SEROQUEL) 50 MG tablet, Take 50 mg by mouth at bedtime., Disp: , Rfl:  .  warfarin (COUMADIN) 5 MG tablet, Take 0.5-1 tablets (2.5-5 mg total) by mouth daily. Pt alternates between 2.5 mg and 5 mg on an every other day basis., Disp: 30 tablet, Rfl: 0  Allergies  Allergen Reactions  . Codeine Nausea Only and Other (See Comments)  . Piperacillin-Tazobactam In Dex Rash    Arm erythema on administration, but tolerated cephalosporins fine  . Vancomycin Other (See Comments) and Rash    Caused him to turn red and skin peeled off Reaction:  Red man syndrome   .  Zosyn [Piperacillin Sod-Tazobactam So] Rash and Other (See Comments)    Pt experienced arm erythema on administration, but tolerated cephalosporins fine.      Review of Systems  Constitutional: Positive for fever and diaphoresis. Negative for chills and weight loss.  HENT: Positive for congestion. Negative for hearing loss, sore throat and tinnitus.   Eyes: Negative for blurred vision, double vision and redness.  Respiratory: Positive for cough. Negative for hemoptysis, sputum production and shortness of breath.   Cardiovascular: Positive for palpitations. Negative for chest pain, orthopnea, claudication and leg swelling.  Gastrointestinal: Negative for heartburn, nausea, vomiting, diarrhea, constipation and blood in stool.  Genitourinary: Negative for dysuria, urgency, frequency and hematuria.       Neurogenic bladder  Musculoskeletal: Negative for myalgias, back pain, joint pain, falls and neck pain.       Muscle atony.  Skin: Negative for itching.  Neurological: Negative for dizziness, tingling, tremors, focal weakness, seizures, loss of consciousness, weakness and headaches.       Patient has flaccid paraparesis and is completely wheelchair bound. He is mostly somnolent  but will respond with the eyes and mumble a few unintelligible words. His sensorium is reduced. He is definitely cognitively impaired  Endo/Heme/Allergies: Does not bruise/bleed easily.  Psychiatric/Behavioral: Positive for depression and memory loss. Negative for substance abuse. The patient is not nervous/anxious and does not have insomnia.      Objective  Filed Vitals:   03/25/15 1418  BP: 118/72  Pulse: 63  Temp: 97.4 F (36.3 C)  TempSrc: Axillary  Resp: 16  SpO2: 92%     Physical Exam    Assessment & Plan  1. Paroxysmal atrial fibrillation currently anticoagulated  2. Essential hypertension Well-controlled  3. Dementia, without behavioral disturbance Progressive  4. Gastroesophageal reflux disease without esophagitis Stable  5. Neurodegenerative disorder Aggressively worsening  6. Pressure ulcer of buttock, unspecified laterality, stage II Table  7. UTI (lower urinary tract infection) Recurrent 8. Chronic suprapubic catheter Followed by urologist  9. History of pulmonary embolism Currently on anticoagulation  10. Hyperlipidemia Stable  11. Hypothermia, initial encounter Kent [name] was central in origin  45. Neurogenic bladder Long-standing  13. History of sepsis Recently within the last several weeks  14. Calculus of kidney By  urologist t  15. Acontractile bladder Urologist  16. Complications due to genitourinary device, implant, and graft, subsequent encounter By urologist by urologist  17. Detrusor hyperreflexia Per urologist  18. Frequent UTI By urologist by urologist  19. Hematuria, microscopic Urologist  20. Hypothermia, subsequent encounter Resolved

## 2015-03-26 LAB — URINE CULTURE: Culture: 30000

## 2015-03-27 LAB — CULTURE, BLOOD (ROUTINE X 2)
Culture: NO GROWTH
Culture: NO GROWTH

## 2015-04-02 ENCOUNTER — Telehealth: Payer: Self-pay | Admitting: Emergency Medicine

## 2015-04-02 NOTE — Telephone Encounter (Signed)
Mr. Kent [name] called regarding Kent [name]. Patient was seen by Dr. Jacqlyn Larsen in Urology. Would like to no if patient can stop coumadin for 5 days prior to Botox procedure for bladder.

## 2015-04-05 ENCOUNTER — Telehealth: Payer: Self-pay | Admitting: Family Medicine

## 2015-04-05 NOTE — Telephone Encounter (Signed)
[facility] called just with a FYI to inform Dr Rutherford Nail that they did visit the family and they are waiting to hear back from them.

## 2015-04-05 NOTE — Telephone Encounter (Signed)
Debra from [facility] states that you faxed a form and the family is requesting more time to think about it. If you have any questions give her a call [phone]

## 2015-04-08 NOTE — Addendum Note (Signed)
Addended by: Ashok Norris on: 04/08/2015 01:57 PM   Modules accepted: Level of Service

## 2015-04-14 ENCOUNTER — Other Ambulatory Visit: Payer: Self-pay | Admitting: Family Medicine

## 2015-04-19 ENCOUNTER — Telehealth: Payer: Self-pay

## 2015-04-19 ENCOUNTER — Other Ambulatory Visit: Payer: Self-pay | Admitting: Family Medicine

## 2015-04-19 ENCOUNTER — Other Ambulatory Visit: Payer: Self-pay | Admitting: Cardiovascular Disease

## 2015-04-19 NOTE — Telephone Encounter (Signed)
[facility] called with his PT/INR results which were PT 17 and INR 1.4. She stated that pt has been off of coumadin since Fri. Due to appointment for Botox injection that he will have on Thur.

## 2015-04-20 NOTE — Telephone Encounter (Signed)
ok 

## 2015-04-22 ENCOUNTER — Ambulatory Visit (INDEPENDENT_AMBULATORY_CARE_PROVIDER_SITE_OTHER): Payer: Medicare Other | Admitting: Family Medicine

## 2015-04-22 DIAGNOSIS — I82409 Acute embolism and thrombosis of unspecified deep veins of unspecified lower extremity: Secondary | ICD-10-CM | POA: Diagnosis not present

## 2015-04-22 LAB — POCT INR: INR: 1.4

## 2015-05-03 ENCOUNTER — Other Ambulatory Visit: Payer: Self-pay | Admitting: Family Medicine

## 2015-05-12 ENCOUNTER — Telehealth: Payer: Self-pay | Admitting: Family Medicine

## 2015-05-12 NOTE — Telephone Encounter (Signed)
Kent [name] from [facility] is requesting return call today. Patient INR is 4.3 and his TEMP is 93.6. She is requesting an order for a urine culture. Christy called Dr Jacqlyn Larsen on 05-11-15 but he did not return her call. [phone]

## 2015-05-12 NOTE — Telephone Encounter (Signed)
Called Christy at [facility] and left message. Spoke to [employer] at [facility] to have her notify Chrisy to sent patient to ER per Dr. Nadine Counts.

## 2015-05-12 NOTE — Telephone Encounter (Signed)
Spoke to [place] at [facility] . PAtient notified to go to ER. Pt and temp was repeated over with same results

## 2015-05-14 ENCOUNTER — Inpatient Hospital Stay
Admission: EM | Admit: 2015-05-14 | Discharge: 2015-05-19 | DRG: 177 | Disposition: A | Discharge status: Home / Self Care | Payer: Medicare Other | Attending: Internal Medicine | Admitting: Internal Medicine

## 2015-05-14 ENCOUNTER — Encounter: Payer: Self-pay | Admitting: Emergency Medicine

## 2015-05-14 ENCOUNTER — Emergency Department: Payer: Medicare Other

## 2015-05-14 ENCOUNTER — Other Ambulatory Visit: Payer: Self-pay

## 2015-05-14 DIAGNOSIS — J189 Pneumonia, unspecified organism: Secondary | ICD-10-CM | POA: Diagnosis present

## 2015-05-14 DIAGNOSIS — J690 Pneumonitis due to inhalation of food and vomit: Principal | ICD-10-CM | POA: Diagnosis present

## 2015-05-14 DIAGNOSIS — N319 Neuromuscular dysfunction of bladder, unspecified: Secondary | ICD-10-CM | POA: Diagnosis not present

## 2015-05-14 DIAGNOSIS — A419 Sepsis, unspecified organism: Secondary | ICD-10-CM | POA: Diagnosis present

## 2015-05-14 DIAGNOSIS — Z87442 Personal history of urinary calculi: Secondary | ICD-10-CM

## 2015-05-14 DIAGNOSIS — Z86718 Personal history of other venous thrombosis and embolism: Secondary | ICD-10-CM

## 2015-05-14 DIAGNOSIS — N400 Enlarged prostate without lower urinary tract symptoms: Secondary | ICD-10-CM | POA: Diagnosis present

## 2015-05-14 DIAGNOSIS — T8351XA Infection and inflammatory reaction due to indwelling urinary catheter, initial encounter: Secondary | ICD-10-CM | POA: Diagnosis present

## 2015-05-14 DIAGNOSIS — G4730 Sleep apnea, unspecified: Secondary | ICD-10-CM | POA: Diagnosis present

## 2015-05-14 DIAGNOSIS — Z6841 Body Mass Index (BMI) 40.0 and over, adult: Secondary | ICD-10-CM

## 2015-05-14 DIAGNOSIS — Z8249 Family history of ischemic heart disease and other diseases of the circulatory system: Secondary | ICD-10-CM

## 2015-05-14 DIAGNOSIS — Z881 Allergy status to other antibiotic agents status: Secondary | ICD-10-CM

## 2015-05-14 DIAGNOSIS — Z86711 Personal history of pulmonary embolism: Secondary | ICD-10-CM | POA: Diagnosis not present

## 2015-05-14 DIAGNOSIS — D1800 Hemangioma unspecified site: Secondary | ICD-10-CM | POA: Diagnosis present

## 2015-05-14 DIAGNOSIS — N390 Urinary tract infection, site not specified: Secondary | ICD-10-CM | POA: Diagnosis present

## 2015-05-14 DIAGNOSIS — I10 Essential (primary) hypertension: Secondary | ICD-10-CM | POA: Diagnosis present

## 2015-05-14 DIAGNOSIS — Y846 Urinary catheterization as the cause of abnormal reaction of the patient, or of later complication, without mention of misadventure at the time of the procedure: Secondary | ICD-10-CM | POA: Diagnosis present

## 2015-05-14 DIAGNOSIS — F0280 Dementia in other diseases classified elsewhere without behavioral disturbance: Secondary | ICD-10-CM | POA: Diagnosis present

## 2015-05-14 DIAGNOSIS — J9601 Acute respiratory failure with hypoxia: Secondary | ICD-10-CM | POA: Diagnosis present

## 2015-05-14 DIAGNOSIS — Z7901 Long term (current) use of anticoagulants: Secondary | ICD-10-CM

## 2015-05-14 DIAGNOSIS — D181 Lymphangioma, any site: Secondary | ICD-10-CM | POA: Diagnosis present

## 2015-05-14 DIAGNOSIS — Z792 Long term (current) use of antibiotics: Secondary | ICD-10-CM | POA: Diagnosis not present

## 2015-05-14 DIAGNOSIS — E669 Obesity, unspecified: Secondary | ICD-10-CM | POA: Diagnosis present

## 2015-05-14 DIAGNOSIS — Z435 Encounter for attention to cystostomy: Secondary | ICD-10-CM | POA: Diagnosis not present

## 2015-05-14 DIAGNOSIS — G3109 Other frontotemporal dementia: Secondary | ICD-10-CM | POA: Diagnosis present

## 2015-05-14 DIAGNOSIS — M5030 Other cervical disc degeneration, unspecified cervical region: Secondary | ICD-10-CM | POA: Diagnosis present

## 2015-05-14 DIAGNOSIS — R0902 Hypoxemia: Secondary | ICD-10-CM

## 2015-05-14 DIAGNOSIS — T83511A Infection and inflammatory reaction due to indwelling urethral catheter, initial encounter: Secondary | ICD-10-CM

## 2015-05-14 DIAGNOSIS — I4891 Unspecified atrial fibrillation: Secondary | ICD-10-CM | POA: Diagnosis present

## 2015-05-14 LAB — CBC WITH DIFFERENTIAL/PLATELET
Basophils Absolute: 0.1 10*3/uL (ref 0–0.1)
Basophils Relative: 0 %
EOS PCT: 1 %
Eosinophils Absolute: 0.1 10*3/uL (ref 0–0.7)
HEMATOCRIT: 36.4 % — AB (ref 40.0–52.0)
Hemoglobin: 11.8 g/dL — ABNORMAL LOW (ref 13.0–18.0)
LYMPHS ABS: 0.8 10*3/uL — AB (ref 1.0–3.6)
LYMPHS PCT: 5 %
MCH: 28.3 pg (ref 26.0–34.0)
MCHC: 32.5 g/dL (ref 32.0–36.0)
MCV: 87.2 fL (ref 80.0–100.0)
Monocytes Absolute: 1.4 10*3/uL — ABNORMAL HIGH (ref 0.2–1.0)
Monocytes Relative: 8 %
Neutro Abs: 14.3 10*3/uL — ABNORMAL HIGH (ref 1.4–6.5)
Neutrophils Relative %: 86 %
PLATELETS: 183 10*3/uL (ref 150–440)
RBC: 4.17 MIL/uL — AB (ref 4.40–5.90)
RDW: 20.6 % — ABNORMAL HIGH (ref 11.5–14.5)
WBC: 16.7 10*3/uL — AB (ref 3.8–10.6)

## 2015-05-14 LAB — URINALYSIS COMPLETE WITH MICROSCOPIC (ARMC ONLY)
SPECIFIC GRAVITY, URINE: 1.008 (ref 1.005–1.030)
SQUAMOUS EPITHELIAL / LPF: NONE SEEN

## 2015-05-14 LAB — LIPASE, BLOOD: Lipase: 20 U/L — ABNORMAL LOW (ref 22–51)

## 2015-05-14 LAB — COMPREHENSIVE METABOLIC PANEL
ALT: 38 U/L (ref 17–63)
AST: 32 U/L (ref 15–41)
Albumin: 3.5 g/dL (ref 3.5–5.0)
Alkaline Phosphatase: 84 U/L (ref 38–126)
Anion gap: 7 (ref 5–15)
BUN: 12 mg/dL (ref 6–20)
CALCIUM: 9.2 mg/dL (ref 8.9–10.3)
CHLORIDE: 102 mmol/L (ref 101–111)
CO2: 27 mmol/L (ref 22–32)
CREATININE: 0.48 mg/dL — AB (ref 0.61–1.24)
GFR calc Af Amer: >60 mL/min (ref >60)
Glucose, Bld: 99 mg/dL (ref 65–99)
Potassium: 3.7 mmol/L (ref 3.5–5.1)
Sodium: 136 mmol/L (ref 135–145)
Total Bilirubin: 1 mg/dL (ref 0.3–1.2)
Total Protein: 7.1 g/dL (ref 6.5–8.1)

## 2015-05-14 LAB — PROTIME-INR
INR: 2.85
PROTHROMBIN TIME: 30 s — AB (ref 11.4–15.0)

## 2015-05-14 LAB — TROPONIN I: Troponin I: <0.03 ng/mL (ref <0.031)

## 2015-05-14 MED ORDER — FLUTICASONE PROPIONATE 50 MCG/ACT NA SUSP
1.0000 | Freq: Two times a day (BID) | NASAL | Status: DC
  Administered 2015-05-14 – 2015-05-19 (×9): 1 via NASAL
  Filled 2015-05-14: qty 16

## 2015-05-14 MED ORDER — SODIUM CHLORIDE 0.9 % IV SOLN
500.0000 mg | Freq: Three times a day (TID) | INTRAVENOUS | Status: DC
  Administered 2015-05-14 – 2015-05-15 (×2): 500 mg via INTRAVENOUS
  Filled 2015-05-14 (×8): qty 0.5

## 2015-05-14 MED ORDER — MORPHINE SULFATE (PF) 4 MG/ML IV SOLN
4.0000 mg | Freq: Once | INTRAVENOUS | Status: AC
  Administered 2015-05-14: 4 mg via INTRAVENOUS
  Filled 2015-05-14: qty 1

## 2015-05-14 MED ORDER — WARFARIN SODIUM 5 MG PO TABS
5.0000 mg | ORAL_TABLET | ORAL | Status: DC
  Filled 2015-05-14: qty 1

## 2015-05-14 MED ORDER — GUAIFENESIN ER 600 MG PO TB12
600.0000 mg | ORAL_TABLET | Freq: Two times a day (BID) | ORAL | Status: DC
  Administered 2015-05-14 – 2015-05-19 (×5): 600 mg via ORAL
  Filled 2015-05-14 (×5): qty 1

## 2015-05-14 MED ORDER — NITROFURANTOIN MACROCRYSTAL 100 MG PO CAPS
100.0000 mg | ORAL_CAPSULE | Freq: Every evening | ORAL | Status: DC
  Administered 2015-05-14: 19:00:00 100 mg via ORAL
  Filled 2015-05-14 (×2): qty 1

## 2015-05-14 MED ORDER — UROGESIC-BLUE 81.6 MG PO TABS: 1.0000 | ORAL_TABLET | Freq: Two times a day (BID) | ORAL | Status: DC

## 2015-05-14 MED ORDER — PANTOPRAZOLE SODIUM 40 MG PO TBEC
40.0000 mg | DELAYED_RELEASE_TABLET | Freq: Every day | ORAL | Status: DC
  Administered 2015-05-15 – 2015-05-19 (×3): 40 mg via ORAL
  Filled 2015-05-14 (×3): qty 1

## 2015-05-14 MED ORDER — LORAZEPAM 0.5 MG PO TABS
0.5000 mg | ORAL_TABLET | Freq: Two times a day (BID) | ORAL | Status: DC
  Administered 2015-05-14 – 2015-05-15 (×2): 0.5 mg via ORAL
  Filled 2015-05-14 (×2): qty 1

## 2015-05-14 MED ORDER — WARFARIN - PHYSICIAN DOSING INPATIENT: Freq: Every day | Status: DC

## 2015-05-14 MED ORDER — HYDROCODONE-ACETAMINOPHEN 5-325 MG PO TABS: 1.0000 | ORAL_TABLET | Freq: Four times a day (QID) | ORAL | Status: DC | PRN

## 2015-05-14 MED ORDER — IOHEXOL 240 MG/ML SOLN: 25.0000 mL | Freq: Once | INTRAMUSCULAR | Status: DC | PRN

## 2015-05-14 MED ORDER — LEVOFLOXACIN IN D5W 500 MG/100ML IV SOLN
500.0000 mg | INTRAVENOUS | Status: DC
  Administered 2015-05-14 – 2015-05-18 (×5): 500 mg via INTRAVENOUS
  Filled 2015-05-14 (×7): qty 100

## 2015-05-14 MED ORDER — HYDROCODONE-ACETAMINOPHEN 5-325 MG PO TABS
1.0000 | ORAL_TABLET | Freq: Once | ORAL | Status: AC
  Administered 2015-05-14: 1 via ORAL
  Filled 2015-05-14: qty 1

## 2015-05-14 MED ORDER — DEXTROSE 5 % IV SOLN
500.0000 mg | Freq: Once | INTRAVENOUS | Status: DC
  Filled 2015-05-14: qty 500

## 2015-05-14 MED ORDER — ONDANSETRON HCL 4 MG/2ML IJ SOLN
4.0000 mg | INTRAMUSCULAR | Status: AC
  Administered 2015-05-14: 4 mg via INTRAVENOUS
  Filled 2015-05-14: qty 2

## 2015-05-14 MED ORDER — LORATADINE 10 MG PO TABS
10.0000 mg | ORAL_TABLET | Freq: Every day | ORAL | Status: DC
  Administered 2015-05-14 – 2015-05-18 (×2): 10 mg via ORAL
  Filled 2015-05-14 (×2): qty 1

## 2015-05-14 MED ORDER — DEXTROMETHORPHAN POLISTIREX ER 30 MG/5ML PO SUER
90.0000 mg | Freq: Two times a day (BID) | ORAL | Status: DC | PRN
  Administered 2015-05-14: 90 mg via ORAL
  Filled 2015-05-14 (×4): qty 15

## 2015-05-14 MED ORDER — KETOCONAZOLE 2 % EX SHAM
1.0000 "application " | MEDICATED_SHAMPOO | CUTANEOUS | Status: DC
  Filled 2015-05-14: qty 120

## 2015-05-14 MED ORDER — CLINDAMYCIN PHOSPHATE 1 % EX SOLN
1.0000 "application " | Freq: Two times a day (BID) | CUTANEOUS | Status: DC
  Administered 2015-05-14 – 2015-05-19 (×8): 1 via TOPICAL
  Filled 2015-05-14: qty 30

## 2015-05-14 MED ORDER — DEXTROSE 5 % IV SOLN
1.0000 g | Freq: Once | INTRAVENOUS | Status: AC
  Administered 2015-05-14: 1 g via INTRAVENOUS
  Filled 2015-05-14: qty 1

## 2015-05-14 MED ORDER — WARFARIN SODIUM 5 MG PO TABS
2.5000 mg | ORAL_TABLET | ORAL | Status: DC
  Administered 2015-05-18: 17:00:00 2.5 mg via ORAL
  Filled 2015-05-14: qty 1

## 2015-05-14 MED ORDER — METOPROLOL TARTRATE 25 MG PO TABS
12.5000 mg | ORAL_TABLET | Freq: Every day | ORAL | Status: DC
Start: 2015-05-15 — End: 2015-05-19
  Administered 2015-05-18: 14:00:00 12.5 mg via ORAL
  Filled 2015-05-14 (×3): qty 1

## 2015-05-14 MED ORDER — QUETIAPINE FUMARATE 25 MG PO TABS
50.0000 mg | ORAL_TABLET | Freq: Every day | ORAL | Status: DC
  Administered 2015-05-14 – 2015-05-18 (×2): 50 mg via ORAL
  Filled 2015-05-14 (×2): qty 2

## 2015-05-14 MED ORDER — CITALOPRAM HYDROBROMIDE 20 MG PO TABS
20.0000 mg | ORAL_TABLET | Freq: Two times a day (BID) | ORAL | Status: DC
  Administered 2015-05-14 – 2015-05-19 (×5): 20 mg via ORAL
  Filled 2015-05-14 (×5): qty 1

## 2015-05-14 MED ORDER — BENZONATATE 100 MG PO CAPS: 100.0000 mg | ORAL_CAPSULE | Freq: Three times a day (TID) | ORAL | Status: DC | PRN

## 2015-05-14 MED ORDER — PRAVASTATIN SODIUM 10 MG PO TABS
10.0000 mg | ORAL_TABLET | Freq: Every day | ORAL | Status: DC
  Administered 2015-05-14 – 2015-05-18 (×2): 10 mg via ORAL
  Filled 2015-05-14 (×2): qty 1

## 2015-05-14 MED ORDER — IOHEXOL 300 MG/ML  SOLN
125.0000 mL | Freq: Once | INTRAMUSCULAR | Status: AC | PRN
  Administered 2015-05-14: 125 mL via INTRAVENOUS

## 2015-05-14 MED ORDER — WARFARIN SODIUM 5 MG PO TABS: 2.5000 mg | ORAL_TABLET | Freq: Every day | ORAL | Status: DC

## 2015-05-14 MED ORDER — CYANOCOBALAMIN 1000 MCG/ML IJ SOLN
500.0000 ug | INTRAMUSCULAR | Status: DC
  Filled 2015-05-14: qty 0.5

## 2015-05-14 MED ORDER — CITRIC ACID-D GLUCONIC ACID IR SOLN
30.0000 mL | Freq: Three times a day (TID) | Status: DC
  Filled 2015-05-14: qty 500

## 2015-05-14 MED ORDER — OXYBUTYNIN CHLORIDE 5 MG PO TABS
5.0000 mg | ORAL_TABLET | Freq: Three times a day (TID) | ORAL | Status: DC
  Administered 2015-05-14 – 2015-05-19 (×7): 5 mg via ORAL
  Filled 2015-05-14 (×7): qty 1

## 2015-05-14 MED ORDER — SODIUM CHLORIDE 0.9 % IV BOLUS (SEPSIS)
1000.0000 mL | Freq: Once | INTRAVENOUS | Status: AC
Start: 2015-05-14 — End: 2015-05-14
  Administered 2015-05-14: 1000 mL via INTRAVENOUS

## 2015-05-14 MED ORDER — POLYETHYLENE GLYCOL 3350 17 G PO PACK
17.0000 g | PACK | Freq: Every day | ORAL | Status: DC
  Filled 2015-05-14 (×3): qty 1

## 2015-05-14 MED ORDER — FLECAINIDE ACETATE 50 MG PO TABS
50.0000 mg | ORAL_TABLET | Freq: Two times a day (BID) | ORAL | Status: DC
  Administered 2015-05-14 – 2015-05-19 (×5): 50 mg via ORAL
  Filled 2015-05-14 (×6): qty 1

## 2015-05-14 MED ORDER — SODIUM CHLORIDE 0.9 % IV SOLN
INTRAVENOUS | Status: DC
  Administered 2015-05-14 – 2015-05-18 (×5): via INTRAVENOUS

## 2015-05-14 NOTE — ED Notes (Signed)
Pt is normally unresponsive, parents state last night he was holding his abd. And was given pain medication last night per father he doesn't normally need it.

## 2015-05-14 NOTE — ED Notes (Signed)
Pt back from CT

## 2015-05-14 NOTE — H&P (Signed)
Kent [name] is an 49 y.o. male.   Chief Complaint: Right sided abd pain HPI: Pt is non verbal. Parents are with him and stated in was in pain starting last night crabbing the upper right abd area. CT scan shows right sided pneumonia. Has had congestion since last time he was in the hospital.  Past Medical History  Diagnosis Date  . Frontotemporal dementia   . Sleep apnea   . Hypertension   . Depression   . Neurogenic bladder   . Myelopathy   . Obesity   . Degeneration, intervertebral disc, cervical   . Elevated liver function tests   . Pulmonary embolus     on chronic anticoagulation  . Chronic UTI (urinary tract infection)   . Pernicious anemia   . Rosacea   . BPH (benign prostatic hypertrophy)   . History of DVT (deep vein thrombosis)   . History of pulmonary embolism     currently on coumadin  . Anxiety   . New onset a-fib 2013    Past Surgical History  Procedure Laterality Date  . Suprapubic catheter insertion    . Kidney stone surgery      Family History  Problem Relation Age of Onset  . Hypertension Father   . Hyperlipidemia Father   . Hypertension Mother    Social History:  reports that he has never smoked. He does not have any smokeless tobacco history on file. He reports that he does not drink alcohol or use illicit drugs.  Allergies:  Allergies  Allergen Reactions  . Codeine Nausea Only  . Piperacillin-Tazobactam In Dex Rash and Other (See Comments)    Arm erythema on administration, but tolerated cephalosporins fine.    . Vancomycin Rash and Other (See Comments)    Reaction:  Red man syndrome   . Zosyn [Piperacillin Sod-Tazobactam So] Rash and Other (See Comments)    Pt experienced arm erythema on administration, but tolerated cephalosporins fine.       (Not in a hospital admission)  Results for orders placed or performed during the hospital encounter of 05/14/15 (from the past 48 hour(s))  CBC with Differential     Status: Abnormal    Collection Time: 05/14/15 12:09 PM  Result Value Ref Range   WBC 16.7 (H) 3.8 - 10.6 K/uL   RBC 4.17 (L) 4.40 - 5.90 MIL/uL   Hemoglobin 11.8 (L) 13.0 - 18.0 g/dL   HCT 63.8 (L) 45.3 - 64.6 %   MCV 87.2 80.0 - 100.0 fL   MCH 28.3 26.0 - 34.0 pg   MCHC 32.5 32.0 - 36.0 g/dL   RDW 80.3 (H) 21.2 - 24.8 %   Platelets 183 150 - 440 K/uL   Neutrophils Relative % 86 %   Neutro Abs 14.3 (H) 1.4 - 6.5 K/uL   Lymphocytes Relative 5 %   Lymphs Abs 0.8 (L) 1.0 - 3.6 K/uL   Monocytes Relative 8 %   Monocytes Absolute 1.4 (H) 0.2 - 1.0 K/uL   Eosinophils Relative 1 %   Eosinophils Absolute 0.1 0 - 0.7 K/uL   Basophils Relative 0 %   Basophils Absolute 0.1 0 - 0.1 K/uL  Comprehensive metabolic panel     Status: Abnormal   Collection Time: 05/14/15 12:09 PM  Result Value Ref Range   Sodium 136 135 - 145 mmol/L   Potassium 3.7 3.5 - 5.1 mmol/L   Chloride 102 101 - 111 mmol/L   CO2 27 22 - 32 mmol/L  Glucose, Bld 99 65 - 99 mg/dL   BUN 12 6 - 20 mg/dL   Creatinine, Ser 0.48 (L) 0.61 - 1.24 mg/dL   Calcium 9.2 8.9 - 10.3 mg/dL   Total Protein 7.1 6.5 - 8.1 g/dL   Albumin 3.5 3.5 - 5.0 g/dL   AST 32 15 - 41 U/L   ALT 38 17 - 63 U/L   Alkaline Phosphatase 84 38 - 126 U/L   Total Bilirubin 1.0 0.3 - 1.2 mg/dL   GFR calc non Af Amer >60 >60 mL/min   GFR calc Af Amer >60 >60 mL/min    Comment: (NOTE) The eGFR has been calculated using the CKD EPI equation. This calculation has not been validated in all clinical situations. eGFR's persistently <60 mL/min signify possible Chronic Kidney Disease.    Anion gap 7 5 - 15  Troponin I     Status: None   Collection Time: 05/14/15 12:09 PM  Result Value Ref Range   Troponin I <0.03 <0.031 ng/mL    Comment:        NO INDICATION OF MYOCARDIAL INJURY.   Lipase, blood     Status: Abnormal   Collection Time: 05/14/15 12:09 PM  Result Value Ref Range   Lipase 20 (L) 22 - 51 U/L  Urinalysis complete, with microscopic ([facility] only)     Status:  Abnormal   Collection Time: 05/14/15 12:39 PM  Result Value Ref Range   Color, Urine GREEN (A) YELLOW   APPearance HAZY (A) CLEAR   Glucose, UA (A) NEGATIVE mg/dL    TEST NOT REPORTED DUE TO COLOR INTERFERENCE OF URINE PIGMENT   Bilirubin Urine (A) NEGATIVE    TEST NOT REPORTED DUE TO COLOR INTERFERENCE OF URINE PIGMENT   Ketones, ur (A) NEGATIVE mg/dL    TEST NOT REPORTED DUE TO COLOR INTERFERENCE OF URINE PIGMENT   Specific Gravity, Urine 1.008 1.005 - 1.030   Hgb urine dipstick (A) NEGATIVE    TEST NOT REPORTED DUE TO COLOR INTERFERENCE OF URINE PIGMENT   pH  5.0 - 8.0    TEST NOT REPORTED DUE TO COLOR INTERFERENCE OF URINE PIGMENT   Protein, ur (A) NEGATIVE mg/dL    TEST NOT REPORTED DUE TO COLOR INTERFERENCE OF URINE PIGMENT   Nitrite (A) NEGATIVE    TEST NOT REPORTED DUE TO COLOR INTERFERENCE OF URINE PIGMENT   Leukocytes, UA (A) NEGATIVE    TEST NOT REPORTED DUE TO COLOR INTERFERENCE OF URINE PIGMENT   RBC / HPF 6-30 0 - 5 RBC/hpf   WBC, UA TOO NUMEROUS TO COUNT 0 - 5 WBC/hpf   Bacteria, UA RARE (A) NONE SEEN   Squamous Epithelial / LPF NONE SEEN NONE SEEN   WBC Clumps PRESENT    Mucous PRESENT    Budding Yeast PRESENT    Hyphae Yeast PRESENT    Ct Abdomen Pelvis W Contrast  05/14/2015   CLINICAL DATA:  Right-sided abdominal pain  EXAM: CT ABDOMEN AND PELVIS WITH CONTRAST  TECHNIQUE: Multidetector CT imaging of the abdomen and pelvis was performed using the standard protocol following bolus administration of intravenous contrast.  CONTRAST:  146mL OMNIPAQUE IOHEXOL 300 MG/ML  SOLN  COMPARISON:  12/08/2014  FINDINGS: Lower chest: Patchy bilateral lower lobe nodular and dependent airspace consolidation is identified, right greater than left. Imaging degraded by motion and noise from body habitus. Trace pleural effusions are present.  Hepatobiliary: Unenhanced liver and gallbladder are grossly unremarkable.  Pancreas: Fatty infiltrated but unremarkable.  Spleen: Normal  with the  exception of a stable 1.6 cm cyst or lymphangioma/hemangioma incidentally noted, unchanged.  Adrenals/Urinary Tract: Lobular renal contour with areas of presumed cortical thinning and scarring. No hydroureteronephrosis. No radiopaque ureteral, renal, or bladder calculus. A suprapubic Foley catheter is in place and the bladder is decompressed.  Stomach/Bowel: No bowel wall thickening or focal segmental dilatation is identified. Appendix appears normal. Mild gaseous prominence of the mid colon without overt dilatation or wall thickening.  Vascular/Lymphatic: No aortic aneurysm.  No lymphadenopathy.  Other: No free air or fluid. Bilateral fat containing inguinal hernias are identified.  Musculoskeletal: Mild right hip degenerative change.  IMPRESSION: No acute intra-abdominal or pelvic pathology.  Patchy right greater than left nodular and dependent airspace consolidation suspicious for pneumonia. This could account for the history of right-sided abdominal pain if referred.   Electronically Signed   By: Conchita Paris M.D.   On: 05/14/2015 14:16   Dg Chest Port 1 View  05/14/2015   CLINICAL DATA:  Right-sided pain.  Recent urinary tract infection  EXAM: PORTABLE CHEST - 1 VIEW  COMPARISON:  March 22, 2015  FINDINGS: There is scarring in the right base region. There is no edema or consolidation. Heart is upper normal in size with pulmonary vascularity within normal limits. No adenopathy. No bone lesions.  IMPRESSION: Scarring right base region. No edema or consolidation. No change in cardiac silhouette.   Electronically Signed   By: Lowella Grip [name] M.D.   On: 05/14/2015 13:10    Review of Systems  Unable to perform ROS: dementia    Blood pressure 105/80, pulse 77, temperature 97.6 F (36.4 C), temperature source Axillary, resp. rate 18, height $RemoveBe'5\' 11"'AnVpYSWXL$  (1.803 m), weight 132 kg (291 lb 0.1 oz), SpO2 94 %. Physical Exam  Constitutional: He appears well-developed and well-nourished. No distress.  Obese   HENT:  Head: Normocephalic and atraumatic.  Mouth/Throat: No oropharyngeal exudate.  Oral mucosa dry  Eyes: Pupils are equal, round, and reactive to light. No scleral icterus.  Neck: Neck supple. No JVD present. No tracheal deviation present. No thyromegaly present.  Cardiovascular: Normal rate.   No murmur heard. Respiratory:  Scattered rhonci. No dullness to purcusion No use of accessary muscles  GI: Soft. Bowel sounds are normal. He exhibits distension. He exhibits no mass. There is no tenderness.  Musculoskeletal: He exhibits no edema or tenderness.  Lymphadenopathy:    He has no cervical adenopathy.  Neurological: He is alert.  Unable to cooperate for cranial nerve exam due to dementia. Moves all ext.   Skin: Skin is warm and dry. No erythema.     Assessment/Plan 1. Pneumonia: Suspect aspiration. Allegic to bata- lactams. Will cover with meropenim and levaquin. Continue pureed diet and get speech eval.  2.Chronic UTI: Followed by urology for suprapupic cath. Continue outpt meds.  3. Frontal Lobe dementia: At baseline.  4. HTBN: Controlled with current meds.  Reviewed past medical records. Case discussed with Dr Jacqualine Code.  Time spent= 60 min.  Baxter Hire 05/14/2015, 3:43 PM

## 2015-05-14 NOTE — ED Provider Notes (Signed)
[facility] Emergency Department Provider Note REMINDER - THIS NOTE IS NOT A FINAL MEDICAL RECORD UNTIL IT IS SIGNED. UNTIL THEN, THE CONTENT BELOW MAY REFLECT INFORMATION FROM A DOCUMENTATION TEMPLATE, NOT THE ACTUAL PATIENT VISIT. ____________________________________________  Time seen: Approximately 11:50 AM  I have reviewed the triage vital signs and the nursing notes.   HISTORY  Chief Complaint Abdominal Pain    HPI Kent [name] is a 49 y.o. male who presents with reported abdominal pain and a cough. Mother and father provide the entire history as the patient does not speak to them medicines very noncommunicative per his mom and dad. His family is very accommodating and friendly and helpful history.    Family reports patient does have a history of recent cough which is nonproductive but is "hard for him to get up".  Past Medical History  Diagnosis Date  . Frontotemporal dementia   . Sleep apnea   . Hypertension   . Depression   . Neurogenic bladder   . Myelopathy   . Obesity   . Degeneration, intervertebral disc, cervical   . Elevated liver function tests   . Pulmonary embolus     on chronic anticoagulation  . Chronic UTI (urinary tract infection)   . Pernicious anemia   . Rosacea   . BPH (benign prostatic hypertrophy)   . History of DVT (deep vein thrombosis)   . History of pulmonary embolism     currently on coumadin  . Anxiety   . New onset a-fib 2013    Patient Active Problem List   Diagnosis Date Noted  . Atrial fibrillation 03/25/2015  . Hypothermia 03/22/2015  . Bacterial infection due to Pseudomonas 03/12/2015  . Frequent UTI 03/12/2015  . Cystitis 02/23/2015  . Pressure ulcer of buttock 02/03/2015  . Sepsis 01/30/2015  . Neurogenic bladder 01/30/2015  . Chronic suprapubic catheter 01/30/2015  . Neurodegenerative disorder 01/30/2015  . GERD (gastroesophageal reflux disease) 01/30/2015  . UTI (lower urinary  tract infection) 01/30/2015  . Bladder infection, chronic 01/21/2015  . Detrusor hyperreflexia 01/21/2015  . Mechanical complication of urethral catheter 12/06/2014  . Complications due to genitourinary device, implant, and graft 11/05/2014  . 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 Incomplete bladder emptying 01/28/2014  . Disorder of nervous system 01/28/2014  . Calculus of kidney 06/30/2013  . Urge incontinence 06/30/2013  . Hematuria, microscopic 06/30/2013  . Dementia 08/21/2012  . Hypertension 08/21/2012  . Hyperlipidemia 08/21/2012  . History of pulmonary embolism 08/21/2012    Past Surgical History  Procedure Laterality Date  . Suprapubic catheter insertion    . Kidney stone surgery      Current Outpatient Rx  Name  Route  Sig  Dispense  Refill  . benzonatate (TESSALON) 100 MG capsule   Oral   Take 100 mg by mouth every 8 (eight) hours as needed for cough.         . benzonatate (TESSALON) 100 MG capsule      TAKE ONE CAPSULE BY MOUTH EVERY 8 HOURS AS NEEDED   30 capsule   0   . cefpodoxime (VANTIN) 200 MG tablet      TAKE 1 TABLET (200 MG TOTAL) BY MOUTH TWO (2) TIMES A DAY. FOR 10 DAYS      0   . citalopram (CELEXA) 20 MG tablet   Oral   Take 20 mg by mouth 2 (two) times daily.         . clindamycin (CLEOCIN T) 1 %  external solution      APPLY TO LESION 3 TIMES A DAY   180 mL   0   . clotrimazole-betamethasone (LOTRISONE) cream      APPLY TO WOUND TWICE A DAY FOR 10 DAYS Patient not taking: Reported on 03/22/2015   45 g   0   . cyanocobalamin (,VITAMIN B-12,) 1000 MCG/ML injection   Intramuscular   Inject 500 mcg into the muscle every 30 (thirty) days.         Marland Kitchen dextromethorphan (DELSYM) 30 MG/5ML liquid   Oral   Take 15 mg by mouth 2 (two) times daily as needed for cough.          . flecainide (TAMBOCOR) 50 MG tablet   Oral   Take 50 mg by mouth 2 (two) times daily.         . fluticasone (FLONASE) 50 MCG/ACT nasal spray   Each  Nare   Place 1 spray into both nostrils 2 (two) times daily.         Marland Kitchen gluconic acid-citric acid (RENACIDIN) irrigation   Irrigation   Irrigate with 30 mLs as directed 3 (three) times daily.         Marland Kitchen guaiFENesin (MUCINEX) 600 MG 12 hr tablet   Oral   Take 600 mg by mouth 2 (two) times daily.          Marland Kitchen HYDROcodone-acetaminophen (NORCO/VICODIN) 5-325 MG per tablet   Oral   Take 1-2 tablets by mouth every 6 (six) hours as needed for moderate pain.         Marland Kitchen ipratropium-albuterol (DUONEB) 0.5-2.5 (3) MG/3ML SOLN   Nebulization   Take 3 mLs by nebulization 4 (four) times daily as needed (for shortness of breath).         Marland Kitchen ketoconazole (NIZORAL) 2 % shampoo   Topical   Apply 1 application topically once a week. Pt uses on Friday.         . levofloxacin (LEVAQUIN) 500 MG tablet   Oral   Take 500 mg by mouth daily.      0   . loratadine (CLARITIN) 10 MG tablet   Oral   Take 10 mg by mouth daily.         Marland Kitchen LORazepam (ATIVAN) 0.5 MG tablet      TAKE 1 TABLET BY MOUTH TWICE A DAY   60 tablet   1     This request is for a new prescription for a contr ...   . lovastatin (MEVACOR) 40 MG tablet   Oral   Take 40 mg by mouth every evening.          . Methen-Hyosc-Meth Blue-Na Phos (UROGESIC-BLUE) 81.6 MG TABS      TAKE 1 TABLET BY MOUTH 2 (TWO) TIMES DAILY.      1   . metoprolol tartrate (LOPRESSOR) 25 MG tablet   Oral   Take 25 mg by mouth 2 (two) times daily.         . metoprolol tartrate (LOPRESSOR) 25 MG tablet      TAKE 1 TABLET (25 MG TOTAL) BY MOUTH 2 (TWO) TIMES DAILY.   60 tablet   3   . nitrofurantoin (MACRODANTIN) 100 MG capsule      TAKE 1 CAPSULE (100 MG TOTAL) BY MOUTH DAILY.      5   . omeprazole (PRILOSEC) 40 MG capsule   Oral   Take 40 mg by mouth daily.          Marland Kitchen  oxybutynin (DITROPAN) 5 MG tablet   Oral   Take 5 mg by mouth 3 (three) times daily.         . polyethylene glycol (MIRALAX / GLYCOLAX) packet   Oral    Take 17 g by mouth daily.         . QUEtiapine (SEROQUEL) 50 MG tablet   Oral   Take 50 mg by mouth at bedtime.         Marland Kitchen warfarin (COUMADIN) 5 MG tablet   Oral   Take 0.5-1 tablets (2.5-5 mg total) by mouth daily. Pt alternates between 2.5 mg and 5 mg on an every other day basis.   30 tablet   0     Allergies Codeine; Piperacillin-tazobactam in dex; Vancomycin; and Zosyn  Family History  Problem Relation Age of Onset  . Hypertension Father   . Hyperlipidemia Father   . Hypertension Mother     Social History Social History  Substance Use Topics  . Smoking status: Never Smoker   . Smokeless tobacco: None  . Alcohol Use: No    Review of Systems EM caveat patient is nonverbal  10-point ROS otherwise negative.  ____________________________________________  Danley Danker Vitals:   05/14/15 1415  BP:   Pulse: 77  Temp:   Resp:     PHYSICAL EXAM:  VITAL SIGNS: ED Triage Vitals  Enc Vitals Group     BP --      Pulse --      Resp --      Temp --      Temp src --      SpO2 --      Weight --      Height --      Head Cir --      Peak Flow --      Pain Score --      Pain Loc --      Pain Edu? --      Excl. in [place]? --    Constitutional: Patient eyes open and alert, but is nonverbal and does not follow commands. Per his family this is normal behavior for him. Eyes: Conjunctivae are normal. Head: Atraumatic. Nose: No congestion/rhinnorhea. Mouth/Throat: Mucous membranes are slightly dry.  Oropharynx non-erythematous. Neck: No stridor.   Cardiovascular: Normal rate, regular rhythm. Grossly normal heart sounds.  Good peripheral circulation. Respiratory: Normal respiratory effort.  No retractions. Scant rales in the bases bilaterally with occasional cough with rhonchi. Patient noted to have O2 saturation of 87% with good plus on room air, placed on 2 L nasal cannula satting in the mid 90s. Gastrointestinal: Soft and nontender. No distention. No abdominal bruits. No  CVA tenderness. Musculoskeletal: No lower extremity tenderness nor edema.   Neurologic:  Normal speech and language. No gross focal neurologic deficits are appreciated. No gait instability. Skin:  Skin is warm, dry and intact. Marland Kitchen Psychiatric: Mood and affect are normal. Speech and behavior are normal.  ____________________________________________   LABS (all labs ordered are listed, but only abnormal results are displayed)  Labs Reviewed  CBC WITH DIFFERENTIAL/PLATELET - Abnormal; Notable for the following:    WBC 16.7 (*)    RBC 4.17 (*)    Hemoglobin 11.8 (*)    HCT 36.4 (*)    RDW 20.6 (*)    Neutro Abs 14.3 (*)    Lymphs Abs 0.8 (*)    Monocytes Absolute 1.4 (*)    All other components within normal limits  COMPREHENSIVE METABOLIC PANEL - Abnormal;  Notable for the following:    Creatinine, Ser 0.48 (*)    All other components within normal limits  URINALYSIS COMPLETEWITH MICROSCOPIC ([facility] ONLY) - Abnormal; Notable for the following:    Color, Urine GREEN (*)    APPearance HAZY (*)    Glucose, UA   (*)    Value: TEST NOT REPORTED DUE TO COLOR INTERFERENCE OF URINE PIGMENT   Bilirubin Urine   (*)    Value: TEST NOT REPORTED DUE TO COLOR INTERFERENCE OF URINE PIGMENT   Ketones, ur   (*)    Value: TEST NOT REPORTED DUE TO COLOR INTERFERENCE OF URINE PIGMENT   Hgb urine dipstick   (*)    Value: TEST NOT REPORTED DUE TO COLOR INTERFERENCE OF URINE PIGMENT   Protein, ur   (*)    Value: TEST NOT REPORTED DUE TO COLOR INTERFERENCE OF URINE PIGMENT   Nitrite   (*)    Value: TEST NOT REPORTED DUE TO COLOR INTERFERENCE OF URINE PIGMENT   Leukocytes, UA   (*)    Value: TEST NOT REPORTED DUE TO COLOR INTERFERENCE OF URINE PIGMENT   Bacteria, UA RARE (*)    All other components within normal limits  LIPASE, BLOOD - Abnormal; Notable for the following:    Lipase 20 (*)    All other components within normal limits  URINE CULTURE  CULTURE, BLOOD (ROUTINE X 2)  CULTURE, BLOOD  (ROUTINE X 2)  TROPONIN I   ____________________________________________  EKG  Somewhat difficult baseline, likely sinus rhythm though junctional cannot be excluded. 277 QRS 100 QTc 460 No acute ischemic changes   ____________________________________________  RADIOLOGY  CT ABDOMEN AND PELVIS WITH CONTRAST  TECHNIQUE: Multidetector CT imaging of the abdomen and pelvis was performed using the standard protocol following bolus administration of intravenous contrast.  CONTRAST: 115mL OMNIPAQUE IOHEXOL 300 MG/ML SOLN  COMPARISON: 12/08/2014  FINDINGS: Lower chest: Patchy bilateral lower lobe nodular and dependent airspace consolidation is identified, right greater than left. Imaging degraded by motion and noise from body habitus. Trace pleural effusions are present.  Hepatobiliary: Unenhanced liver and gallbladder are grossly unremarkable.  Pancreas: Fatty infiltrated but unremarkable.  Spleen: Normal with the exception of a stable 1.6 cm cyst or lymphangioma/hemangioma incidentally noted, unchanged.  Adrenals/Urinary Tract: Lobular renal contour with areas of presumed cortical thinning and scarring. No hydroureteronephrosis. No radiopaque ureteral, renal, or bladder calculus. A suprapubic Foley catheter is in place and the bladder is decompressed.  Stomach/Bowel: No bowel wall thickening or focal segmental dilatation is identified. Appendix appears normal. Mild gaseous prominence of the mid colon without overt dilatation or wall thickening.  Vascular/Lymphatic: No aortic aneurysm. No lymphadenopathy.  Other: No free air or fluid. Bilateral fat containing inguinal hernias are identified.  Musculoskeletal: Mild right hip degenerative change.  IMPRESSION: No acute intra-abdominal or pelvic pathology.  Patchy right greater than left nodular and dependent airspace consolidation suspicious for pneumonia. This could account for the history of right-sided  abdominal pain if referred. ____________________________________________   PROCEDURES  Procedure(s) performed: None  Critical Care performed: No  ____________________________________________   INITIAL IMPRESSION / ASSESSMENT AND PLAN / ED COURSE  Pertinent labs & imaging results that were available during my care of the patient were reviewed by me and considered in my medical decision making (see chart for details).  Patient presents with report of a productive cough, possible right-sided pain last night. Also notable mild hypoxia with oxygen saturation 87% on room air while initially evaluating him. Concern for  possible intra-abdominal pathology, recurrent urinary tract infection, pneumonia. EKG is reassuring.  After evaluation, CT of the abdomen demonstrates what appears to be bilateral consolidation suspicious for pneumonia. In the setting of his hypoxia and report a cough from his family, this is likely representative pneumonia. We'll admit him to the hospital for ongoing care. Discussed antibiotic choice the hospitalist, because of his multiple drug allergies we will admit him and initiate treatment with azithromycin and Levaquin. Blood cultures pending. Does appear to have possible infection the urinary tract, but he is also chronically catheterized and urine culture sent.  ----------------------------------------- 2:40 PM on 05/14/2015 -----------------------------------------  Updated patient's father at bedside. We'll initiate antibiotic's. Patient does tolerate cyclosporins. He is unfortunately listed as allergic to vancomycin and Zosyn. We'll initiate azithromycin and cephalosportin. ____________________________________________   FINAL CLINICAL IMPRESSION(S) / ED DIAGNOSES  Final diagnoses:  Bilateral pneumonia  Hypoxia  Catheter-associated urinary tract infection      Delman Kitten, MD 05/14/15 1440

## 2015-05-14 NOTE — Progress Notes (Signed)
ANTIBIOTIC CONSULT NOTE - INITIAL  Pharmacy Consult for Meropenem Indication: aspiration pneumonia  Allergies  Allergen Reactions  . Codeine Nausea Only  . Piperacillin-Tazobactam In Dex Rash and Other (See Comments)    Arm erythema on administration, but tolerated cephalosporins fine.    . Vancomycin Rash and Other (See Comments)    Reaction:  Red man syndrome   . Zosyn [Piperacillin Sod-Tazobactam So] Rash and Other (See Comments)    Pt experienced arm erythema on administration, but tolerated cephalosporins fine.      Patient Measurements: Height: 5\' 11"  (180.3 cm) Weight: 291 lb 0.1 oz (132 kg) IBW/kg (Calculated) : 75.3 Adjusted Body Weight:   Vital Signs: Temp: 97.8 F (36.6 C) (09/02 1659) Temp Source: Oral (09/02 1659) BP: 132/86 mmHg (09/02 1659) Pulse Rate: 77 (09/02 1659) Intake/Output from previous day:   Intake/Output from this shift: Total I/O In: 0  Out: 650 [Urine:650]  Labs:  Recent Labs  05/14/15 1209  WBC 16.7*  HGB 11.8*  PLT 183  CREATININE 0.48*   Estimated Creatinine Clearance: 154.8 mL/min (by C-G formula based on Cr of 0.48). No results for input(s): VANCOTROUGH, VANCOPEAK, VANCORANDOM, GENTTROUGH, GENTPEAK, GENTRANDOM, TOBRATROUGH, TOBRAPEAK, TOBRARND, AMIKACINPEAK, AMIKACINTROU, AMIKACIN in the last 72 hours.   Microbiology: No results found for this or any previous visit (from the past 720 hour(s)).  Medical History: Past Medical History  Diagnosis Date  . Frontotemporal dementia   . Sleep apnea   . Hypertension   . Depression   . Neurogenic bladder   . Myelopathy   . Obesity   . Degeneration, intervertebral disc, cervical   . Elevated liver function tests   . Pulmonary embolus     on chronic anticoagulation  . Chronic UTI (urinary tract infection)   . Pernicious anemia   . Rosacea   . BPH (benign prostatic hypertrophy)   . History of DVT (deep vein thrombosis)   . History of pulmonary embolism     currently on  coumadin  . Anxiety   . New onset a-fib 2013    Medications:  Prescriptions prior to admission  Medication Sig Dispense Refill Last Dose  . benzonatate (TESSALON) 100 MG capsule Take 100 mg by mouth every 8 (eight) hours as needed for cough.   05/13/2015 at Unknown time  . citalopram (CELEXA) 20 MG tablet Take 20 mg by mouth 2 (two) times daily.   05/14/2015 at Unknown time  . clindamycin (CLEOCIN T) 1 % external solution Apply 1 application topically 2 (two) times daily. Pt applies to scalp.   05/13/2015 at Unknown time  . cyanocobalamin (,VITAMIN B-12,) 1000 MCG/ML injection Inject 500 mcg into the muscle every 30 (thirty) days.   unknown at unknown  . dextromethorphan (DELSYM) 30 MG/5ML liquid Take 90 mg by mouth 2 (two) times daily as needed for cough.    05/14/2015 at 0900  . flecainide (TAMBOCOR) 50 MG tablet Take 50 mg by mouth 2 (two) times daily.   05/14/2015 at Unknown time  . fluticasone (FLONASE) 50 MCG/ACT nasal spray Place 1 spray into both nostrils 2 (two) times daily.   05/14/2015 at Unknown time  . gluconic acid-citric acid (RENACIDIN) irrigation Irrigate with 30 mLs as directed 3 (three) times daily.   05/14/2015 at Unknown time  . guaiFENesin (MUCINEX) 600 MG 12 hr tablet Take 600 mg by mouth 2 (two) times daily.    05/14/2015 at Unknown time  . HYDROcodone-acetaminophen (NORCO/VICODIN) 5-325 MG per tablet Take 1-2 tablets by mouth every  6 (six) hours as needed for moderate pain.   05/14/2015 at 0615  . ipratropium-albuterol (DUONEB) 0.5-2.5 (3) MG/3ML SOLN Take 3 mLs by nebulization 4 (four) times daily as needed (for shortness of breath).   05/14/2015 at Unknown time  . ketoconazole (NIZORAL) 2 % shampoo Apply 1 application topically once a week. Pt uses on Friday.   05/07/2015 at unknown  . loratadine (CLARITIN) 10 MG tablet Take 10 mg by mouth at bedtime.    05/13/2015 at Unknown time  . LORazepam (ATIVAN) 0.5 MG tablet Take 0.5 mg by mouth 2 (two) times daily.   05/14/2015 at Unknown time  .  lovastatin (MEVACOR) 40 MG tablet Take 40 mg by mouth every evening.    05/13/2015 at Unknown time  . Methen-Hyosc-Meth Blue-Na Phos (UROGESIC-BLUE) 81.6 MG TABS Take 1 tablet by mouth 2 (two) times daily.   05/14/2015 at Unknown time  . metoprolol tartrate (LOPRESSOR) 25 MG tablet Take 12.5 mg by mouth daily at 12 noon.    05/13/2015 at 1200  . nitrofurantoin (MACRODANTIN) 100 MG capsule Take 100 mg by mouth every evening.    05/13/2015 at Unknown time  . omeprazole (PRILOSEC) 40 MG capsule Take 40 mg by mouth daily.    05/13/2015 at Unknown time  . oxybutynin (DITROPAN) 5 MG tablet Take 5 mg by mouth 3 (three) times daily.   05/14/2015 at Unknown time  . polyethylene glycol (MIRALAX / GLYCOLAX) packet Take 17 g by mouth daily.   05/14/2015 at Unknown time  . QUEtiapine (SEROQUEL) 50 MG tablet Take 50 mg by mouth at bedtime.   05/13/2015 at Unknown time  . warfarin (COUMADIN) 5 MG tablet Take 0.5-1 tablets (2.5-5 mg total) by mouth daily. Pt alternates between 2.5 mg and 5 mg on an every other day basis. 30 tablet 0 05/13/2015 at 1600  . clotrimazole-betamethasone (LOTRISONE) cream APPLY TO WOUND TWICE A DAY FOR 10 DAYS (Patient not taking: Reported on 03/22/2015) 45 g 0    Assessment: CrCl = 154.8 ml/min PCN allergy (rash)  Goal of Therapy:  resolution of infection   Plan:  Will order Meropenem 500 mg IV Q8H to start 9/2.   Dejanira Pamintuan D 05/14/2015,6:48 PM

## 2015-05-15 LAB — PROTIME-INR
INR: 2.5
PROTHROMBIN TIME: 27.1 s — AB (ref 11.4–15.0)

## 2015-05-15 LAB — CBC
HCT: 36.7 % — ABNORMAL LOW (ref 40.0–52.0)
Hemoglobin: 11.9 g/dL — ABNORMAL LOW (ref 13.0–18.0)
MCH: 28.1 pg (ref 26.0–34.0)
MCHC: 32.4 g/dL (ref 32.0–36.0)
MCV: 86.8 fL (ref 80.0–100.0)
Platelets: 185 10*3/uL (ref 150–440)
RBC: 4.23 MIL/uL — ABNORMAL LOW (ref 4.40–5.90)
RDW: 20.8 % — AB (ref 11.5–14.5)
WBC: 16.8 10*3/uL — ABNORMAL HIGH (ref 3.8–10.6)

## 2015-05-15 MED ORDER — IPRATROPIUM-ALBUTEROL 0.5-2.5 (3) MG/3ML IN SOLN
3.0000 mL | Freq: Four times a day (QID) | RESPIRATORY_TRACT | Status: DC
  Administered 2015-05-15 – 2015-05-19 (×14): 3 mL via RESPIRATORY_TRACT
  Filled 2015-05-15 (×15): qty 3

## 2015-05-15 MED ORDER — ACETAMINOPHEN 325 MG PO TABS
650.0000 mg | ORAL_TABLET | Freq: Four times a day (QID) | ORAL | Status: DC | PRN
Start: 2015-05-15 — End: 2015-05-15
  Filled 2015-05-15: qty 2

## 2015-05-15 MED ORDER — BUDESONIDE 0.25 MG/2ML IN SUSP
0.2500 mg | Freq: Two times a day (BID) | RESPIRATORY_TRACT | Status: DC
  Administered 2015-05-15 – 2015-05-19 (×6): 0.25 mg via RESPIRATORY_TRACT
  Filled 2015-05-15 (×9): qty 2

## 2015-05-15 MED ORDER — ACETAMINOPHEN 325 MG PO TABS
650.0000 mg | ORAL_TABLET | Freq: Four times a day (QID) | ORAL | Status: DC | PRN
  Administered 2015-05-19: 650 mg via ORAL
  Filled 2015-05-15: qty 2

## 2015-05-15 MED ORDER — ALPRAZOLAM 0.25 MG PO TABS: 0.2500 mg | ORAL_TABLET | Freq: Three times a day (TID) | ORAL | Status: DC | PRN

## 2015-05-15 MED ORDER — LORAZEPAM 2 MG/ML IJ SOLN
0.5000 mg | Freq: Two times a day (BID) | INTRAMUSCULAR | Status: DC
  Administered 2015-05-15 – 2015-05-19 (×7): 0.5 mg via INTRAVENOUS
  Filled 2015-05-15 (×8): qty 1

## 2015-05-15 MED ORDER — CITRIC ACID-D GLUCONIC ACID IR SOLN
30.0000 mL | Freq: Three times a day (TID) | Status: DC
  Administered 2015-05-15 – 2015-05-19 (×12): 30 mL
  Filled 2015-05-15 (×3): qty 500

## 2015-05-15 MED ORDER — ACETAMINOPHEN 650 MG RE SUPP
650.0000 mg | Freq: Four times a day (QID) | RECTAL | Status: DC | PRN
Start: 2015-05-15 — End: 2015-05-19
  Administered 2015-05-15 – 2015-05-16 (×2): 650 mg via RECTAL
  Filled 2015-05-15 (×2): qty 1

## 2015-05-15 MED ORDER — MEROPENEM 1 G IV SOLR
1.0000 g | Freq: Three times a day (TID) | INTRAVENOUS | Status: DC
  Administered 2015-05-15 – 2015-05-19 (×12): 1 g via INTRAVENOUS
  Filled 2015-05-15 (×16): qty 1

## 2015-05-15 NOTE — Plan of Care (Signed)
Problem: Discharge Progression Outcomes Goal: Other Discharge Outcomes/Goals Outcome: Progressing Plan of care progress to goal: PNA: Pt spiked fever this afternoon. IV ABX infusing. IVF infusing. Made NPO this afternoon due to patient aspirating after lunch. Pt would hold food in mouth and not swallow. MD notified. No c/o pain. On 3L O2.

## 2015-05-15 NOTE — Plan of Care (Signed)
Problem: Discharge Progression Outcomes Goal: Other Discharge Outcomes/Goals Outcome: Progressing Discharge plan : plan to discharge home with parents when able  02 sats: pt  Not on oxygen at home here was placed on 2 L attempts to wean though out night  Pain:no complaints of pain, pt moaning throughout night, per father these are normal noises for pateint and he does not appear to be in any distress  Diet: pt is a feeder when family not at bedside, eats well

## 2015-05-15 NOTE — Progress Notes (Signed)
Initial Nutrition Assessment    INTERVENTION:   Meals and Snacks: Cater to patient preferences,discussed preferences with mother and father; likes eggs at breakfast, yogurt as afternoon snack.  Medical Food Supplement Therapy: will add Magic Cup as pt wanting ice cream and po intake/appetite down at present Feeding Assistance: pt requires feeding assistance at meal times  NUTRITION DIAGNOSIS:   Inadequate oral intake related to dysphagia, poor appetite as evidenced by meal completion < 25%.  GOAL:   Patient will meet greater than or equal to 90% of their needs  MONITOR:    (Energy Intake, Digestive System, Anthropometrics, Electrolyte/Renal Profile)  REASON FOR ASSESSMENT:   Diagnosis (Dysphagia Diet)    ASSESSMENT:    Pt admitted with pneumonia, likely aspiration; pt moaning  Past Medical History  Diagnosis Date  . Frontotemporal dementia   . Sleep apnea   . Hypertension   . Depression   . Neurogenic bladder   . Myelopathy   . Obesity   . Degeneration, intervertebral disc, cervical   . Elevated liver function tests   . Pulmonary embolus     on chronic anticoagulation  . Chronic UTI (urinary tract infection)   . Pernicious anemia   . Rosacea   . BPH (benign prostatic hypertrophy)   . History of DVT (deep vein thrombosis)   . History of pulmonary embolism     currently on coumadin  . Anxiety   . New onset a-fib 2013     Diet Order:  DIET - DYS 1 Room service appropriate?: Yes; Fluid consistency:: Nectar Thick; this is pt's baseline diet. SLP is following  Energy Intake: recorded po intake 0% at dinner last night, took bites at breakfast this AM  Food and Nutrition Related History: family reports appetite has been declining, despite this pt still eats 2 good meals per day and an afternoon snack of yogurt daily  Skin: no pressure ulcers noted  Electrolyte and Renal Profile:  Recent Labs Lab 05/14/15 1209  BUN 12  CREATININE 0.48*  NA 136  K 3.7    Glucose Profile: No results for input(s): GLUCAP in the last 72 hours. Protein Profile:  Recent Labs Lab 05/14/15 1209  ALBUMIN 3.5   Meds: NS at 75 ml/hr  Height:   Ht Readings from Last 1 Encounters:  05/14/15 5\' 11"  (1.803 m)    Weight: family reports no wt loss  Wt Readings from Last 1 Encounters:  05/14/15 291 lb 0.1 oz (132 kg)   Wt Readings from Last 10 Encounters:  05/14/15 291 lb 0.1 oz (132 kg)  03/22/15 285 lb 11.2 oz (129.593 kg)  02/24/15 292 lb 6.4 oz (132.632 kg)  01/31/15 287 lb (130.182 kg)  01/19/15 290 lb (131.543 kg)  08/18/14 280 lb (127.007 kg)  10/16/13 285 lb (129.275 kg)     BMI:  Body mass index is 40.61 kg/(m^2).  LOW Care Level  Kerman Passey MS, [place], LDN [phone] Pager

## 2015-05-15 NOTE — Progress Notes (Signed)
While performing an assessment on patient before starting patient's breathing treatment I noticed patient seemed to be making verbal gurgle noises from back of throat.  Upon taking the patient's saturations I noted they were lower than previous.  I called the patient's nurse and told her I was going to suction out the back of the patient's throat to see if he had liquids or food in the back of his throat.  I got back thick liquid while trying to see if I could get the patient to cough for me.  He was unable to create a good cough for me but his saturations became better and his breath sounds were increased aeration.

## 2015-05-15 NOTE — Evaluation (Signed)
Clinical/Bedside Swallow Evaluation Patient Details  Name: Kent [name] MRN: [id number] Date of Birth: 01/03/1966  Today's Date: 05/15/2015 Time: SLP Start Time (ACUTE ONLY): 0800 SLP Stop Time (ACUTE ONLY): 0900 SLP Time Calculation (min) (ACUTE ONLY): 60 min  Past Medical History:  Past Medical History  Diagnosis Date  . Frontotemporal dementia   . Sleep apnea   . Hypertension   . Depression   . Neurogenic bladder   . Myelopathy   . Obesity   . Degeneration, intervertebral disc, cervical   . Elevated liver function tests   . Pulmonary embolus     on chronic anticoagulation  . Chronic UTI (urinary tract infection)   . Pernicious anemia   . Rosacea   . BPH (benign prostatic hypertrophy)   . History of DVT (deep vein thrombosis)   . History of pulmonary embolism     currently on coumadin  . Anxiety   . New onset a-fib 2013   Past Surgical History:  Past Surgical History  Procedure Laterality Date  . Suprapubic catheter insertion    . Kidney stone surgery     HPI:  Pt is a 49 y.o. male with a known history of *under temporal dementia, neurogenic bladder with indwelling suprapubic catheter, recent multidrug-resistant UTI, chronic UTIs, HTN, Obesity, GERD on PPI, sleep apnea, anxiety and other medical issues. Pt is nonverbal but communicates w/ phonations; he requires total assist w/ ADLs. Pt has been seen by ST services in the past and dx'd w/ Dysphagia and placed on a Dys. 1 w/ Nectar liquids diet. Father stated they give him thickened liquids at home; requires feeding. Pt admitted w/ pain starting last night in the upper right abd area. CT scan shows right sided pneumonia. Noted pt presented w/ congested respirations at baseline prior to po's.    Assessment / Plan / Recommendation Clinical Impression  Pt presents w/ moderate+ oropharyngeal phase dysphagia and appears at increased risk for aspiration sec. to pt's declined Cognitive status(baseline) as well as his  declined medical and respiratory status. Pt presents w/ decreased awareness overall during taking boluses from utensil, oral management and transfer for swallowing, and fully clearing bolus material post swallowing. Due to pt's presentation, rec. continue the rec'd diet of Dys. 1 w/ Nectar liquids w/ strict aspiration precautions and feeding assistance. Rec. meds crushed in puree; if any suspected aspiration w/ po's per NSG, then stop feeding. NSG consulted and agreed.     Aspiration Risk  Moderate    Diet Recommendation Dysphagia 1 (Puree);Nectar   Medication Administration: Crushed with puree (as able) Compensations: Minimize environmental distractions;Slow rate;Small sips/bites;Multiple dry swallows after each bite/sip;Check for pocketing    Other  Recommendations Recommended Consults:  (Dietician) Oral Care Recommendations: Oral care BID;Oral care before and after PO;Staff/trained caregiver to provide oral care Other Recommendations: Order thickener from pharmacy;Prohibited food (jello, ice cream, thin soups);Remove water pitcher   Follow Up Recommendations       Frequency and Duration min 3x week  1 week   Pertinent Vitals/Pain None noted during interaction    SLP Swallow Goals  see care plan   Swallow Study Prior Functional Status   total assistance w/ all ADLs.    General Date of Onset: 05/14/15 Other Pertinent Information: Pt is a 49 y.o. male with a known history of *under temporal dementia, neurogenic bladder with indwelling suprapubic catheter, recent multidrug-resistant UTI, chronic UTIs, HTN, Obesity, GERD on PPI, sleep apnea, anxiety and other medical issues. Pt is nonverbal but  communicates w/ phonations; he requires total assist w/ ADLs. Pt has been seen by ST services in the past and dx'd w/ Dysphagia and placed on a Dys. 1 w/ Nectar liquids diet. Father stated they give him thickened liquids at home; requires feeding. Pt admitted w/ pain starting last night in the  upper right abd area. CT scan shows right sided pneumonia. Noted pt presented w/ congested respirations at baseline prior to po's.  Type of Study: Bedside swallow evaluation Previous Swallow Assessment: previous admission Diet Prior to this Study: Nectar-thick liquids (unsure of food consistency) Temperature Spikes Noted: No (wbc elevated 16.8) Respiratory Status: Room air History of Recent Intubation: No Behavior/Cognition: Alert;Confused;Requires cueing;Distractible;Doesn't follow directions;Pleasant mood Oral Cavity - Dentition: Adequate natural dentition/normal for age Self-Feeding Abilities: Total assist Patient Positioning: Upright in bed Baseline Vocal Quality:  (phonations clear and w/ adequate volume) Volitional Cough: Cognitively unable to elicit Volitional Swallow: Unable to elicit    Oral/Motor/Sensory Function Overall Oral Motor/Sensory Function:  (unable to fully assess sec. to declined Cognitive status)   Ice Chips Ice chips: Not tested   Thin Liquid Thin Liquid: Not tested    Nectar Thick Nectar Thick Liquid: Impaired Presentation: Spoon (fed; 8 trials total of juices) Oral Phase Impairments: Reduced labial seal;Reduced lingual movement/coordination;Poor awareness of bolus;Impaired anterior to posterior transit Oral phase functional implications: Prolonged oral transit;Oral residue (diffuse - min.; anterior spillage x2. ) Pharyngeal Phase Impairments:  (none w/ trials)   Honey Thick Honey Thick Liquid: Not tested   Puree Puree: Impaired Presentation: Spoon (fed; 1 trial) Oral Phase Impairments: Reduced labial seal;Reduced lingual movement/coordination;Poor awareness of bolus;Impaired anterior to posterior transit Oral Phase Functional Implications: Prolonged oral transit;Oral residue (diffuse) Pharyngeal Phase Impairments:  (none w/ trial) Other Comments: no other trials given sec. to pt recently given his PPI - Father requested waiting 30-45 mins. for the "medicine to  work" as they do at home.    Solid   GO    Solid: Not tested      Orinda Kenner, MS, CCC-SLP  [name],[name] 05/15/2015,11:15 AM

## 2015-05-15 NOTE — Progress Notes (Signed)
Patient ID: Kent [name], male   DOB: 14-Apr-1966, 49 y.o.   MRN: [id number] [facility] PROGRESS NOTE  PCP: Ashok Norris, MD  HPI/Subjective: Patient moaned when I came in the room  Objective: Filed Vitals:   05/15/15 0511  BP: 137/79  Pulse: 99  Temp: 99 F (37.2 C)  Resp: 20    Filed Weights   05/14/15 1203  Weight: 132 kg (291 lb 0.1 oz)    ROS: Review of Systems  Unable to perform ROS Secondary to mental status Exam: Physical Exam  HENT:  Nose: No mucosal edema.  Mouth/Throat: No oropharyngeal exudate or posterior oropharyngeal edema.  Eyes: Conjunctivae, EOM and lids are normal. Pupils are equal, round, and reactive to light.  Neck: No JVD present. Carotid bruit is not present. No edema present. No thyroid mass and no thyromegaly present.  Cardiovascular: S1 normal and S2 normal.  Exam reveals no gallop.   No murmur heard. Pulses:      Dorsalis pedis pulses are 2+ on the right side, and 2+ on the left side.  Respiratory: No respiratory distress. He has wheezes in the right lower field. He has rhonchi in the right middle field, the right lower field and the left lower field. He has no rales.  GI: Soft. Bowel sounds are normal. He exhibits distension. There is no tenderness.  Musculoskeletal:       Right ankle: He exhibits swelling.       Left ankle: He exhibits swelling.  Lymphadenopathy:    He has no cervical adenopathy.  Neurological: He is alert.  Skin: Skin is warm. No rash noted. Nails show no clubbing.  Psychiatric: His mood appears anxious.    Data Reviewed: Basic Metabolic Panel:  Recent Labs Lab 05/14/15 1209  NA 136  K 3.7  CL 102  CO2 27  GLUCOSE 99  BUN 12  CREATININE 0.48*  CALCIUM 9.2   Liver Function Tests:  Recent Labs Lab 05/14/15 1209  AST 32  ALT 38  ALKPHOS 84  BILITOT 1.0  PROT 7.1  ALBUMIN 3.5    Recent Labs Lab 05/14/15 1209  LIPASE 20*   CBC:  Recent Labs Lab 05/14/15 1209  05/15/15 0454  WBC 16.7* 16.8*  NEUTROABS 14.3*  --   HGB 11.8* 11.9*  HCT 36.4* 36.7*  MCV 87.2 86.8  PLT 183 185     Recent Results (from the past 240 hour(s))  Urine culture     Status: None (Preliminary result)   Collection Time: 05/14/15 12:39 PM  Result Value Ref Range Status   Specimen Description URINE, CLEAN CATCH  Final   Special Requests Normal  Final   Culture   Final    >=100,000 COLONIES/mL GRAM NEGATIVE RODS IDENTIFICATION AND SUSCEPTIBILITIES TO FOLLOW    Report Status PENDING  Incomplete  Culture, blood (routine x 2)     Status: None (Preliminary result)   Collection Time: 05/14/15  3:21 PM  Result Value Ref Range Status   Specimen Description BLOOD LEFT FOREARM  Final   Special Requests   Final    BOTTLES DRAWN AEROBIC AND ANAEROBIC  AER 2CC ANA 4CC   Culture NO GROWTH < 24 HOURS  Final   Report Status PENDING  Incomplete  Culture, blood (routine x 2)     Status: None (Preliminary result)   Collection Time: 05/14/15  3:21 PM  Result Value Ref Range Status   Specimen Description BLOOD RIGHT ASSIST CONTROL  Final   Special  Requests   Final    BOTTLES DRAWN AEROBIC AND ANAEROBIC  AER 12 CC ANA [place]   Culture NO GROWTH < 24 HOURS  Final   Report Status PENDING  Incomplete     Studies: Ct Abdomen Pelvis W Contrast  05/14/2015   CLINICAL DATA:  Right-sided abdominal pain  EXAM: CT ABDOMEN AND PELVIS WITH CONTRAST  TECHNIQUE: Multidetector CT imaging of the abdomen and pelvis was performed using the standard protocol following bolus administration of intravenous contrast.  CONTRAST:  164mL OMNIPAQUE IOHEXOL 300 MG/ML  SOLN  COMPARISON:  12/08/2014  FINDINGS: Lower chest: Patchy bilateral lower lobe nodular and dependent airspace consolidation is identified, right greater than left. Imaging degraded by motion and noise from body habitus. Trace pleural effusions are present.  Hepatobiliary: Unenhanced liver and gallbladder are grossly unremarkable.  Pancreas: Fatty  infiltrated but unremarkable.  Spleen: Normal with the exception of a stable 1.6 cm cyst or lymphangioma/hemangioma incidentally noted, unchanged.  Adrenals/Urinary Tract: Lobular renal contour with areas of presumed cortical thinning and scarring. No hydroureteronephrosis. No radiopaque ureteral, renal, or bladder calculus. A suprapubic Foley catheter is in place and the bladder is decompressed.  Stomach/Bowel: No bowel wall thickening or focal segmental dilatation is identified. Appendix appears normal. Mild gaseous prominence of the mid colon without overt dilatation or wall thickening.  Vascular/Lymphatic: No aortic aneurysm.  No lymphadenopathy.  Other: No free air or fluid. Bilateral fat containing inguinal hernias are identified.  Musculoskeletal: Mild right hip degenerative change.  IMPRESSION: No acute intra-abdominal or pelvic pathology.  Patchy right greater than left nodular and dependent airspace consolidation suspicious for pneumonia. This could account for the history of right-sided abdominal pain if referred.   Electronically Signed   By: Conchita Paris M.D.   On: 05/14/2015 14:16   Dg Chest Port 1 View  05/14/2015   CLINICAL DATA:  Right-sided pain.  Recent urinary tract infection  EXAM: PORTABLE CHEST - 1 VIEW  COMPARISON:  March 22, 2015  FINDINGS: There is scarring in the right base region. There is no edema or consolidation. Heart is upper normal in size with pulmonary vascularity within normal limits. No adenopathy. No bone lesions.  IMPRESSION: Scarring right base region. No edema or consolidation. No change in cardiac silhouette.   Electronically Signed   By: Lowella Grip [name] M.D.   On: 05/14/2015 13:10    Scheduled Meds: . budesonide (PULMICORT) nebulizer solution  0.25 mg Nebulization BID  . citalopram  20 mg Oral BID  . clindamycin  1 application Topical BID  . cyanocobalamin  500 mcg Intramuscular Q30 days  . flecainide  50 mg Oral BID  . fluticasone  1 spray Each Nare BID   . gluconic acid-citric acid  30 mL Irrigation TID  . guaiFENesin  600 mg Oral BID  . ipratropium-albuterol  3 mL Nebulization Q6H  . ketoconazole  1 application Topical Q Fri  . levofloxacin (LEVAQUIN) IV  500 mg Intravenous Q24H  . loratadine  10 mg Oral QHS  . LORazepam  0.5 mg Oral BID  . meropenem (MERREM) IV  1 g Intravenous 3 times per day  . metoprolol tartrate  12.5 mg Oral Q1200  . oxybutynin  5 mg Oral TID  . pantoprazole  40 mg Oral Daily  . polyethylene glycol  17 g Oral Daily  . pravastatin  10 mg Oral q1800  . QUEtiapine  50 mg Oral QHS  . UROGESIC-BLUE  1 tablet Oral BID  . warfarin  2.5 mg Oral QODAY   And  . warfarin  5 mg Oral QODAY  . Warfarin - Physician Dosing Inpatient   Does not apply q1800   Continuous Infusions: . sodium chloride 75 mL/hr at 05/14/15 1844    Assessment/Plan:  1. Pneumonia bilaterally worse on the right than the left. Leukocytosis. Patient on aggressive antibiotics with meropenem and Levaquin. Potential aspiration. Add budesonide nebulizers and DuoNeb nebulizer. 2. Chronic urinary tract infection with suprapubic catheter. Gram-negative rods growing out of culture continue antibiotics for right now. Likely will need catheter change while here. 3. History of pulmonary embolism- INR therapeutic 4. History of frontal temporal dementia 5. Essential hypertension- continue usual medications 6. Sleep apnea obesity  Code Status:     Code Status Orders        Start     Ordered   05/14/15 1724  Full code   Continuous     05/14/15 1723    Advance Directive Documentation        Most Recent Value   Type of Advance Directive  Healthcare Power of Attorney, Living will   Pre-existing out of facility DNR order (yellow form or pink MOST form)     "MOST" Form in Place?       Family Communication: Family at bedside Disposition Plan: Home once better  Time spent: 30 minutes  [place], [place]  [facility]

## 2015-05-15 NOTE — Progress Notes (Signed)
ANTIBIOTIC CONSULT NOTE - INITIAL  Pharmacy Consult for Meropenem Indication: aspiration pneumonia  Allergies  Allergen Reactions  . Codeine Nausea Only  . Piperacillin-Tazobactam In Dex Rash and Other (See Comments)    Arm erythema on administration, but tolerated cephalosporins fine.    . Vancomycin Rash and Other (See Comments)    Reaction:  Red man syndrome   . Zosyn [Piperacillin Sod-Tazobactam So] Rash and Other (See Comments)    Pt experienced arm erythema on administration, but tolerated cephalosporins fine.      Patient Measurements: Height: 5\' 11"  (180.3 cm) Weight: 291 lb 0.1 oz (132 kg) IBW/kg (Calculated) : 75.3  Vital Signs: Temp: 99 F (37.2 C) (09/03 0511) Temp Source: Oral (09/03 0511) BP: 137/79 mmHg (09/03 0511) Pulse Rate: 99 (09/03 0511) Labs:  Recent Labs  05/14/15 1209 05/15/15 0454  WBC 16.7* 16.8*  HGB 11.8* 11.9*  PLT 183 185  CREATININE 0.48*  --    Estimated Creatinine Clearance: 154.8 mL/min (by C-G formula based on Cr of 0.48). No results for input(s): VANCOTROUGH, VANCOPEAK, VANCORANDOM, GENTTROUGH, GENTPEAK, GENTRANDOM, TOBRATROUGH, TOBRAPEAK, TOBRARND, AMIKACINPEAK, AMIKACINTROU, AMIKACIN in the last 72 hours.   Microbiology: Recent Results (from the past 720 hour(s))  Urine culture     Status: None (Preliminary result)   Collection Time: 05/14/15 12:39 PM  Result Value Ref Range Status   Specimen Description URINE, CLEAN CATCH  Final   Special Requests Normal  Final   Culture   Final    >=100,000 COLONIES/mL GRAM NEGATIVE RODS IDENTIFICATION AND SUSCEPTIBILITIES TO FOLLOW    Report Status PENDING  Incomplete  Culture, blood (routine x 2)     Status: None (Preliminary result)   Collection Time: 05/14/15  3:21 PM  Result Value Ref Range Status   Specimen Description BLOOD LEFT FOREARM  Final   Special Requests   Final    BOTTLES DRAWN AEROBIC AND ANAEROBIC  AER 2CC ANA 4CC   Culture NO GROWTH < 24 HOURS  Final   Report Status  PENDING  Incomplete  Culture, blood (routine x 2)     Status: None (Preliminary result)   Collection Time: 05/14/15  3:21 PM  Result Value Ref Range Status   Specimen Description BLOOD RIGHT ASSIST CONTROL  Final   Special Requests   Final    BOTTLES DRAWN AEROBIC AND ANAEROBIC  AER 12 CC ANA [place]   Culture NO GROWTH < 24 HOURS  Final   Report Status PENDING  Incomplete    Medical History: Past Medical History  Diagnosis Date  . Frontotemporal dementia   . Sleep apnea   . Hypertension   . Depression   . Neurogenic bladder   . Myelopathy   . Obesity   . Degeneration, intervertebral disc, cervical   . Elevated liver function tests   . Pulmonary embolus     on chronic anticoagulation  . Chronic UTI (urinary tract infection)   . Pernicious anemia   . Rosacea   . BPH (benign prostatic hypertrophy)   . History of DVT (deep vein thrombosis)   . History of pulmonary embolism     currently on coumadin  . Anxiety   . New onset a-fib 2013    Medications:  Anti-infectives    Start     Dose/Rate Route Frequency Ordered Stop   05/15/15 1400  meropenem (MERREM) 1 g in sodium chloride 0.9 % 100 mL IVPB     1 g 200 mL/hr over 30 Minutes Intravenous 3 times per  day 05/15/15 1007     05/14/15 1745  meropenem (MERREM) 500 mg in sodium chloride 0.9 % 50 mL IVPB  Status:  Discontinued     500 mg 100 mL/hr over 30 Minutes Intravenous 3 times per day 05/14/15 1742 05/15/15 1007   05/14/15 1730  levofloxacin (LEVAQUIN) IVPB 500 mg     500 mg 100 mL/hr over 60 Minutes Intravenous Every 24 hours 05/14/15 1723     05/14/15 1445  azithromycin (ZITHROMAX) 500 mg in dextrose 5 % 250 mL IVPB  Status:  Discontinued     500 mg 250 mL/hr over 60 Minutes Intravenous  Once 05/14/15 1439 05/14/15 1734   05/14/15 1445  cefTAZidime (FORTAZ) 1 g in dextrose 5 % 50 mL IVPB     1 g 100 mL/hr over 30 Minutes Intravenous  Once 05/14/15 1439 05/14/15 1611      Assessment: CrCl = 154.8 ml/min PCN allergy  (rash)  Goal of Therapy:  resolution of infection   Plan:  Current orders for meropenem 500mg  IV Q8H. Patient with multiple hospital admissions in past 6 months, frequent healthcare contact. Will change to meropenem 1gm IV Q8H for HCAP/apsiration pneumonia.  Patient also ordered levofloxacin 500mg  IV Q8H  Rexene Edison, PharmD Clinical Pharmacist  05/15/2015,10:07 AM

## 2015-05-16 DIAGNOSIS — N319 Neuromuscular dysfunction of bladder, unspecified: Secondary | ICD-10-CM

## 2015-05-16 DIAGNOSIS — Z435 Encounter for attention to cystostomy: Secondary | ICD-10-CM

## 2015-05-16 LAB — URINE CULTURE: SPECIAL REQUESTS: NORMAL

## 2015-05-16 LAB — PROTIME-INR
INR: 1.78
PROTHROMBIN TIME: 20.9 s — AB (ref 11.4–15.0)

## 2015-05-16 MED ORDER — METHYLPREDNISOLONE SODIUM SUCC 125 MG IJ SOLR
60.0000 mg | INTRAMUSCULAR | Status: DC
  Administered 2015-05-16 – 2015-05-18 (×6): 60 mg via INTRAVENOUS
  Filled 2015-05-16 (×6): qty 2

## 2015-05-16 NOTE — Plan of Care (Addendum)
Problem: Discharge Progression Outcomes Goal: Other Discharge Outcomes/Goals Outcome: Progressing No signs of pain using this shift. No signs of anxiety. Pt had low grade fever this afternoon, MD aware, tylenol suppository given.  IVF and IV ABX infusing. Suprapubic catheter changed by Dr. Jeffie Pollock. Patient remains NPO. O2 stats remain at 92-94% on 4L [place]. Patient continues to have crackles and rhonchi. Solumedrol ordered today. Patient on bedrest. Turned every two hours and heels floated. Tolerating well.

## 2015-05-16 NOTE — Consult Note (Signed)
Subjective: I was asked to see Mr. Kent [name] by Dr. Leslye Peer for SP tube replacement.   Mr. Bourcier is a 49 yo WM with frontotemporal dementia, myelopathy and a neurogenic bladder that is managed with SP tube drainage and botox injections.  He is a patient of Dr. Edrick Oh.   He is admitted with a pneumonia but I was requested to exchange the tube for possible UTI.  It was last changed 2 weeks ago.  He is on warfarin for history of DVT and PE.  He has a history of stones but none were seen on CT AP this admission.  ROS:  Review of Systems  Unable to perform ROS: dementia    Allergies  Allergen Reactions  . Codeine Nausea Only  . Piperacillin-Tazobactam In Dex Rash and Other (See Comments)    Arm erythema on administration, but tolerated cephalosporins fine.    . Vancomycin Rash and Other (See Comments)    Reaction:  Red man syndrome   . Zosyn [Piperacillin Sod-Tazobactam So] Rash and Other (See Comments)    Pt experienced arm erythema on administration, but tolerated cephalosporins fine.      Past Medical History  Diagnosis Date  . Frontotemporal dementia   . Sleep apnea   . Hypertension   . Depression   . Neurogenic bladder   . Myelopathy   . Obesity   . Degeneration, intervertebral disc, cervical   . Elevated liver function tests   . Pulmonary embolus     on chronic anticoagulation  . Chronic UTI (urinary tract infection)   . Pernicious anemia   . Rosacea   . BPH (benign prostatic hypertrophy)   . History of DVT (deep vein thrombosis)   . History of pulmonary embolism     currently on coumadin  . Anxiety   . New onset a-fib 2013    Past Surgical History  Procedure Laterality Date  . Suprapubic catheter insertion    . Kidney stone surgery      Social History   Social History  . Marital Status: Single    Spouse Name: N/A  . Number of Children: N/A  . Years of Education: N/A   Occupational History  . Not on file.   Social History Main Topics  . Smoking  status: Never Smoker   . Smokeless tobacco: Not on file  . Alcohol Use: No  . Drug Use: No  . Sexual Activity: Not on file   Other Topics Concern  . Not on file   Social History Narrative    Family History  Problem Relation Age of Onset  . Hypertension Father   . Hyperlipidemia Father   . Hypertension Mother    Past, family and social history reviewed.   Anti-infectives: Anti-infectives    Start     Dose/Rate Route Frequency Ordered Stop   05/15/15 1400  meropenem (MERREM) 1 g in sodium chloride 0.9 % 100 mL IVPB     1 g 200 mL/hr over 30 Minutes Intravenous 3 times per day 05/15/15 1007     05/14/15 1745  meropenem (MERREM) 500 mg in sodium chloride 0.9 % 50 mL IVPB  Status:  Discontinued     500 mg 100 mL/hr over 30 Minutes Intravenous 3 times per day 05/14/15 1742 05/15/15 1007   05/14/15 1730  levofloxacin (LEVAQUIN) IVPB 500 mg     500 mg 100 mL/hr over 60 Minutes Intravenous Every 24 hours 05/14/15 1723     05/14/15 1445  azithromycin (ZITHROMAX)  500 mg in dextrose 5 % 250 mL IVPB  Status:  Discontinued     500 mg 250 mL/hr over 60 Minutes Intravenous  Once 05/14/15 1439 05/14/15 1734   05/14/15 1445  cefTAZidime (FORTAZ) 1 g in dextrose 5 % 50 mL IVPB     1 g 100 mL/hr over 30 Minutes Intravenous  Once 05/14/15 1439 05/14/15 1611      Current Facility-Administered Medications  Medication Dose Route Frequency Provider Last Rate Last Dose  . 0.9 %  sodium chloride infusion   Intravenous Continuous Baxter Hire, MD 75 mL/hr at 05/16/15 0204    . acetaminophen (TYLENOL) suppository 650 mg  650 mg Rectal Q6H PRN Loletha Grayer, MD   650 mg at 05/15/15 1403   Or  . acetaminophen (TYLENOL) tablet 650 mg  650 mg Oral Q6H PRN Loletha Grayer, MD      . ALPRAZolam Duanne Moron) tablet 0.25 mg  0.25 mg Oral TID PRN Loletha Grayer, MD      . benzonatate (TESSALON) capsule 100 mg  100 mg Oral Q8H PRN Baxter Hire, MD      . budesonide (PULMICORT) nebulizer solution 0.25  mg  0.25 mg Nebulization BID Loletha Grayer, MD   0.25 mg at 05/16/15 0718  . citalopram (CELEXA) tablet 20 mg  20 mg Oral BID Baxter Hire, MD   20 mg at 05/15/15 0834  . clindamycin (CLEOCIN T) 1 % external solution 1 application  1 application Topical BID Baxter Hire, MD   1 application at 57/84/69 [phone]  . cyanocobalamin ((VITAMIN B-12)) injection 500 mcg  500 mcg Intramuscular Q30 days Baxter Hire, MD   500 mcg at 05/14/15 1903  . dextromethorphan (DELSYM) 30 MG/5ML liquid 90 mg  90 mg Oral BID PRN Baxter Hire, MD   90 mg at 05/14/15 2358  . flecainide (TAMBOCOR) tablet 50 mg  50 mg Oral BID Baxter Hire, MD   50 mg at 05/15/15 0834  . fluticasone (FLONASE) 50 MCG/ACT nasal spray 1 spray  1 spray Each Nare BID Baxter Hire, MD   1 spray at 05/16/15 [phone]  . gluconic acid-citric acid (RENACIDIN) irrigation 30 mL  30 mL Irrigation TID Loletha Grayer, MD   30 mL at 05/16/15 1000  . guaiFENesin (MUCINEX) 12 hr tablet 600 mg  600 mg Oral BID Baxter Hire, MD   600 mg at 05/15/15 3244  . HYDROcodone-acetaminophen (NORCO/VICODIN) 5-325 MG per tablet 1-2 tablet  1-2 tablet Oral Q6H PRN Baxter Hire, MD      . ipratropium-albuterol (DUONEB) 0.5-2.5 (3) MG/3ML nebulizer solution 3 mL  3 mL Nebulization Q6H Loletha Grayer, MD   3 mL at 05/16/15 0718  . ketoconazole (NIZORAL) 2 % shampoo 1 application  1 application Topical Q Fri Baxter Hire, MD   1 application at 09/12/70 [phone]  . levofloxacin (LEVAQUIN) IVPB 500 mg  500 mg Intravenous Q24H Baxter Hire, MD   500 mg at 05/15/15 1800  . loratadine (CLARITIN) tablet 10 mg  10 mg Oral QHS Baxter Hire, MD   10 mg at 05/14/15 2055  . LORazepam (ATIVAN) injection 0.5 mg  0.5 mg Intravenous BID Lance Coon, MD   0.5 mg at 05/15/15 2157  . meropenem (MERREM) 1 g in sodium chloride 0.9 % 100 mL IVPB  1 g Intravenous 3 times per day Loletha Grayer, MD   1 g at 05/16/15 0505  . metoprolol tartrate (LOPRESSOR) tablet 12.5  mg   12.5 mg Oral Q1200 Baxter Hire, MD   12.5 mg at 05/15/15 1300  . oxybutynin (DITROPAN) tablet 5 mg  5 mg Oral TID Baxter Hire, MD   5 mg at 05/15/15 5456  . pantoprazole (PROTONIX) EC tablet 40 mg  40 mg Oral Daily Baxter Hire, MD   40 mg at 05/15/15 2563  . polyethylene glycol (MIRALAX / GLYCOLAX) packet 17 g  17 g Oral Daily Baxter Hire, MD   17 g at 05/14/15 1900  . pravastatin (PRAVACHOL) tablet 10 mg  10 mg Oral q1800 Baxter Hire, MD   10 mg at 05/14/15 1859  . QUEtiapine (SEROQUEL) tablet 50 mg  50 mg Oral QHS Baxter Hire, MD   50 mg at 05/14/15 2054  . UROGESIC-BLUE 81.6 MG TABS 81.6 mg  1 tablet Oral BID Baxter Hire, MD   81.6 mg at 05/14/15 2217  . warfarin (COUMADIN) tablet 2.5 mg  2.5 mg Oral QODAY Vaughan Basta, MD   2.5 mg at 05/14/15 2300   And  . warfarin (COUMADIN) tablet 5 mg  5 mg Oral QODAY Vaughan Basta, MD   5 mg at 05/15/15 1800  . Warfarin - Physician Dosing Inpatient   Does not apply q1800 Vaughan Basta, MD         Objective: Vital signs in last 24 hours: Temp:  [98.7 F (37.1 C)-100.2 F (37.9 C)] 100 F (37.8 C) (09/04 0849) Pulse Rate:  [100-106] 105 (09/04 0849) Resp:  [16-20] 20 (09/04 0500) BP: (118-156)/(67-78) 156/78 mmHg (09/04 0849) SpO2:  [89 %-98 %] 92 % (09/04 0849)  Intake/Output from previous day: 09/03 0701 - 09/04 0700 In: 0  Out: 800 [Urine:800] Intake/Output this shift: Total I/O In: -  Out: 1850 [Urine:1850]   Physical Exam  Constitutional:  Well developed obese WM lying in bed.  Abdominal:  Clean SP tube site noted  Neurological:  A/O x 0. Paraplegic  Vitals reviewed.   Lab Results:   Recent Labs  05/14/15 1209 05/15/15 0454  WBC 16.7* 16.8*  HGB 11.8* 11.9*  HCT 36.4* 36.7*  PLT 183 185   BMET  Recent Labs  05/14/15 1209  NA 136  K 3.7  CL 102  CO2 27  GLUCOSE 99  BUN 12  CREATININE 0.48*  CALCIUM 9.2   PT/INR  Recent Labs  05/15/15 0454  05/16/15 0536  LABPROT 27.1* 20.9*  INR 2.50 1.78   ABG No results for input(s): PHART, HCO3 in the last 72 hours.  Invalid input(s): PCO2, PO2  Studies/Results: Ct Abdomen Pelvis W Contrast  05/14/2015   CLINICAL DATA:  Right-sided abdominal pain  EXAM: CT ABDOMEN AND PELVIS WITH CONTRAST  TECHNIQUE: Multidetector CT imaging of the abdomen and pelvis was performed using the standard protocol following bolus administration of intravenous contrast.  CONTRAST:  133mL OMNIPAQUE IOHEXOL 300 MG/ML  SOLN  COMPARISON:  12/08/2014  FINDINGS: Lower chest: Patchy bilateral lower lobe nodular and dependent airspace consolidation is identified, right greater than left. Imaging degraded by motion and noise from body habitus. Trace pleural effusions are present.  Hepatobiliary: Unenhanced liver and gallbladder are grossly unremarkable.  Pancreas: Fatty infiltrated but unremarkable.  Spleen: Normal with the exception of a stable 1.6 cm cyst or lymphangioma/hemangioma incidentally noted, unchanged.  Adrenals/Urinary Tract: Lobular renal contour with areas of presumed cortical thinning and scarring. No hydroureteronephrosis. No radiopaque ureteral, renal, or bladder calculus. A suprapubic Foley catheter is in place and the  bladder is decompressed.  Stomach/Bowel: No bowel wall thickening or focal segmental dilatation is identified. Appendix appears normal. Mild gaseous prominence of the mid colon without overt dilatation or wall thickening.  Vascular/Lymphatic: No aortic aneurysm.  No lymphadenopathy.  Other: No free air or fluid. Bilateral fat containing inguinal hernias are identified.  Musculoskeletal: Mild right hip degenerative change.  IMPRESSION: No acute intra-abdominal or pelvic pathology.  Patchy right greater than left nodular and dependent airspace consolidation suspicious for pneumonia. This could account for the history of right-sided abdominal pain if referred.   Electronically Signed   By: Conchita Paris  M.D.   On: 05/14/2015 14:16   Dg Chest Port 1 View  05/14/2015   CLINICAL DATA:  Right-sided pain.  Recent urinary tract infection  EXAM: PORTABLE CHEST - 1 VIEW  COMPARISON:  March 22, 2015  FINDINGS: There is scarring in the right base region. There is no edema or consolidation. Heart is upper normal in size with pulmonary vascularity within normal limits. No adenopathy. No bone lesions.  IMPRESSION: Scarring right base region. No edema or consolidation. No change in cardiac silhouette.   Electronically Signed   By: Lowella Grip III M.D.   On: 05/14/2015 13:10   Case discussed with Dr. Leslye Peer.   Procedure:  SP tube changed without complications using aseptic technique with a fresh 44QK silicone foley with 86NO balloon and new drainage bag.    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  Tube changed.  Will need f/u with Dr. Jacqlyn Larsen in 1 month for tube change.   CC: Dr. Loletha Grayer and Dr. Edrick Oh.      Libni Fusaro J 05/16/2015 [phone]

## 2015-05-16 NOTE — Plan of Care (Signed)
Problem: Discharge Progression Outcomes Goal: Barriers To Progression Addressed/Resolved Individualization: Pt from home lives with parents, pts current mobility/mental issues occurred when pt was in the 9th grade, pt completely normal child until 9th grade and mobility and mental issues have gradually declined since, unknown diagnosis of pts chronic issue, history of HTN, frontotemporal dementia, neutrogentic bladder suprapubic cath, PE, DVT and afib controlled by home meds, cpap at night, not on chronic o2, puree diet with nectar thick liquids, uses a lift at home, home care sitters from [facility] help out family and pt as needed Outcome: Progressing Individualization of Care Pt prefers to be called Kent [name] (per family)  Plan of care progress to goal:     Goal: Other Discharge Outcomes/Goals 1.  Pain:  Patient without out signs of pain using the PAINAD scale this shift.  He did exhibit signs of anxiety and was given Ativan IVP due to NPO with effective results. 2.  Hemodynamically:  Patient afebrile and VSS this shift. BP 130/77 mmHg  Pulse 100  Temp(Src) 98.7 F (37.1 C) (Oral)  Resp 16  Ht 5\' 11"  (1.803 m)  Wt 291 lb 0.1 oz (132 kg)  BMI 40.61 kg/m2  SpO2 98% 3.  Complications:  Patient had episode of aspiration during day shift.  Patient NPO this shift and O2 stats have improved.  Patient continues with Rhonchi and crackles. 4.  Diet:  Patient NPO and tolerating well. 5.  Activity:  Patient on bedrest.  Turned every two hours and heels floated.  Tolerating well.

## 2015-05-16 NOTE — Progress Notes (Signed)
Orally suctioned to back of throat. Was unable to stimulate cough. Minimal secretions. Pt unable/unwilling to generate spontaneous cough.

## 2015-05-16 NOTE — Progress Notes (Signed)
Received three vials of 30 mL renacidin bladder irrigation solution as patient own med. There is no active order for this product on the patient's profile so the solution has been stored in the pharmacy.   Jaxston Chohan A. [place], [place].D. Clinical Pharmacist

## 2015-05-16 NOTE — Progress Notes (Addendum)
Patient ID: Kent [name], male   DOB: January 04, 1966, 49 y.o.   MRN: [id number] [facility] PROGRESS NOTE  PCP: Ashok Norris, MD  HPI/Subjective: I stimulated the patient and he opened his eyes. He is able to focus on me for a few seconds at a time period and then rolled his eyes back. He was having trouble swallowing last night and he was made nothing by mouth.  Objective: Filed Vitals:   05/16/15 0849  BP: 156/78  Pulse: 105  Temp: 100 F (37.8 C)  Resp:     Filed Weights   05/14/15 1203  Weight: 132 kg (291 lb 0.1 oz)    ROS: Review of Systems  Unable to perform ROS Secondary to mental status Exam: Physical Exam  HENT:  Nose: No mucosal edema.  Mouth/Throat: No oropharyngeal exudate or posterior oropharyngeal edema.  Eyes: Conjunctivae, EOM and lids are normal. Pupils are equal, round, and reactive to light.  Neck: No JVD present. Carotid bruit is not present. No edema present. No thyroid mass and no thyromegaly present.  Cardiovascular: S1 normal and S2 normal.  Exam reveals no gallop.   No murmur heard. Pulses:      Dorsalis pedis pulses are 2+ on the right side, and 2+ on the left side.  Respiratory: No respiratory distress. He has decreased breath sounds in the right lower field and the left lower field. He has wheezes in the right middle field, the right lower field, the left middle field and the left lower field. He has rhonchi in the right middle field, the right lower field, the left middle field and the left lower field. He has no rales.  GI: Soft. Bowel sounds are normal. He exhibits distension. There is no tenderness.  Musculoskeletal:       Right ankle: He exhibits swelling.       Left ankle: He exhibits swelling.  Lymphadenopathy:    He has no cervical adenopathy.  Neurological: He is alert.  Skin: Skin is warm. No rash noted. Nails show no clubbing.  Psychiatric: His mood appears anxious.    Data Reviewed: Basic Metabolic  Panel:  Recent Labs Lab 05/14/15 1209  NA 136  K 3.7  CL 102  CO2 27  GLUCOSE 99  BUN 12  CREATININE 0.48*  CALCIUM 9.2   Liver Function Tests:  Recent Labs Lab 05/14/15 1209  AST 32  ALT 38  ALKPHOS 84  BILITOT 1.0  PROT 7.1  ALBUMIN 3.5    Recent Labs Lab 05/14/15 1209  LIPASE 20*   CBC:  Recent Labs Lab 05/14/15 1209 05/15/15 0454  WBC 16.7* 16.8*  NEUTROABS 14.3*  --   HGB 11.8* 11.9*  HCT 36.4* 36.7*  MCV 87.2 86.8  PLT 183 185     Recent Results (from the past 240 hour(s))  Urine culture     Status: None (Preliminary result)   Collection Time: 05/14/15 12:39 PM  Result Value Ref Range Status   Specimen Description URINE, CLEAN CATCH  Final   Special Requests Normal  Final   Culture   Final    >=100,000 COLONIES/mL GRAM NEGATIVE RODS IDENTIFICATION AND SUSCEPTIBILITIES TO FOLLOW    Report Status PENDING  Incomplete  Culture, blood (routine x 2)     Status: None (Preliminary result)   Collection Time: 05/14/15  3:21 PM  Result Value Ref Range Status   Specimen Description BLOOD LEFT FOREARM  Final   Special Requests   Final  BOTTLES DRAWN AEROBIC AND ANAEROBIC  AER 2CC ANA 4CC   Culture NO GROWTH 2 DAYS  Final   Report Status PENDING  Incomplete  Culture, blood (routine x 2)     Status: None (Preliminary result)   Collection Time: 05/14/15  3:21 PM  Result Value Ref Range Status   Specimen Description BLOOD RIGHT ASSIST CONTROL  Final   Special Requests   Final    BOTTLES DRAWN AEROBIC AND ANAEROBIC  AER 12 CC ANA [place]   Culture NO GROWTH 2 DAYS  Final   Report Status PENDING  Incomplete     Studies: Ct Abdomen Pelvis W Contrast  05/14/2015   CLINICAL DATA:  Right-sided abdominal pain  EXAM: CT ABDOMEN AND PELVIS WITH CONTRAST  TECHNIQUE: Multidetector CT imaging of the abdomen and pelvis was performed using the standard protocol following bolus administration of intravenous contrast.  CONTRAST:  151mL OMNIPAQUE IOHEXOL 300 MG/ML  SOLN   COMPARISON:  12/08/2014  FINDINGS: Lower chest: Patchy bilateral lower lobe nodular and dependent airspace consolidation is identified, right greater than left. Imaging degraded by motion and noise from body habitus. Trace pleural effusions are present.  Hepatobiliary: Unenhanced liver and gallbladder are grossly unremarkable.  Pancreas: Fatty infiltrated but unremarkable.  Spleen: Normal with the exception of a stable 1.6 cm cyst or lymphangioma/hemangioma incidentally noted, unchanged.  Adrenals/Urinary Tract: Lobular renal contour with areas of presumed cortical thinning and scarring. No hydroureteronephrosis. No radiopaque ureteral, renal, or bladder calculus. A suprapubic Foley catheter is in place and the bladder is decompressed.  Stomach/Bowel: No bowel wall thickening or focal segmental dilatation is identified. Appendix appears normal. Mild gaseous prominence of the mid colon without overt dilatation or wall thickening.  Vascular/Lymphatic: No aortic aneurysm.  No lymphadenopathy.  Other: No free air or fluid. Bilateral fat containing inguinal hernias are identified.  Musculoskeletal: Mild right hip degenerative change.  IMPRESSION: No acute intra-abdominal or pelvic pathology.  Patchy right greater than left nodular and dependent airspace consolidation suspicious for pneumonia. This could account for the history of right-sided abdominal pain if referred.   Electronically Signed   By: Conchita Paris M.D.   On: 05/14/2015 14:16   Dg Chest Port 1 View  05/14/2015   CLINICAL DATA:  Right-sided pain.  Recent urinary tract infection  EXAM: PORTABLE CHEST - 1 VIEW  COMPARISON:  March 22, 2015  FINDINGS: There is scarring in the right base region. There is no edema or consolidation. Heart is upper normal in size with pulmonary vascularity within normal limits. No adenopathy. No bone lesions.  IMPRESSION: Scarring right base region. No edema or consolidation. No change in cardiac silhouette.   Electronically  Signed   By: Lowella Grip [name] M.D.   On: 05/14/2015 13:10    Scheduled Meds: . budesonide (PULMICORT) nebulizer solution  0.25 mg Nebulization BID  . citalopram  20 mg Oral BID  . clindamycin  1 application Topical BID  . cyanocobalamin  500 mcg Intramuscular Q30 days  . flecainide  50 mg Oral BID  . fluticasone  1 spray Each Nare BID  . gluconic acid-citric acid  30 mL Irrigation TID  . guaiFENesin  600 mg Oral BID  . ipratropium-albuterol  3 mL Nebulization Q6H  . ketoconazole  1 application Topical Q Fri  . levofloxacin (LEVAQUIN) IV  500 mg Intravenous Q24H  . loratadine  10 mg Oral QHS  . LORazepam  0.5 mg Intravenous BID  . meropenem (MERREM) IV  1 g Intravenous  3 times per day  . methylPREDNISolone (SOLU-MEDROL) injection  60 mg Intravenous [id number]  . metoprolol tartrate  12.5 mg Oral Q1200  . oxybutynin  5 mg Oral TID  . pantoprazole  40 mg Oral Daily  . polyethylene glycol  17 g Oral Daily  . pravastatin  10 mg Oral q1800  . QUEtiapine  50 mg Oral QHS  . UROGESIC-BLUE  1 tablet Oral BID  . warfarin  2.5 mg Oral QODAY   And  . warfarin  5 mg Oral QODAY  . Warfarin - Physician Dosing Inpatient   Does not apply q1800   Continuous Infusions: . sodium chloride 75 mL/hr at 05/16/15 0204    Assessment/Plan:  1. Acute respiratory failure with hypoxia now on 4 L nasal cannula  2. Pneumonia bilaterally worse on the right than the left. Leukocytosis. Now with fever and tachycardia. Meets criteria for clinical sepsis. Patient on aggressive antibiotics with meropenem and Levaquin. Likely aspiration. Added IV Solu-Medrol and continue budesonide nebulizers and DuoNeb nebulizer. 3. Chronic urinary tract infection with suprapubic catheter. Gram-negative rods growing out of culture continue antibiotics for right now. Urology changed his suprapubic catheter 4. History of pulmonary embolism- INR drifting down now that he is nothing by mouth. May need to start heparin drip  tomorrow. 5. History of frontal temporal dementia 6. Essential hypertension- continue usual medications 7. Sleep apnea obesity  Code Status:     Code Status Orders        Start     Ordered   05/14/15 1724  Full code   Continuous     05/14/15 1723    Advance Directive Documentation        Most Recent Value   Type of Advance Directive  Healthcare Power of Attorney, Living will   Pre-existing out of facility DNR order (yellow form or pink MOST form)     "MOST" Form in Place?       Family Communication: Family at bedside Disposition Plan: Home if he improves.  Time spent: 25 minutes  Loletha Grayer  [facility]

## 2015-05-17 LAB — BASIC METABOLIC PANEL WITH GFR
Anion gap: 9 (ref 5–15)
BUN: 16 mg/dL (ref 6–20)
CO2: 24 mmol/L (ref 22–32)
Calcium: 8.6 mg/dL — ABNORMAL LOW (ref 8.9–10.3)
Chloride: 108 mmol/L (ref 101–111)
Creatinine, Ser: 0.46 mg/dL — ABNORMAL LOW (ref 0.61–1.24)
GFR calc Af Amer: >60 mL/min
GFR calc non Af Amer: >60 mL/min
Glucose, Bld: 148 mg/dL — ABNORMAL HIGH (ref 65–99)
Potassium: 3.4 mmol/L — ABNORMAL LOW (ref 3.5–5.1)
Sodium: 141 mmol/L (ref 135–145)

## 2015-05-17 LAB — PROTIME-INR
INR: 1.6
PROTHROMBIN TIME: 19.2 s — AB (ref 11.4–15.0)

## 2015-05-17 LAB — CBC
HCT: 33.6 % — ABNORMAL LOW (ref 40.0–52.0)
Hemoglobin: 11 g/dL — ABNORMAL LOW (ref 13.0–18.0)
MCH: 28.2 pg (ref 26.0–34.0)
MCHC: 32.6 g/dL (ref 32.0–36.0)
MCV: 86.4 fL (ref 80.0–100.0)
Platelets: 252 10*3/uL (ref 150–440)
RBC: 3.89 MIL/uL — ABNORMAL LOW (ref 4.40–5.90)
RDW: 20.4 % — ABNORMAL HIGH (ref 11.5–14.5)
WBC: 5.7 10*3/uL (ref 3.8–10.6)

## 2015-05-17 MED ORDER — URELLE 81 MG PO TABS
1.0000 | ORAL_TABLET | Freq: Two times a day (BID) | ORAL | Status: DC
  Administered 2015-05-18 – 2015-05-19 (×3): 81 mg via ORAL
  Filled 2015-05-17 (×6): qty 1

## 2015-05-17 NOTE — Plan of Care (Addendum)
Problem: Discharge Progression Outcomes Goal: Barriers To Progression Addressed/Resolved Individualization: Pt from home lives with parents, pts current mobility/mental issues occurred when pt was in the 9th grade, pt completely normal child until 9th grade and mobility and mental issues have gradually declined since, unknown diagnosis of pts chronic issue, history of HTN, frontotemporal dementia, neutrogentic bladder suprapubic cath, PE, DVT and afib controlled by home meds, cpap at night, not on chronic o2, puree diet with nectar thick liquids, uses a lift at home, home care sitters from [facility] program help out family and pt as needed Outcome: Not Progressing . Cooperative.ivfs infusing Goal: Pain controlled with appropriate interventions Outcome: Progressing No c/o  pain Goal: Hemodynamically stable Outcome: Progressing Iv abx cont Goal sp catheter  flushed Outcome: Progressing  Goal: Activity appropriate for discharge plan Outcome: Progressing

## 2015-05-17 NOTE — Care Management Important Message (Signed)
Important Message  Patient Details  Name: Kent [name] MRN: [id number] Date of Birth: 1966-01-18   Medicare Important Message Given:  Yes-second notification given    Shelbie Ammons, RN 05/17/2015, 8:58 AM

## 2015-05-17 NOTE — Progress Notes (Signed)
Patient ID: Kent [name], male   DOB: 04/24/1966, 49 y.o.   MRN: [id number] [facility] PROGRESS NOTE  PCP: Ashok Norris, MD  HPI/Subjective: Patient is awake and alert and nonverbal . Father bedside is reporting that patient is  aspirating if he eats food Objective: Filed Vitals:   05/17/15 1246  BP: 130/85  Pulse: 90  Temp: 98.7 F (37.1 C)  Resp: 18    Filed Weights   05/14/15 1203  Weight: 132 kg (291 lb 0.1 oz)    ROS: Review of Systems  Unable to perform ROS Secondary to mental status Exam: Physical Exam  HENT:  Nose: No mucosal edema.  Mouth/Throat: No oropharyngeal exudate or posterior oropharyngeal edema.  Eyes: Conjunctivae, EOM and lids are normal. Pupils are equal, round, and reactive to light.  Neck: No JVD present. Carotid bruit is not present. No edema present. No thyroid mass and no thyromegaly present.  Cardiovascular: S1 normal and S2 normal.  Exam reveals no gallop.   No murmur heard. Pulses:      Dorsalis pedis pulses are 2+ on the right side, and 2+ on the left side.  Respiratory: No respiratory distress. He has decreased breath sounds in the right lower field and the left lower field. He has wheezes in the right middle field, the right lower field, the left middle field and the left lower field. He has rhonchi in the right middle field, the right lower field, the left middle field and the left lower field. He has no rales.  GI: Soft. Bowel sounds are normal. He exhibits distension. There is no tenderness.  Musculoskeletal:       Right ankle: He exhibits swelling.       Left ankle: He exhibits swelling.  Lymphadenopathy:    He has no cervical adenopathy.  Neurological: He is alert.  Skin: Skin is warm. No rash noted. Nails show no clubbing.  Psychiatric: His mood appears anxious.    Data Reviewed: Basic Metabolic Panel:  Recent Labs Lab 05/14/15 1209 05/17/15 0514  NA 136 141  K 3.7 3.4*  CL 102 108  CO2 27 24   GLUCOSE 99 148*  BUN 12 16  CREATININE 0.48* 0.46*  CALCIUM 9.2 8.6*   Liver Function Tests:  Recent Labs Lab 05/14/15 1209  AST 32  ALT 38  ALKPHOS 84  BILITOT 1.0  PROT 7.1  ALBUMIN 3.5    Recent Labs Lab 05/14/15 1209  LIPASE 20*   CBC:  Recent Labs Lab 05/14/15 1209 05/15/15 0454 05/17/15 0514  WBC 16.7* 16.8* 5.7  NEUTROABS 14.3*  --   --   HGB 11.8* 11.9* 11.0*  HCT 36.4* 36.7* 33.6*  MCV 87.2 86.8 86.4  PLT 183 185 252     Recent Results (from the past 240 hour(s))  Urine culture     Status: None   Collection Time: 05/14/15 12:39 PM  Result Value Ref Range Status   Specimen Description URINE, CLEAN CATCH  Final   Special Requests Normal  Final   Culture >=100,000 COLONIES/mL PSEUDOMONAS AERUGINOSA  Final   Report Status 05/16/2015 FINAL  Final   Organism ID, Bacteria PSEUDOMONAS AERUGINOSA  Final      Susceptibility   Pseudomonas aeruginosa - MIC*    CEFTAZIDIME 2 SENSITIVE Sensitive     CIPROFLOXACIN <=0.25 SENSITIVE Sensitive     GENTAMICIN 4 SENSITIVE Sensitive     IMIPENEM 1 SENSITIVE Sensitive     PIP/TAZO Value in next row Sensitive  SENSITIVE<=4    * >=100,000 COLONIES/mL PSEUDOMONAS AERUGINOSA  Culture, blood (routine x 2)     Status: None (Preliminary result)   Collection Time: 05/14/15  3:21 PM  Result Value Ref Range Status   Specimen Description BLOOD LEFT FOREARM  Final   Special Requests   Final    BOTTLES DRAWN AEROBIC AND ANAEROBIC  AER 2CC ANA 4CC   Culture NO GROWTH 3 DAYS  Final   Report Status PENDING  Incomplete  Culture, blood (routine x 2)     Status: None (Preliminary result)   Collection Time: 05/14/15  3:21 PM  Result Value Ref Range Status   Specimen Description BLOOD RIGHT ASSIST CONTROL  Final   Special Requests   Final    BOTTLES DRAWN AEROBIC AND ANAEROBIC  AER 12 CC ANA [place]   Culture NO GROWTH 3 DAYS  Final   Report Status PENDING  Incomplete     Studies: No results found.  Scheduled Meds: .  budesonide (PULMICORT) nebulizer solution  0.25 mg Nebulization BID  . citalopram  20 mg Oral BID  . clindamycin  1 application Topical BID  . cyanocobalamin  500 mcg Intramuscular Q30 days  . flecainide  50 mg Oral BID  . fluticasone  1 spray Each Nare BID  . gluconic acid-citric acid  30 mL Irrigation TID  . guaiFENesin  600 mg Oral BID  . ipratropium-albuterol  3 mL Nebulization Q6H  . ketoconazole  1 application Topical Q Fri  . levofloxacin (LEVAQUIN) IV  500 mg Intravenous Q24H  . loratadine  10 mg Oral QHS  . LORazepam  0.5 mg Intravenous BID  . meropenem (MERREM) IV  1 g Intravenous 3 times per day  . methylPREDNISolone (SOLU-MEDROL) injection  60 mg Intravenous [id number]  . metoprolol tartrate  12.5 mg Oral Q1200  . oxybutynin  5 mg Oral TID  . pantoprazole  40 mg Oral Daily  . polyethylene glycol  17 g Oral Daily  . pravastatin  10 mg Oral q1800  . QUEtiapine  50 mg Oral QHS  . URELLE  1 tablet Oral BID  . warfarin  2.5 mg Oral QODAY   And  . warfarin  5 mg Oral QODAY  . Warfarin - Physician Dosing Inpatient   Does not apply q1800   Continuous Infusions: . sodium chloride 75 mL/hr at 05/17/15 1317    Assessment/Plan:  1. Acute respiratory failure with hypoxia now on 4 L nasal cannula  2. Pneumonia bilaterally worse on the right than the left. Leukocytosis,  fever and tachycardia. Most likely secondary to aspiration Meets criteria for clinical sepsis. Patient on aggressive antibiotics with meropenem and Levaquin. On IV Solu-Medrol for bronchoconstriction and continue budesonide nebulizers and DuoNeb nebulizer. Blood cultures are negative to date 3. Chronic urinary tract infection with suprapubic catheter.urine culture with Pseudomonas, pansensitive continue antibiotics . Urology changed his suprapubic catheter 4. History of pulmonary embolism- INR drifting down now that he is nothing by mouth. May need to start heparin drip tomorrow. 5. History of frontal temporal  dementia 6. Essential hypertension- continue usual medications 7. Sleep apnea obesity  Code Status:     Code Status Orders        Start     Ordered   05/14/15 1724  Full code   Continuous     05/14/15 1723    Advance Directive Documentation        Most Recent Value   Type of Advance Directive  Healthcare Power of  Attorney, Living will   Pre-existing out of facility DNR order (yellow form or pink MOST form)     "MOST" Form in Place?       Family Communication: Family at bedside, plan of care discussed in detail with the patient's dad at bedside Disposition Plan: Home if he improves.  Time spent: 35 minutes  Diana Davenport  [facility] [place] [facility]

## 2015-05-17 NOTE — Progress Notes (Signed)
   05/17/15 0936  Clinical Encounter Type  Visited With Patient and family together  Visit Type Initial  Provided pastoral support and presence.  Family member said patient is non-verbal and "not doing too well today, but thanks for stopping by."  Family member said he appreciated my visit.  [place] [phone]

## 2015-05-17 NOTE — Care Management (Addendum)
Admitted to [facility] with the diagnosis of pneumonia. Lives with parents Izzy and Manish Ruggiero [phone]). Followed by Dr. Rutherford Nail for primary care. Last seen in Dr. Walker Kehr office July 14th.  CAP services 58hours/week.  Followed by [place]. Uses a rolling walker to aid in ambulation, but mostly bedbound. Suprapubic cath. Uses transport per rescue unit. Father at the bedside. NPO. Aspiration precautions. Shelbie Ammons RN MSN Care Management [phone]

## 2015-05-17 NOTE — Plan of Care (Signed)
Problem: Discharge Progression Outcomes Goal: Other Discharge Outcomes/Goals Plan of care progress to goal: O2: 3L [place] Pain: no signs of discomfort Hemodynamically: VSS Diet: NPO due to aspiration Activity: total care

## 2015-05-18 LAB — BASIC METABOLIC PANEL
Anion gap: 7 (ref 5–15)
BUN: 25 mg/dL — AB (ref 6–20)
CHLORIDE: 112 mmol/L — AB (ref 101–111)
CO2: 26 mmol/L (ref 22–32)
Calcium: 8.6 mg/dL — ABNORMAL LOW (ref 8.9–10.3)
Creatinine, Ser: 0.4 mg/dL — ABNORMAL LOW (ref 0.61–1.24)
GFR calc Af Amer: >60 mL/min (ref >60)
GFR calc non Af Amer: >60 mL/min (ref >60)
GLUCOSE: 152 mg/dL — AB (ref 65–99)
POTASSIUM: 3.4 mmol/L — AB (ref 3.5–5.1)
Sodium: 145 mmol/L (ref 135–145)

## 2015-05-18 LAB — PROTIME-INR
INR: 1.77
Prothrombin Time: 20.8 seconds — ABNORMAL HIGH (ref 11.4–15.0)

## 2015-05-18 NOTE — Progress Notes (Signed)
[place] [facility] [place]    MR#:  [id number]  DATE OF BIRTH:  Jan 24, 1966  SUBJECTIVE:  Pt non verbal, non communicable. Parents in the room  REVIEW OF SYSTEMS:   Review of Systems  Unable to perform ROS: mental acuity   Tolerating Diet:npo Tolerating PT: no bedbound  DRUG ALLERGIES:   Allergies  Allergen Reactions  . Codeine Nausea Only  . Piperacillin-Tazobactam In Dex Rash and Other (See Comments)    Arm erythema on administration, but tolerated cephalosporins fine.    . Vancomycin Rash and Other (See Comments)    Reaction:  Red man syndrome   . Zosyn [Piperacillin Sod-Tazobactam So] Rash and Other (See Comments)    Pt experienced arm erythema on administration, but tolerated cephalosporins fine.      VITALS:  Blood pressure 139/73, pulse 85, temperature 98.2 F (36.8 C), temperature source Oral, resp. rate 18, height 5\' 11"  (1.803 m), weight 132 kg (291 lb 0.1 oz), SpO2 94 %.  PHYSICAL EXAMINATION:   Physical Exam  GENERAL:  49 y.o.-year-old patient lying in the bed with no acute distress. obese EYES: Pupils equal, round, reactive to light and accommodation. No scleral icterus. Extraocular muscles intact.  HEENT: Head atraumatic, normocephalic. Oropharynx and nasopharynx clear. Mucosa dry NECK:  Supple, no jugular venous distention. No thyroid enlargement, no tenderness.  LUNGS: coarse breath sounds bilaterally, no wheezing, rales, rhonchi. No use of accessory muscles of respiration.  CARDIOVASCULAR: S1, S2 normal. No murmurs, rubs, or gallops.  ABDOMEN: Soft, nontender, nondistended. Bowel sounds present. No organomegaly or mass. obese EXTREMITIES: No cyanosis, clubbing , ++ edema b/l.    NEUROLOGIC: unable to assess -Psychiatry: unable to assess due to severe dementia SKIN: No obvious rash, lesion, or ulcer.   LABORATORY PANEL:   CBC  Recent Labs Lab 05/17/15 0514  WBC 5.7  HGB 11.0*  HCT  33.6*  PLT 252    Chemistries   Recent Labs Lab 05/14/15 1209  05/18/15 0439  NA 136  < > 145  K 3.7  < > 3.4*  CL 102  < > 112*  CO2 27  < > 26  GLUCOSE 99  < > 152*  BUN 12  < > 25*  CREATININE 0.48*  < > 0.40*  CALCIUM 9.2  < > 8.6*  AST 32  --   --   ALT 38  --   --   ALKPHOS 84  --   --   BILITOT 1.0  --   --   < > = values in this interval not displayed.  Cardiac Enzymes  Recent Labs Lab 05/14/15 1209  TROPONINI <0.03    RADIOLOGY:  No results found.   ASSESSMENT AND PLAN:   1. Acute respiratory failure with hypoxia now on 4 L nasal cannula  2. Pneumonia bilaterally worse on the right than the left. Leukocytosis, fever and tachycardia. Most likely secondary to aspiration Meets criteria for clinical sepsis. Patient on aggressive antibiotics with meropenem and Levaquin. D/cIV Solu-Medrol for bronchoconstriction sicne pt imprvovoing and continue budesonide nebulizers and DuoNeb nebulizer. Blood cultures are negative to date 3. Chronic urinary tract infection with suprapubic catheter.urine culture with Pseudomonas, pansensitive continue antibiotics . Urology changed his suprapubic catheter 4. History of pulmonary embolism- INR drifting down now that he is nothing by mouth. History of frontal temporal dementia 5. Essential hypertension- continue usual medications 6. Sleep apnea obesity 7. Nutrition spoke with speech therapist and  pt from previous evals is a HIGH risk for aspiration. Spoke with mother and father about option for PEG feeding knowing it does not completely take with risk down. They are not eager with it and want to see if re-feeding helps hoim. They do understand doing this can worsen his PNEumonia.   Case discussed with Care Management/Social Worker. Management plans discussed with the patient, family and they are in agreement.  CODE STATUS: Full  DVT Prophylaxis: warfarin  TOTAL TIME TAKING CARE OF THIS PATIENT: 40 minutes.  >50% time spent on  counselling and coordination of care  POSSIBLE D/C IN 1-2 DAYS, DEPENDING ON CLINICAL CONDITION.   Dagoberto Nealy M.D on 05/18/2015 at 12:04 PM  Between 7am to 6pm - Pager - [phone]  After 6pm go to [email or web address] - password [email or web address] [place]  Office  [phone]  CC: Primary care physician; Ashok Norris, MD

## 2015-05-18 NOTE — Plan of Care (Signed)
Problem: Discharge Progression Outcomes Goal: Discharge plan in place and appropriate Outcome: Progressing Individualization Pt from home lives with parents, pts current mobility/mental issues occurred when pt was in the 9th grade, pt completely normal child until 9th grade and mobility and mental issues have gradually declined since, unknown diagnosis of pts chronic issue, history of HTN, frontotemporal dementia, neutrogentic bladder suprapubic cath, PE, DVT and afib controlled by home meds, cpap at night, not on chronic o2, puree diet with nectar thick liquids, uses a lift at home, home care sitters from [facility] program help out family and pt as needed Goal: Other Discharge Outcomes/Goals Outcome: Progressing Discharge plan: is for patient to go home with hospice.  Currently has NT care  In the home. O2: continues on 3l [place] with cpap in the evening.  Family has decided that to go ahead with oral diet even with possible aspiration. Pain: Pt does not appear to be in any pain. Hemodynamically stable. Diet:  Pt needs to be fed.  No obvious signa of aspiration during or after feeding. Activity: pt only move s head to side.

## 2015-05-19 LAB — CULTURE, BLOOD (ROUTINE X 2)
CULTURE: NO GROWTH
Culture: NO GROWTH

## 2015-05-19 LAB — PROTIME-INR
INR: 1.36
Prothrombin Time: 17 seconds — ABNORMAL HIGH (ref 11.4–15.0)

## 2015-05-19 MED ORDER — LEVOFLOXACIN 500 MG PO TABS: 500.0000 mg | ORAL_TABLET | Freq: Every day | ORAL | Status: DC

## 2015-05-19 MED ORDER — URELLE 81 MG PO TABS: 1.0000 | ORAL_TABLET | Freq: Two times a day (BID) | ORAL | Status: DC

## 2015-05-19 MED ORDER — LEVOFLOXACIN 500 MG PO TABS
500.0000 mg | ORAL_TABLET | Freq: Every day | ORAL | Status: DC
  Administered 2015-05-19: 500 mg via ORAL
  Filled 2015-05-19: qty 1

## 2015-05-19 MED ORDER — ALPRAZOLAM 0.25 MG PO TABS: 0.2500 mg | ORAL_TABLET | Freq: Three times a day (TID) | ORAL | Status: DC | PRN

## 2015-05-19 NOTE — Plan of Care (Signed)
Problem: Discharge Progression Outcomes Goal: Other Discharge Outcomes/Goals Outcome: Completed/Met Date Met:  05/19/15 Patient had no c/o pain this shift  VSS except patient had a low grade fever of 99.3 which came down to 98.7 after getting tylenol po Family at bedside Strict aspiration precautions in place meds crushed in puree Patient continues on oral ABX Patient taken of 02 at 2 liters and was on room air with sat of 92-93  Received MD order to discharge patient to home with home health, prescriptions and discharge instructions reviewed parents and mom verbalized understanding patient discharged with EMS in stretcher to home with home health

## 2015-05-19 NOTE — Progress Notes (Signed)
Paged Dr. Jannifer Franklin about code status.  Pts family said he was a DNR and that was his previous code status on a prior admission.  States this was the pts wish when he was able to talk to them.  Dr. Jannifer Franklin changed status to DNR.

## 2015-05-19 NOTE — Care Management Important Message (Signed)
Important Message  Patient Details  Name: Kent [name] MRN: [id number] Date of Birth: 04/09/1966   Medicare Important Message Given:  Yes-third notification given    Juliann Pulse A Allmond 05/19/2015, 10:59 AM

## 2015-05-19 NOTE — Progress Notes (Signed)
Patient is medically stable for D/C home today and requires EMS for transport. Clinical Education officer, museum (CSW) met with patient's father at bedside and confirmed address. EMS packet complete and on chart. RN aware of above. Please reconsult if future social work needs arise. CSW signing off.   Blima Rich, [place] [phone]

## 2015-05-19 NOTE — Progress Notes (Signed)
Speech Language Pathology Treatment: Dysphagia  Patient Details Name: Kent [name] MRN: [id number] DOB: April 29, 1966 Today's Date: 05/19/2015 Time: 1100-1130 SLP Time Calculation (min) (ACUTE ONLY): 30 min  Assessment / Plan / Recommendation Clinical Impression  Met w/ parents of pt for educational session on aspiration precautions; diet consistency, and food/liquid preparation. Model given on the consistency of liquids and demo. On presenting the liquids w/ time b/t sips/boluses for pt to swallow and clear any potential pharyngeal residue. Parents gave verbal understanding of the education provided. Handouts given on precautions and ordering information for pt's food and liquid consistency/diet rec'd. ST will f/u while pt is admitted for anything further.    HPI Other Pertinent Information: Pt is a 49 y.o. male with a known history of *under temporal dementia, neurogenic bladder with indwelling suprapubic catheter, recent multidrug-resistant UTI, chronic UTIs, HTN, Obesity, GERD on PPI, sleep apnea, anxiety and other medical issues. Pt is nonverbal but communicates w/ phonations; he requires total assist w/ ADLs. Pt has been seen by ST services in the past and dx'd w/ Dysphagia and placed on a Dys. 1 w/ Nectar liquids diet. Father stated they give him thickened liquids at home; requires feeding. Pt admitted w/ pain starting last night in the upper right abd area. CT scan shows right sided pneumonia. Noted pt presented w/ congested respirations at baseline prior to po's. Pt has been NPO d/t increased concerns of aspiration. MD has spoken w/ parents who have decided to continue w/ an oral diet at this time w/ aspiration precautions; parents stated they are "not ready" for a PEG at this time. Pt is on a Dys. 1 w/ Nectar liquids.    Pertinent Vitals Pain Assessment: No/denies pain  SLP Plan  Continue with current plan of care (while admitted; at discharge TBD)    Recommendations Diet  recommendations: Dysphagia 1 (puree);Nectar-thick liquid Liquids provided via: Cup Medication Administration: Crushed with puree Supervision: Staff to assist with self feeding;Full supervision/cueing for compensatory strategies;Trained caregiver to feed patient Compensations: Minimize environmental distractions;Slow rate;Small sips/bites;Multiple dry swallows after each bite/sip;Check for pocketing Postural Changes and/or Swallow Maneuvers: Seated upright 90 degrees              Oral Care Recommendations: Oral care BID;Oral care before and after PO;Staff/trained caregiver to provide oral care Follow up Recommendations: Home health SLP (TBD) Plan: Continue with current plan of care (while admitted; at discharge TBD)    [place], [place], CCC-SLP  Kent [name] 05/19/2015, 2:24 PM

## 2015-05-19 NOTE — Discharge Instructions (Signed)
Pureed with Nectar Thick liquid Pneumonia, Adult Pneumonia is an infection of the lungs. It may be caused by a germ (virus or bacteria). Some types of pneumonia can spread easily from person to person. This can happen when you cough or sneeze. HOME CARE  Only take medicine as told by your doctor.  Take your medicine (antibiotics) as told. Finish it even if you start to feel better.  Do not smoke.  You may use a vaporizer or humidifier in your room. This can help loosen thick spit (mucus).  Sleep so you are almost sitting up (semi-upright). This helps reduce coughing.  Rest. A shot (vaccine) can help prevent pneumonia. Shots are often advised for:  People over 2 years old.  Patients on chemotherapy.  People with long-term (chronic) lung problems.  People with immune system problems. GET HELP RIGHT AWAY IF:   You are getting worse.  You cannot control your cough, and you are losing sleep.  You cough up blood.  Your pain gets worse, even with medicine.  You have a fever.  Any of your problems are getting worse, not better.  You have shortness of breath or chest pain. MAKE SURE YOU:   Understand these instructions.  Will watch your condition.  Will get help right away if you are not doing well or get worse. Document Released: 02/14/2008 Document Revised: 11/20/2011 Document Reviewed: 11/18/2010 [facility] Patient Information 2015 [place], [place]. This information is not intended to replace advice given to you by your health care provider. Make sure you discuss any questions you have with your health care provider.

## 2015-05-19 NOTE — Plan of Care (Signed)
Problem: Discharge Progression Outcomes Goal: Other Discharge Outcomes/Goals Outcome: Progressing Plan of care progress to goal: VSS Pt has no pain Pt on Dys 1 diet and taking meds crushed with applesauce

## 2015-05-19 NOTE — Discharge Summary (Signed)
[facility] at [place] NAME: Kent [name]    MR#:  [id number]  DATE OF BIRTH:  05/18/1966  DATE OF ADMISSION:  05/14/2015 ADMITTING PHYSICIAN: Baxter Hire, MD  DATE OF DISCHARGE: 05/19/15  PRIMARY CARE PHYSICIAN: Ashok Norris, MD    ADMISSION DIAGNOSIS:  Hypoxia [R09.02] Bilateral pneumonia [J18.9] Catheter-associated urinary tract infection [T83.51XA, N39.0]  DISCHARGE DIAGNOSIS:  Aspiration Pneumonia Chronic foley catheter  SECONDARY DIAGNOSIS:   Past Medical History  Diagnosis Date  . Frontotemporal dementia   . Sleep apnea   . Hypertension   . Depression   . Neurogenic bladder   . Myelopathy   . Obesity   . Degeneration, intervertebral disc, cervical   . Elevated liver function tests   . Pulmonary embolus     on chronic anticoagulation  . Chronic UTI (urinary tract infection)   . Pernicious anemia   . Rosacea   . BPH (benign prostatic hypertrophy)   . History of DVT (deep vein thrombosis)   . History of pulmonary embolism     currently on coumadin  . Anxiety   . New onset a-fib 2013    HOSPITAL COURSE:   1. Acute respiratory failure with hypoxia now on 2 L nasal cannula improving 2. Pneumonia bilaterally worse on the right than the left. Leukocytosis, fever and tachycardia. Most likely secondary to aspiration Meets criteria for clinical sepsis. Patient on aggressive antibiotics with meropenem and Levaquin. D/cIV Solu-Medrol for bronchoconstriction sicne pt imprvovoing and continue budesonide nebulizers and DuoNeb nebulizer. Blood cultures are negative to date.change to po levaquin for another 7 days 3. Chronic urinary tract infection with suprapubic catheter.urine culture with Pseudomonas, pansensitive continue antibiotics . Urology changed his suprapubic catheter 4. History of pulmonary embolism- INR 1.7. coumdain resumed home dosing 5.  History of frontal temporal dementia 6. Essential hypertension-  continue usual medications 7. Sleep apnea obesity 8. Nutrition spoke with speech therapist and pt from previous evals is a HIGH risk for aspiration. Spoke with mother and father about option for PEG feeding knowing it does not completely take with risk down. They are not eager with it and want to see if re-feeding helps hoim. They do understand doing this can worsen his PNEumonia. Overall improving. Vitals for now stable. Spoke with pt's father at length and they would like to cont oral feeds. Not interested in PEG tube feeding at present Pt was seen by Dr Megan Salon and is now DNR DISCHARGE CONDITIONS:   Stable. long term poor prognsosis  CONSULTS OBTAINED:  Treatment Team:  Nicholes Mango, MD  DRUG ALLERGIES:   Allergies  Allergen Reactions  . Codeine Nausea Only  . Piperacillin-Tazobactam In Dex Rash and Other (See Comments)    Arm erythema on administration, but tolerated cephalosporins fine.    . Vancomycin Rash and Other (See Comments)    Reaction:  Red man syndrome   . Zosyn [Piperacillin Sod-Tazobactam So] Rash and Other (See Comments)    Pt experienced arm erythema on administration, but tolerated cephalosporins fine.      DISCHARGE MEDICATIONS:   Current Discharge Medication List    START taking these medications   Details  ALPRAZolam (XANAX) 0.25 MG tablet Take 1 tablet (0.25 mg total) by mouth 3 (three) times daily as needed for anxiety. Qty: 30 tablet, Refills: 0    levofloxacin (LEVAQUIN) 500 MG tablet Take 1 tablet (500 mg total) by mouth daily. Qty: 7 tablet, Refills: 0    URELLE (URELLE/URISED) 81  MG TABS tablet Take 1 tablet (81 mg total) by mouth 2 (two) times daily. Qty: 120 each, Refills: 0      CONTINUE these medications which have NOT CHANGED   Details  benzonatate (TESSALON) 100 MG capsule Take 100 mg by mouth every 8 (eight) hours as needed for cough.    citalopram (CELEXA) 20 MG tablet Take 20 mg by mouth 2 (two) times daily.    clindamycin (CLEOCIN  T) 1 % external solution Apply 1 application topically 2 (two) times daily. Pt applies to scalp.    cyanocobalamin (,VITAMIN B-12,) 1000 MCG/ML injection Inject 500 mcg into the muscle every 30 (thirty) days.    dextromethorphan (DELSYM) 30 MG/5ML liquid Take 90 mg by mouth 2 (two) times daily as needed for cough.     flecainide (TAMBOCOR) 50 MG tablet Take 50 mg by mouth 2 (two) times daily.    fluticasone (FLONASE) 50 MCG/ACT nasal spray Place 1 spray into both nostrils 2 (two) times daily.    gluconic acid-citric acid (RENACIDIN) irrigation Irrigate with 30 mLs as directed 3 (three) times daily.    guaiFENesin (MUCINEX) 600 MG 12 hr tablet Take 600 mg by mouth 2 (two) times daily.     HYDROcodone-acetaminophen (NORCO/VICODIN) 5-325 MG per tablet Take 1-2 tablets by mouth every 6 (six) hours as needed for moderate pain.    ipratropium-albuterol (DUONEB) 0.5-2.5 (3) MG/3ML SOLN Take 3 mLs by nebulization 4 (four) times daily as needed (for shortness of breath).    ketoconazole (NIZORAL) 2 % shampoo Apply 1 application topically once a week. Pt uses on Friday.    loratadine (CLARITIN) 10 MG tablet Take 10 mg by mouth at bedtime.     LORazepam (ATIVAN) 0.5 MG tablet Take 0.5 mg by mouth 2 (two) times daily.    lovastatin (MEVACOR) 40 MG tablet Take 40 mg by mouth every evening.     Methen-Hyosc-Meth Blue-Na Phos (UROGESIC-BLUE) 81.6 MG TABS Take 1 tablet by mouth 2 (two) times daily.    metoprolol tartrate (LOPRESSOR) 25 MG tablet Take 12.5 mg by mouth daily at 12 noon.     nitrofurantoin (MACRODANTIN) 100 MG capsule Take 100 mg by mouth every evening.     omeprazole (PRILOSEC) 40 MG capsule Take 40 mg by mouth daily.     oxybutynin (DITROPAN) 5 MG tablet Take 5 mg by mouth 3 (three) times daily.    polyethylene glycol (MIRALAX / GLYCOLAX) packet Take 17 g by mouth daily.    QUEtiapine (SEROQUEL) 50 MG tablet Take 50 mg by mouth at bedtime.    warfarin (COUMADIN) 5 MG tablet  Take 0.5-1 tablets (2.5-5 mg total) by mouth daily. Pt alternates between 2.5 mg and 5 mg on an every other day basis. Qty: 30 tablet, Refills: 0      STOP taking these medications     clotrimazole-betamethasone (LOTRISONE) cream         If you experience worsening of your admission symptoms, develop shortness of breath, life threatening emergency, suicidal or homicidal thoughts you must seek medical attention immediately by calling 911 or calling your MD immediately  if symptoms less severe.  You Must read complete instructions/literature along with all the possible adverse reactions/side effects for all the Medicines you take and that have been prescribed to you. Take any new Medicines after you have completely understood and accept all the possible adverse reactions/side effects.   Please note  You were cared for by a hospitalist during your hospital stay. If  you have any questions about your discharge medications or the care you received while you were in the hospital after you are discharged, you can call the unit and asked to speak with the hospitalist on call if the hospitalist that took care of you is not available. Once you are discharged, your primary care physician will handle any further medical issues. Please note that NO REFILLS for any discharge medications will be authorized once you are discharged, as it is imperative that you return to your primary care physician (or establish a relationship with a primary care physician if you do not have one) for your aftercare needs so that they can reassess your need for medications and monitor your lab values. Today   SUBJECTIVE   Non verbal. More alert and responding to sounds  VITAL SIGNS:  Blood pressure 122/84, pulse 86, temperature 98.4 F (36.9 C), temperature source Oral, resp. rate 20, height 5\' 11"  (1.803 m), weight 132 kg (291 lb 0.1 oz), SpO2 91 %.  I/O:   Intake/Output Summary (Last 24 hours) at 05/19/15 0937 Last  data filed at 05/19/15 0214  Gross per 24 hour  Intake    480 ml  Output    900 ml  Net   -420 ml    PHYSICAL EXAMINATION:   GENERAL: 49 y.o.-year-old patient lying in the bed with no acute distress. obese EYES: Pupils equal, round, reactive to light and accommodation. No scleral icterus. Extraocular muscles intact.  HEENT: Head atraumatic, normocephalic. Oropharynx and nasopharynx clear. Mucosa dry NECK: Supple, no jugular venous distention. No thyroid enlargement, no tenderness.  LUNGS: coarse breath sounds bilaterally, no wheezing, rales, rhonchi. No use of accessory muscles of respiration.  CARDIOVASCULAR: S1, S2 normal. No murmurs, rubs, or gallops.  ABDOMEN: Soft, nontender, nondistended. Bowel sounds present. No organomegaly or mass. Obese, foley catheter + EXTREMITIES: No cyanosis, clubbing , ++ edema b/l.  NEUROLOGIC: unable to assess -Psychiatry: unable to assess due to severe dementia SKIN: No obvious rash, lesion, or ulcer DATA REVIEW:   CBC   Recent Labs Lab 05/17/15 0514  WBC 5.7  HGB 11.0*  HCT 33.6*  PLT 252    Chemistries   Recent Labs Lab 05/14/15 1209  05/18/15 0439  NA 136  < > 145  K 3.7  < > 3.4*  CL 102  < > 112*  CO2 27  < > 26  GLUCOSE 99  < > 152*  BUN 12  < > 25*  CREATININE 0.48*  < > 0.40*  CALCIUM 9.2  < > 8.6*  AST 32  --   --   ALT 38  --   --   ALKPHOS 84  --   --   BILITOT 1.0  --   --   < > = values in this interval not displayed.  Microbiology Results   Recent Results (from the past 240 hour(s))  Urine culture     Status: None   Collection Time: 05/14/15 12:39 PM  Result Value Ref Range Status   Specimen Description URINE, CLEAN CATCH  Final   Special Requests Normal  Final   Culture >=100,000 COLONIES/mL PSEUDOMONAS AERUGINOSA  Final   Report Status 05/16/2015 FINAL  Final   Organism ID, Bacteria PSEUDOMONAS AERUGINOSA  Final      Susceptibility   Pseudomonas aeruginosa - MIC*    CEFTAZIDIME 2 SENSITIVE  Sensitive     CIPROFLOXACIN <=0.25 SENSITIVE Sensitive     GENTAMICIN 4 SENSITIVE Sensitive  IMIPENEM 1 SENSITIVE Sensitive     PIP/TAZO Value in next row Sensitive      SENSITIVE<=4    * >=100,000 COLONIES/mL PSEUDOMONAS AERUGINOSA  Culture, blood (routine x 2)     Status: None   Collection Time: 05/14/15  3:21 PM  Result Value Ref Range Status   Specimen Description BLOOD LEFT FOREARM  Final   Special Requests   Final    BOTTLES DRAWN AEROBIC AND ANAEROBIC  AER 2CC ANA 4CC   Culture NO GROWTH 5 DAYS  Final   Report Status 05/19/2015 FINAL  Final  Culture, blood (routine x 2)     Status: None   Collection Time: 05/14/15  3:21 PM  Result Value Ref Range Status   Specimen Description BLOOD RIGHT ASSIST CONTROL  Final   Special Requests   Final    BOTTLES DRAWN AEROBIC AND ANAEROBIC  AER 12 CC ANA [place]   Culture NO GROWTH 5 DAYS  Final   Report Status 05/19/2015 FINAL  Final    RADIOLOGY:  No results found.   Management plans discussed with the  family and they are in agreement.  CODE STATUS:     Code Status Orders        Start     Ordered   05/19/15 0106  Do not attempt resuscitation (DNR)   Continuous    Question Answer Comment  In the event of cardiac or respiratory ARREST Do not call a "code blue"   In the event of cardiac or respiratory ARREST Do not perform Intubation, CPR, defibrillation or ACLS   In the event of cardiac or respiratory ARREST Use medication by any route, position, wound care, and other measures to relive pain and suffering. May use oxygen, suction and manual treatment of airway obstruction as needed for comfort.      05/19/15 0106    Advance Directive Documentation        Most Recent Value   Type of Advance Directive  Healthcare Power of Attorney, Living will   Pre-existing out of facility DNR order (yellow form or pink MOST form)     "MOST" Form in Place?        TOTAL TIME TAKING CARE OF THIS PATIENT: 40 minutes.    Braheem Tomasik M.D on  05/19/2015 at 9:37 AM  Between 7am to 6pm - Pager - [phone] After 6pm go to [email or web address] - password [email or web address] [facility]  Office  [phone]  CC: Primary care physician; Ashok Norris, MD

## 2015-05-20 ENCOUNTER — Encounter: Payer: Self-pay | Admitting: Emergency Medicine

## 2015-05-20 ENCOUNTER — Other Ambulatory Visit: Payer: Self-pay | Admitting: Emergency Medicine

## 2015-05-20 ENCOUNTER — Telehealth: Payer: Self-pay | Admitting: Family Medicine

## 2015-05-20 MED ORDER — FUROSEMIDE 20 MG PO TABS: 20.0000 mg | ORAL_TABLET | Freq: Every day | ORAL | Status: DC

## 2015-05-20 NOTE — Telephone Encounter (Signed)
Patient was referred to Hospice in July but father refused at the time. Now dad Smola) has decided to use there services. Is it okay to use the order from July. Please return Hilda Blades call from [place] [phone]

## 2015-05-20 NOTE — Telephone Encounter (Signed)
Christy at [facility] called and stated that Mr Kent [name]. Would like to see if Kent [name] can start on oxygen.  Alyse Low stated she has spoke with her supervisor to see if he would even qualify for it since he is confined to a bed. Dr. Rutherford Nail asked them to also get a overnight  oximetry to see what his perimeters maybe. Dr. Rutherford Nail also stated to call in Lasix 20 mg qd for 5 days to pharmacy for the swelling and edema that the patient has all over his body and asked Alyse Low to go back for recheck on patient on 05/24/2015

## 2015-05-20 NOTE — Telephone Encounter (Signed)
yes

## 2015-05-20 NOTE — Telephone Encounter (Signed)
Called and informed Hilda Blades it was ok to use that order.

## 2015-05-24 ENCOUNTER — Other Ambulatory Visit: Payer: Self-pay | Admitting: Emergency Medicine

## 2015-05-24 NOTE — Telephone Encounter (Signed)
Xanax 0.25 mg was called into [facility] by Dr.Morrisey. Patients dad called and stated script was not signed when he was discharged from hospital. [facility] stated [facility] with no signature.

## 2015-05-25 ENCOUNTER — Other Ambulatory Visit: Payer: Self-pay | Admitting: Family Medicine

## 2015-05-28 ENCOUNTER — Other Ambulatory Visit: Payer: Self-pay | Admitting: Family Medicine

## 2015-05-30 ENCOUNTER — Other Ambulatory Visit: Payer: Self-pay | Admitting: Family Medicine

## 2015-06-01 ENCOUNTER — Ambulatory Visit: Payer: Self-pay | Admitting: Family Medicine

## 2015-06-03 ENCOUNTER — Ambulatory Visit: Payer: Self-pay | Admitting: Family Medicine

## 2015-06-08 ENCOUNTER — Ambulatory Visit: Payer: Self-pay | Admitting: Family Medicine

## 2015-06-08 ENCOUNTER — Telehealth: Payer: Self-pay | Admitting: Family Medicine

## 2015-06-08 NOTE — Telephone Encounter (Signed)
Order was called to Hospice

## 2015-06-08 NOTE — Telephone Encounter (Signed)
Kent [name] from Hospice states family is requesting a PT/INR checkn today, therefore, Kent [name] is needing a order

## 2015-06-10 ENCOUNTER — Encounter: Payer: Self-pay | Admitting: Family Medicine

## 2015-06-24 ENCOUNTER — Telehealth: Payer: Self-pay | Admitting: Family Medicine

## 2015-06-24 NOTE — Telephone Encounter (Signed)
Called and gave verbal order for Flu shot

## 2015-06-24 NOTE — Telephone Encounter (Signed)
Kent [name] from [facility] is requesting order for flu shot.  (P) [phone] (F) [phone]

## 2015-07-08 ENCOUNTER — Other Ambulatory Visit: Payer: Self-pay | Admitting: Family Medicine

## 2015-07-13 ENCOUNTER — Telehealth: Payer: Self-pay | Admitting: Family Medicine

## 2015-07-13 ENCOUNTER — Encounter: Payer: Self-pay | Admitting: Emergency Medicine

## 2015-07-15 NOTE — Telephone Encounter (Signed)
Spoke to Kent [name] the dad today. He is requesting eye drops and cream for a rash. Patient is under Hospice care.  Informed him that the Hospice nurse will have to call and make request for additional medication. We cannot give him medication prescribed from another doctor over 6 months ago when Kent [name] was inpatient. Kent [name] was informed to relay concerns to Hospice nurse and have them to call office.

## 2015-07-29 ENCOUNTER — Other Ambulatory Visit: Payer: Self-pay | Admitting: Family Medicine

## 2015-08-04 ENCOUNTER — Telehealth: Payer: Self-pay | Admitting: Family Medicine

## 2015-08-04 NOTE — Telephone Encounter (Signed)
HOSPICE SAYS HAS VERY CONGESTIVE COUGH THAT HAS GOTTEN WORSE THIS WEEK WITH A LOT OF MOANING AND FAMILY SAYS THE LAST TIME HE DID THIS HE HAD PNEUMONIA. WANTS TO KNOW IF MORRISEY WILL TREAT. THEY ARE GIVING HIM DELSYMIN COUGH SYRUP WITH TESS PEALS FOR THE COUGH. [facility]ARM IS [facility] [place].

## 2015-08-04 NOTE — Telephone Encounter (Signed)
Crystal notified to have patient go to ER

## 2015-08-09 ENCOUNTER — Inpatient Hospital Stay
Admission: EM | Admit: 2015-08-09 | Discharge: 2015-08-16 | DRG: 871 | Disposition: A | Discharge status: Home / Self Care | Attending: Internal Medicine | Admitting: Internal Medicine

## 2015-08-09 ENCOUNTER — Emergency Department

## 2015-08-09 ENCOUNTER — Encounter: Payer: Self-pay | Admitting: Emergency Medicine

## 2015-08-09 DIAGNOSIS — G319 Degenerative disease of nervous system, unspecified: Secondary | ICD-10-CM

## 2015-08-09 DIAGNOSIS — R4182 Altered mental status, unspecified: Secondary | ICD-10-CM

## 2015-08-09 DIAGNOSIS — F329 Major depressive disorder, single episode, unspecified: Secondary | ICD-10-CM | POA: Diagnosis present

## 2015-08-09 DIAGNOSIS — Z515 Encounter for palliative care: Secondary | ICD-10-CM | POA: Diagnosis present

## 2015-08-09 DIAGNOSIS — Z87442 Personal history of urinary calculi: Secondary | ICD-10-CM

## 2015-08-09 DIAGNOSIS — R6510 Systemic inflammatory response syndrome (SIRS) of non-infectious origin without acute organ dysfunction: Secondary | ICD-10-CM | POA: Diagnosis present

## 2015-08-09 DIAGNOSIS — Z86711 Personal history of pulmonary embolism: Secondary | ICD-10-CM | POA: Diagnosis not present

## 2015-08-09 DIAGNOSIS — A419 Sepsis, unspecified organism: Secondary | ICD-10-CM

## 2015-08-09 DIAGNOSIS — Z888 Allergy status to other drugs, medicaments and biological substances status: Secondary | ICD-10-CM | POA: Diagnosis not present

## 2015-08-09 DIAGNOSIS — Z9359 Other cystostomy status: Secondary | ICD-10-CM

## 2015-08-09 DIAGNOSIS — G9341 Metabolic encephalopathy: Secondary | ICD-10-CM | POA: Diagnosis present

## 2015-08-09 DIAGNOSIS — F0280 Dementia in other diseases classified elsewhere without behavioral disturbance: Secondary | ICD-10-CM | POA: Diagnosis present

## 2015-08-09 DIAGNOSIS — R0902 Hypoxemia: Secondary | ICD-10-CM

## 2015-08-09 DIAGNOSIS — N390 Urinary tract infection, site not specified: Secondary | ICD-10-CM | POA: Diagnosis present

## 2015-08-09 DIAGNOSIS — Z886 Allergy status to analgesic agent status: Secondary | ICD-10-CM

## 2015-08-09 DIAGNOSIS — Z7951 Long term (current) use of inhaled steroids: Secondary | ICD-10-CM

## 2015-08-09 DIAGNOSIS — J9601 Acute respiratory failure with hypoxia: Secondary | ICD-10-CM | POA: Diagnosis present

## 2015-08-09 DIAGNOSIS — N400 Enlarged prostate without lower urinary tract symptoms: Secondary | ICD-10-CM | POA: Diagnosis present

## 2015-08-09 DIAGNOSIS — E669 Obesity, unspecified: Secondary | ICD-10-CM | POA: Diagnosis present

## 2015-08-09 DIAGNOSIS — G4730 Sleep apnea, unspecified: Secondary | ICD-10-CM | POA: Diagnosis present

## 2015-08-09 DIAGNOSIS — F419 Anxiety disorder, unspecified: Secondary | ICD-10-CM | POA: Diagnosis present

## 2015-08-09 DIAGNOSIS — G3109 Other frontotemporal dementia: Secondary | ICD-10-CM | POA: Diagnosis present

## 2015-08-09 DIAGNOSIS — F0390 Unspecified dementia without behavioral disturbance: Secondary | ICD-10-CM

## 2015-08-09 DIAGNOSIS — I4891 Unspecified atrial fibrillation: Secondary | ICD-10-CM | POA: Diagnosis present

## 2015-08-09 DIAGNOSIS — Z87440 Personal history of urinary (tract) infections: Secondary | ICD-10-CM

## 2015-08-09 DIAGNOSIS — N319 Neuromuscular dysfunction of bladder, unspecified: Secondary | ICD-10-CM | POA: Diagnosis present

## 2015-08-09 DIAGNOSIS — J690 Pneumonitis due to inhalation of food and vomit: Secondary | ICD-10-CM | POA: Diagnosis present

## 2015-08-09 DIAGNOSIS — Z8249 Family history of ischemic heart disease and other diseases of the circulatory system: Secondary | ICD-10-CM

## 2015-08-09 DIAGNOSIS — Z7401 Bed confinement status: Secondary | ICD-10-CM

## 2015-08-09 DIAGNOSIS — R509 Fever, unspecified: Secondary | ICD-10-CM | POA: Diagnosis present

## 2015-08-09 DIAGNOSIS — R609 Edema, unspecified: Secondary | ICD-10-CM | POA: Diagnosis present

## 2015-08-09 DIAGNOSIS — Z6841 Body Mass Index (BMI) 40.0 and over, adult: Secondary | ICD-10-CM

## 2015-08-09 DIAGNOSIS — Z66 Do not resuscitate: Secondary | ICD-10-CM | POA: Diagnosis present

## 2015-08-09 DIAGNOSIS — Z86718 Personal history of other venous thrombosis and embolism: Secondary | ICD-10-CM

## 2015-08-09 DIAGNOSIS — R0602 Shortness of breath: Secondary | ICD-10-CM

## 2015-08-09 DIAGNOSIS — Z7901 Long term (current) use of anticoagulants: Secondary | ICD-10-CM | POA: Diagnosis not present

## 2015-08-09 DIAGNOSIS — Z79899 Other long term (current) drug therapy: Secondary | ICD-10-CM | POA: Diagnosis not present

## 2015-08-09 DIAGNOSIS — A4152 Sepsis due to Pseudomonas: Principal | ICD-10-CM | POA: Diagnosis present

## 2015-08-09 LAB — CBC
HEMATOCRIT: 33.7 % — AB (ref 40.0–52.0)
Hemoglobin: 10.9 g/dL — ABNORMAL LOW (ref 13.0–18.0)
MCH: 28.3 pg (ref 26.0–34.0)
MCHC: 32.5 g/dL (ref 32.0–36.0)
MCV: 87 fL (ref 80.0–100.0)
Platelets: 230 10*3/uL (ref 150–440)
RBC: 3.87 MIL/uL — AB (ref 4.40–5.90)
RDW: 18.1 % — AB (ref 11.5–14.5)
WBC: 11.5 10*3/uL — AB (ref 3.8–10.6)

## 2015-08-09 LAB — URINALYSIS COMPLETE WITH MICROSCOPIC (ARMC ONLY)
Bilirubin Urine: NEGATIVE
Glucose, UA: NEGATIVE mg/dL
KETONES UR: NEGATIVE mg/dL
Nitrite: POSITIVE — AB
PROTEIN: 30 mg/dL — AB
SQUAMOUS EPITHELIAL / LPF: NONE SEEN
Specific Gravity, Urine: 1.02 (ref 1.005–1.030)
pH: 6 (ref 5.0–8.0)

## 2015-08-09 LAB — PROTIME-INR
INR: 3.6
INR: 4.73
PROTHROMBIN TIME: 35.1 s — AB (ref 11.4–15.0)
Prothrombin Time: 43.1 seconds — ABNORMAL HIGH (ref 11.4–15.0)

## 2015-08-09 LAB — CBC WITH DIFFERENTIAL/PLATELET
Basophils Absolute: 0 10*3/uL (ref 0–0.1)
Basophils Relative: 0 %
Eosinophils Absolute: 0 10*3/uL (ref 0–0.7)
Eosinophils Relative: 0 %
HEMATOCRIT: 39.1 % — AB (ref 40.0–52.0)
Hemoglobin: 12.5 g/dL — ABNORMAL LOW (ref 13.0–18.0)
LYMPHS PCT: 1 %
Lymphs Abs: 0.1 10*3/uL — ABNORMAL LOW (ref 1.0–3.6)
MCH: 27.6 pg (ref 26.0–34.0)
MCHC: 31.9 g/dL — AB (ref 32.0–36.0)
MCV: 86.5 fL (ref 80.0–100.0)
MONO ABS: 0.9 10*3/uL (ref 0.2–1.0)
MONOS PCT: 7 %
NEUTROS ABS: 12.4 10*3/uL — AB (ref 1.4–6.5)
Neutrophils Relative %: 92 %
Platelets: 278 10*3/uL (ref 150–440)
RBC: 4.52 MIL/uL (ref 4.40–5.90)
RDW: 18.6 % — AB (ref 11.5–14.5)
WBC: 13.5 10*3/uL — ABNORMAL HIGH (ref 3.8–10.6)

## 2015-08-09 LAB — BASIC METABOLIC PANEL
Anion gap: 5 (ref 5–15)
BUN: 13 mg/dL (ref 6–20)
CHLORIDE: 108 mmol/L (ref 101–111)
CO2: 25 mmol/L (ref 22–32)
Calcium: 7.4 mg/dL — ABNORMAL LOW (ref 8.9–10.3)
Glucose, Bld: 126 mg/dL — ABNORMAL HIGH (ref 65–99)
POTASSIUM: 3.7 mmol/L (ref 3.5–5.1)
SODIUM: 138 mmol/L (ref 135–145)

## 2015-08-09 LAB — COMPREHENSIVE METABOLIC PANEL
ALBUMIN: 3.4 g/dL — AB (ref 3.5–5.0)
ALK PHOS: 84 U/L (ref 38–126)
ALT: 42 U/L (ref 17–63)
ANION GAP: 7 (ref 5–15)
AST: 31 U/L (ref 15–41)
BUN: 15 mg/dL (ref 6–20)
CO2: 25 mmol/L (ref 22–32)
Calcium: 8.5 mg/dL — ABNORMAL LOW (ref 8.9–10.3)
Chloride: 105 mmol/L (ref 101–111)
Creatinine, Ser: 0.39 mg/dL — ABNORMAL LOW (ref 0.61–1.24)
GFR calc Af Amer: >60 mL/min (ref >60)
GFR calc non Af Amer: >60 mL/min (ref >60)
GLUCOSE: 117 mg/dL — AB (ref 65–99)
POTASSIUM: 4.2 mmol/L (ref 3.5–5.1)
SODIUM: 137 mmol/L (ref 135–145)
Total Bilirubin: 0.7 mg/dL (ref 0.3–1.2)
Total Protein: 6.9 g/dL (ref 6.5–8.1)

## 2015-08-09 LAB — BLOOD GAS, VENOUS
Acid-Base Excess: 2.2 mmol/L (ref 0.0–3.0)
BICARBONATE: 27.3 meq/L (ref 21.0–28.0)
PATIENT TEMPERATURE: 37
pCO2, Ven: 43 mmHg — ABNORMAL LOW (ref 44.0–60.0)
pH, Ven: 7.41 (ref 7.320–7.430)

## 2015-08-09 LAB — TROPONIN I: Troponin I: <0.03 ng/mL (ref <0.031)

## 2015-08-09 LAB — LACTIC ACID, PLASMA
LACTIC ACID, VENOUS: 1.5 mmol/L (ref 0.5–2.0)
LACTIC ACID, VENOUS: 1.9 mmol/L (ref 0.5–2.0)

## 2015-08-09 MED ORDER — FLUTICASONE PROPIONATE 50 MCG/ACT NA SUSP
1.0000 | Freq: Two times a day (BID) | NASAL | Status: DC
  Administered 2015-08-09 – 2015-08-15 (×11): 1 via NASAL
  Filled 2015-08-09: qty 16

## 2015-08-09 MED ORDER — URELLE 81 MG PO TABS
1.0000 | ORAL_TABLET | Freq: Two times a day (BID) | ORAL | Status: DC
  Administered 2015-08-09: 81 mg via ORAL
  Filled 2015-08-09 (×2): qty 1

## 2015-08-09 MED ORDER — RENACIDIN IR SOLN
30.0000 mL | Freq: Three times a day (TID) | Status: DC
  Administered 2015-08-10 – 2015-08-16 (×19): 30 mL
  Filled 2015-08-09: qty 30
  Filled 2015-08-09: qty 500
  Filled 2015-08-09 (×21): qty 30

## 2015-08-09 MED ORDER — LEVOFLOXACIN IN D5W 750 MG/150ML IV SOLN
750.0000 mg | INTRAVENOUS | Status: DC
  Filled 2015-08-09: qty 150

## 2015-08-09 MED ORDER — POLYETHYLENE GLYCOL 3350 17 G PO PACK
17.0000 g | PACK | Freq: Every day | ORAL | Status: DC
  Administered 2015-08-09 – 2015-08-15 (×5): 17 g via ORAL
  Filled 2015-08-09 (×5): qty 1

## 2015-08-09 MED ORDER — ALPRAZOLAM 0.25 MG PO TABS
0.2500 mg | ORAL_TABLET | Freq: Three times a day (TID) | ORAL | Status: DC | PRN
  Administered 2015-08-10 – 2015-08-16 (×2): 0.25 mg via ORAL
  Filled 2015-08-09 (×2): qty 1

## 2015-08-09 MED ORDER — CLINDAMYCIN PHOSPHATE 1 % EX SOLN
Freq: Two times a day (BID) | CUTANEOUS | Status: DC
Start: 2015-08-09 — End: 2015-08-16
  Administered 2015-08-09 – 2015-08-15 (×12): via TOPICAL
  Filled 2015-08-09: qty 60

## 2015-08-09 MED ORDER — PANTOPRAZOLE SODIUM 40 MG PO TBEC
40.0000 mg | DELAYED_RELEASE_TABLET | Freq: Every day | ORAL | Status: DC
  Administered 2015-08-09 – 2015-08-15 (×6): 40 mg via ORAL
  Filled 2015-08-09 (×6): qty 1

## 2015-08-09 MED ORDER — CITALOPRAM HYDROBROMIDE 20 MG PO TABS
20.0000 mg | ORAL_TABLET | Freq: Two times a day (BID) | ORAL | Status: DC
  Administered 2015-08-09 – 2015-08-15 (×13): 20 mg via ORAL
  Filled 2015-08-09 (×13): qty 1

## 2015-08-09 MED ORDER — SODIUM CHLORIDE 0.9 % IV BOLUS (SEPSIS)
1000.0000 mL | INTRAVENOUS | Status: AC
  Administered 2015-08-09 (×3): 1000 mL via INTRAVENOUS

## 2015-08-09 MED ORDER — HYDROCODONE-ACETAMINOPHEN 5-325 MG PO TABS: 1.0000 | ORAL_TABLET | Freq: Four times a day (QID) | ORAL | Status: DC | PRN

## 2015-08-09 MED ORDER — ONDANSETRON HCL 4 MG/2ML IJ SOLN
4.0000 mg | Freq: Once | INTRAMUSCULAR | Status: AC
  Administered 2015-08-09: 4 mg via INTRAVENOUS
  Filled 2015-08-09: qty 2

## 2015-08-09 MED ORDER — ONDANSETRON HCL 4 MG PO TABS: 4.0000 mg | ORAL_TABLET | Freq: Four times a day (QID) | ORAL | Status: DC | PRN

## 2015-08-09 MED ORDER — QUETIAPINE FUMARATE 25 MG PO TABS
50.0000 mg | ORAL_TABLET | Freq: Every day | ORAL | Status: DC
  Administered 2015-08-09 – 2015-08-15 (×7): 50 mg via ORAL
  Filled 2015-08-09 (×7): qty 2

## 2015-08-09 MED ORDER — FLECAINIDE ACETATE 50 MG PO TABS
50.0000 mg | ORAL_TABLET | Freq: Two times a day (BID) | ORAL | Status: DC
  Administered 2015-08-09 – 2015-08-15 (×13): 50 mg via ORAL
  Filled 2015-08-09 (×13): qty 1

## 2015-08-09 MED ORDER — ACETAMINOPHEN 325 MG RE SUPP
925.0000 mg | Freq: Once | RECTAL | Status: AC
  Administered 2015-08-09: 925 mg via RECTAL
  Filled 2015-08-09: qty 1

## 2015-08-09 MED ORDER — PRAVASTATIN SODIUM 20 MG PO TABS
20.0000 mg | ORAL_TABLET | Freq: Every day | ORAL | Status: DC
  Administered 2015-08-09 – 2015-08-15 (×7): 20 mg via ORAL
  Filled 2015-08-09 (×7): qty 1

## 2015-08-09 MED ORDER — OXYBUTYNIN CHLORIDE 5 MG PO TABS
5.0000 mg | ORAL_TABLET | Freq: Three times a day (TID) | ORAL | Status: DC
  Administered 2015-08-09 – 2015-08-15 (×19): 5 mg via ORAL
  Filled 2015-08-09 (×19): qty 1

## 2015-08-09 MED ORDER — CLINDAMYCIN PHOSPHATE 1 % EX SOLN
1.0000 "application " | Freq: Two times a day (BID) | CUTANEOUS | Status: DC
  Filled 2015-08-09: qty 30

## 2015-08-09 MED ORDER — CLOTRIMAZOLE 1 % EX CREA
TOPICAL_CREAM | Freq: Two times a day (BID) | CUTANEOUS | Status: DC
  Administered 2015-08-09 – 2015-08-15 (×10): via TOPICAL
  Filled 2015-08-09: qty 15

## 2015-08-09 MED ORDER — UROGESIC-BLUE 81.6 MG PO TABS: 1.0000 | ORAL_TABLET | Freq: Two times a day (BID) | ORAL | Status: DC

## 2015-08-09 MED ORDER — KETOCONAZOLE 2 % EX SHAM
1.0000 "application " | MEDICATED_SHAMPOO | CUTANEOUS | Status: DC
  Administered 2015-08-13: 1 via TOPICAL
  Filled 2015-08-09: qty 120

## 2015-08-09 MED ORDER — IPRATROPIUM-ALBUTEROL 0.5-2.5 (3) MG/3ML IN SOLN
3.0000 mL | Freq: Four times a day (QID) | RESPIRATORY_TRACT | Status: DC | PRN
  Administered 2015-08-12 (×2): 3 mL via RESPIRATORY_TRACT
  Filled 2015-08-09 (×2): qty 3

## 2015-08-09 MED ORDER — LEVOFLOXACIN IN D5W 750 MG/150ML IV SOLN
750.0000 mg | Freq: Once | INTRAVENOUS | Status: AC
  Administered 2015-08-09: 750 mg via INTRAVENOUS
  Filled 2015-08-09: qty 150

## 2015-08-09 MED ORDER — ONDANSETRON HCL 4 MG/2ML IJ SOLN: 4.0000 mg | Freq: Four times a day (QID) | INTRAMUSCULAR | Status: DC | PRN

## 2015-08-09 MED ORDER — GUAIFENESIN ER 600 MG PO TB12
600.0000 mg | ORAL_TABLET | Freq: Two times a day (BID) | ORAL | Status: DC
  Administered 2015-08-09 – 2015-08-15 (×13): 600 mg via ORAL
  Filled 2015-08-09 (×14): qty 1

## 2015-08-09 MED ORDER — DEXTROSE 5 % IV SOLN
1.0000 g | INTRAVENOUS | Status: DC
  Administered 2015-08-09 – 2015-08-10 (×2): 1 g via INTRAVENOUS
  Filled 2015-08-09 (×4): qty 10

## 2015-08-09 MED ORDER — METOPROLOL TARTRATE 25 MG PO TABS
12.5000 mg | ORAL_TABLET | Freq: Every day | ORAL | Status: DC
  Administered 2015-08-09 – 2015-08-15 (×6): 12.5 mg via ORAL
  Filled 2015-08-09 (×6): qty 1

## 2015-08-09 MED ORDER — SODIUM CHLORIDE 0.9 % IV SOLN
INTRAVENOUS | Status: DC
  Administered 2015-08-09: 05:00:00 via INTRAVENOUS

## 2015-08-09 MED ORDER — SODIUM CHLORIDE 0.9 % IJ SOLN
3.0000 mL | Freq: Two times a day (BID) | INTRAMUSCULAR | Status: DC
  Administered 2015-08-09 – 2015-08-15 (×12): 3 mL via INTRAVENOUS

## 2015-08-09 NOTE — ED Provider Notes (Signed)
[facility] Emergency Department Provider Note  ____________________________________________  Time seen: Approximately 12:34 AM  I have reviewed the triage vital signs and the nursing notes.   HISTORY  Chief Complaint Altered Mental Status  History limited secondary to patient's baseline nonverbal status.  HPI Kent [name] is a 49 y.o. male who presents to the ED from home via EMS for altered mental status.Patient is on home hospice at home. Baseline is nonverbal and nonambulatory. Family called EMS for altered mental status and vomiting along with decreased urine output in his Foley catheter bag. On arrival patient is diaphoretic, hot to touch, tracks this examiner, moaning. Patient has green substance in his oropharynx which family told EMS is secondary to his bladder medication.   Past Medical History  Diagnosis Date  . Frontotemporal dementia   . Sleep apnea   . Hypertension   . Depression   . Neurogenic bladder   . Myelopathy ([place])   . Obesity   . Degeneration, intervertebral disc, cervical   . Elevated liver function tests   . Pulmonary embolus (HCC)     on chronic anticoagulation  . Chronic UTI (urinary tract infection)   . Pernicious anemia   . Rosacea   . BPH (benign prostatic hypertrophy)   . History of DVT (deep vein thrombosis)   . History of pulmonary embolism     currently on coumadin  . Anxiety   . New onset a-fib [facility]) 2013    Patient Active Problem List   Diagnosis Date Noted  . Pneumonia 05/14/2015  . Atrial fibrillation ([place]) 03/25/2015  . Hypothermia 03/22/2015  . Bacterial infection due to Pseudomonas 03/12/2015  . Frequent UTI 03/12/2015  . Cystitis 02/23/2015  . Pressure ulcer of buttock 02/03/2015  . Sepsis ([place]) 01/30/2015  . Neurogenic bladder 01/30/2015  . 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 Neurodegenerative disorder 01/30/2015  . GERD (gastroesophageal reflux disease) 01/30/2015  . UTI  (lower urinary tract infection) 01/30/2015  . Bladder infection, chronic 01/21/2015  . 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 Mechanical complication of urethral catheter ([place]) 12/06/2014  . Complications due to genitourinary device, implant, and graft ([place]) 11/05/2014  . 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 Incomplete bladder emptying 01/28/2014  . Disorder of nervous system 01/28/2014  . Calculus of kidney 06/30/2013  . Urge incontinence 06/30/2013  . Hematuria, microscopic 06/30/2013  . Dementia 08/21/2012  . Hypertension 08/21/2012  . Hyperlipidemia 08/21/2012  . History of pulmonary embolism 08/21/2012    Past Surgical History  Procedure Laterality Date  . Suprapubic catheter insertion    . Kidney stone surgery      Current Outpatient Rx  Name  Route  Sig  Dispense  Refill  . ALPRAZolam (XANAX) 0.25 MG tablet   Oral   Take 1 tablet (0.25 mg total) by mouth 3 (three) times daily as needed for anxiety.   30 tablet   0   . benzonatate (TESSALON) 100 MG capsule   Oral   Take 100 mg by mouth every 8 (eight) hours as needed for cough.         . benzonatate (TESSALON) 100 MG capsule      TAKE ONE CAPSULE BY MOUTH EVERY 8 HOURS AS NEEDED   30 capsule   0   . citalopram (CELEXA) 20 MG tablet   Oral   Take 20 mg by mouth 2 (two) times daily.         . clindamycin (CLEOCIN T) 1 % external  solution   Topical   Apply 1 application topically 2 (two) times daily. Pt applies to scalp.         . clotrimazole-betamethasone (LOTRISONE) cream      APPLY TO WOUND TWICE A DAY FOR 10 DAYS   45 g   0   . cyanocobalamin (,VITAMIN B-12,) 1000 MCG/ML injection   Intramuscular   Inject 500 mcg into the muscle every 30 (thirty) days.         Marland Kitchen dextromethorphan (DELSYM) 30 MG/5ML liquid   Oral   Take 90 mg by mouth 2 (two) times daily as needed for cough.          . erythromycin ophthalmic ointment      INSTILL 1 DROP INTO EACH EYE 3 TIMES A DAY   3.5 g   0   .  flecainide (TAMBOCOR) 50 MG tablet   Oral   Take 50 mg by mouth 2 (two) times daily.         . fluticasone (FLONASE) 50 MCG/ACT nasal spray   Each Nare   Place 1 spray into both nostrils 2 (two) times daily.         . furosemide (LASIX) 20 MG tablet   Oral   Take 1 tablet (20 mg total) by mouth daily.   5 tablet   0   . gluconic acid-citric acid (RENACIDIN) irrigation   Irrigation   Irrigate with 30 mLs as directed 3 (three) times daily.         Marland Kitchen guaiFENesin (MUCINEX) 600 MG 12 hr tablet   Oral   Take 600 mg by mouth 2 (two) times daily.          Marland Kitchen HYDROcodone-acetaminophen (NORCO/VICODIN) 5-325 MG per tablet   Oral   Take 1-2 tablets by mouth every 6 (six) hours as needed for moderate pain.         Marland Kitchen ipratropium-albuterol (DUONEB) 0.5-2.5 (3) MG/3ML SOLN   Nebulization   Take 3 mLs by nebulization 4 (four) times daily as needed (for shortness of breath).         Marland Kitchen ketoconazole (NIZORAL) 2 % shampoo   Topical   Apply 1 application topically once a week. Pt uses on Friday.         . levofloxacin (LEVAQUIN) 500 MG tablet   Oral   Take 1 tablet (500 mg total) by mouth daily.   7 tablet   0   . loratadine (CLARITIN) 10 MG tablet   Oral   Take 10 mg by mouth at bedtime.          Marland Kitchen LORazepam (ATIVAN) 0.5 MG tablet   Oral   Take 0.5 mg by mouth 2 (two) times daily.         Marland Kitchen lovastatin (MEVACOR) 40 MG tablet      TAKE 1 TABLET BY MOUTH DAILY   30 tablet   7   . Methen-Hyosc-Meth Blue-Na Phos (UROGESIC-BLUE) 81.6 MG TABS   Oral   Take 1 tablet by mouth 2 (two) times daily.         . metoprolol tartrate (LOPRESSOR) 25 MG tablet   Oral   Take 12.5 mg by mouth daily at 12 noon.          . nitrofurantoin (MACRODANTIN) 100 MG capsule   Oral   Take 100 mg by mouth every evening.          Marland Kitchen omeprazole (PRILOSEC) 40 MG capsule  TAKE ONE CAPSULE BY MOUTH EVERY DAY   30 capsule   11   . oxybutynin (DITROPAN) 5 MG tablet   Oral    Take 5 mg by mouth 3 (three) times daily.         . polyethylene glycol (MIRALAX / GLYCOLAX) packet   Oral   Take 17 g by mouth daily.         . QUEtiapine (SEROQUEL) 50 MG tablet   Oral   Take 50 mg by mouth at bedtime.         Marland Kitchen URELLE (URELLE/URISED) 81 MG TABS tablet   Oral   Take 1 tablet (81 mg total) by mouth 2 (two) times daily.   120 each   0   . warfarin (COUMADIN) 5 MG tablet   Oral   Take 0.5-1 tablets (2.5-5 mg total) by mouth daily. Pt alternates between 2.5 mg and 5 mg on an every other day basis.   30 tablet   0     Allergies Codeine; Piperacillin-tazobactam in dex; Vancomycin; and Zosyn  Family History  Problem Relation Age of Onset  . Hypertension Father   . Hyperlipidemia Father   . Hypertension Mother     Social History Social History  Substance Use Topics  . Smoking status: Never Smoker   . Smokeless tobacco: None  . Alcohol Use: No    Review of Systems Constitutional: No fever/chills Eyes: No visual changes. ENT: No sore throat. Cardiovascular: Denies chest pain. Respiratory: Denies shortness of breath. Gastrointestinal: No abdominal pain.  Positive for vomiting.  No diarrhea.  No constipation. Genitourinary: Positive for decreased urine in Foley bag. Musculoskeletal: Negative for back pain. Skin: Negative for rash. Neurological: Negative for headaches, focal weakness or numbness.  Limited by patient's nonverbal status; 10-point ROS otherwise negative.  ____________________________________________   PHYSICAL EXAM:  VITAL SIGNS: ED Triage Vitals  Enc Vitals Group     BP --      Pulse --      Resp --      Temp --      Temp src --      SpO2 --      Weight --      Height --      Head Cir --      Peak Flow --      Pain Score --      Pain Loc --      Pain Edu? --      Excl. in [place]? --     Constitutional: Alert, tracking this examiner with his eyes. Moaning and in moderate acute distress. Eyes: Conjunctivae are  normal. PERRL. EOMI. Head: Atraumatic. Nose: No congestion/rhinnorhea. Mouth/Throat: Mucous membranes are mildly dry.  Oropharynx non-erythematous. Green emesis in oropharynx. Neck: No stridor.   Cardiovascular: Normal rate, regular rhythm. Grossly normal heart sounds.  Good peripheral circulation. Respiratory: Normal respiratory effort.  No retractions. Lungs mildly diminished bilaterally. Gastrointestinal: Obese Soft and nontender. No distention. Suprapubic catheter noted. No abdominal bruits. No CVA tenderness. Musculoskeletal: BLE contractures. 1+ pitting edema BLE. No joint effusions. Neurologic:  Nonverbal at baseline, moaning, tracking this examiner.  Skin:  Skin is hot, diaphoretic and intact. No rash noted. Specifically, no petechiae.  ____________________________________________   LABS (all labs ordered are listed, but only abnormal results are displayed)  Labs Reviewed  COMPREHENSIVE METABOLIC PANEL - Abnormal; Notable for the following:    Glucose, Bld 117 (*)    Creatinine, Ser 0.39 (*)    Calcium  8.5 (*)    Albumin 3.4 (*)    All other components within normal limits  CBC WITH DIFFERENTIAL/PLATELET - Abnormal; Notable for the following:    WBC 13.5 (*)    Hemoglobin 12.5 (*)    HCT 39.1 (*)    MCHC 31.9 (*)    RDW 18.6 (*)    Neutro Abs 12.4 (*)    Lymphs Abs 0.1 (*)    All other components within normal limits  BLOOD GAS, VENOUS - Abnormal; Notable for the following:    pCO2, Ven 43 (*)    All other components within normal limits  URINALYSIS COMPLETEWITH MICROSCOPIC ([facility] ONLY) - Abnormal; Notable for the following:    Color, Urine BLUE (*)    APPearance CLOUDY (*)    Hgb urine dipstick 1+ (*)    Protein, ur 30 (*)    Nitrite POSITIVE (*)    Leukocytes, UA 3+ (*)    Bacteria, UA RARE (*)    All other components within normal limits  PROTIME-INR - Abnormal; Notable for the following:    Prothrombin Time 35.1 (*)    All other components within normal  limits  CULTURE, BLOOD (ROUTINE X 2)  CULTURE, BLOOD (ROUTINE X 2)  URINE CULTURE  LACTIC ACID, PLASMA  TROPONIN I  LACTIC ACID, PLASMA   ____________________________________________  EKG  ED ECG REPORT I, SUNG,JADE J, the attending physician, personally viewed and interpreted this ECG.   Date: 08/09/2015  EKG Time: 0038  Rate: 99  Rhythm: normal EKG, normal sinus rhythm  Axis: Normal  Intervals:none  ST&T Change: ST depression V2 and V3  ____________________________________________  RADIOLOGY  Portable chest x-ray (viewed by me, interpreted per Dr. Alroy Dust): Negative limited study. ____________________________________________   PROCEDURES  Procedure(s) performed: None  Critical Care performed: Yes, see critical care note(s)  CRITICAL CARE Performed by: Paulette Blanch   Total critical care time: 45 minutes  Critical care time was exclusive of separately billable procedures and treating other patients.  Critical care was necessary to treat or prevent imminent or life-threatening deterioration.  Critical care was time spent personally by me on the following activities: development of treatment plan with patient and/or surrogate as well as nursing, discussions with consultants, evaluation of patient's response to treatment, examination of patient, obtaining history from patient or surrogate, ordering and performing treatments and interventions, ordering and review of laboratory studies, ordering and review of radiographic studies, pulse oximetry and re-evaluation of patient's condition. ____________________________________________   INITIAL IMPRESSION / ASSESSMENT AND PLAN / ED COURSE  Pertinent labs & imaging results that were available during my care of the patient were reviewed by me and considered in my medical decision making (see chart for details).  49 year old male presenting from home with altered mentation and decreased urine output. ED Code Sepsis called  immediately upon patient's arrival. Will initiate IV fluid resuscitation, obtain sepsis protocol lab work including lactate, urinalysis and chest x-ray. Anticipate hospital admission.   ----------------------------------------- 1:36 AM on 08/09/2015 -----------------------------------------  Family at bedside. Patient looks more perky, his eyes are open and he is actively looking around the room. Updated family of urinalysis results. After consultation with pharmacist, will initiate IV Levaquin in the emergency department. Discussed with hospitalist for evaluation in the ED for admission. ____________________________________________   FINAL CLINICAL IMPRESSION(S) / ED DIAGNOSES  Final diagnoses:  Sepsis, due to unspecified organism ([place])  Altered mental status, unspecified altered mental status type  UTI (lower urinary tract infection)  Fever, unspecified  fever cause      Paulette Blanch, MD 08/09/15 [phone]

## 2015-08-09 NOTE — Progress Notes (Signed)
Initial Nutrition Assessment   INTERVENTION:   Meals and Snacks: Cater to patient preferences; will send yogurt as 3pm snack as previously requested, likes eggs at breakfast  NUTRITION DIAGNOSIS:   No nutrition diagnosis at this time  GOAL:   Patient will meet greater than or equal to 90% of their needs  MONITOR:    (Energy Intake, Digestive System, Anthropometrics)  REASON FOR ASSESSMENT:    (dyshagia diet)    ASSESSMENT:    Pt admitted with AMS due to sepsis with UTI; pt non-verbal and bed bound, lives with mother and father  Past Medical History  Diagnosis Date  . Frontotemporal dementia   . Sleep apnea   . Hypertension   . Depression   . Neurogenic bladder   . Myelopathy ([place])   . Obesity   . Degeneration, intervertebral disc, cervical   . Elevated liver function tests   . Pulmonary embolus (HCC)     on chronic anticoagulation  . Chronic UTI (urinary tract infection)   . Pernicious anemia   . Rosacea   . BPH (benign prostatic hypertrophy)   . History of DVT (deep vein thrombosis)   . History of pulmonary embolism     currently on coumadin  . Anxiety   . New onset a-fib [facility]) 2013     Diet Order:  DIET - DYS 1 Room service appropriate?: Yes; Fluid consistency:: Nectar Thick ; this is baseline diet  Energy Intake: no recorded po intake, appetite generally very good, likes afternoon snack  Skin:  Reviewed, no issues  Last BM:   unknown  Electrolyte and Renal Profile:  Recent Labs Lab 08/09/15 0048 08/09/15 0529  BUN 15 13  CREATININE 0.39* <0.30*  NA 137 138  K 4.2 3.7   Glucose Profile: No results for input(s): GLUCAP in the last 72 hours. Protein Profile:   Recent Labs Lab 08/09/15 0048  ALBUMIN 3.4*   Nutritional Anemia Profile:  CBC Latest Ref Rng 08/09/2015 08/09/2015 05/17/2015  WBC 3.8 - 10.6 K/uL 11.5(H) 13.5(H) 5.7  Hemoglobin 13.0 - 18.0 g/dL 10.9(L) 12.5(L) 11.0(L)  Hematocrit 40.0 - 52.0 % 33.7(L) 39.1(L) 33.6(L)   Platelets 150 - 440 K/uL 230 278 252    Meds: NS at 75 ml/hr  Height:   Ht Readings from Last 1 Encounters:  08/09/15 5\' 9"  (1.753 m)    Weight: no weight loss as per weight encounters  Wt Readings from Last 1 Encounters:  08/09/15 308 lb 4.8 oz (139.844 kg)   Filed Weights   08/09/15 0103 08/09/15 0449  Weight: 315 lb 14.4 oz (143.291 kg) 308 lb 4.8 oz (139.844 kg)    Wt Readings from Last 10 Encounters:   08/09/15 308 lb 4.8 oz (139.844 kg)   05/14/15 291 lb 0.1 oz (132 kg)   03/22/15 285 lb 11.2 oz (129.593 kg)   02/24/15 292 lb 6.4 oz (132.632 kg)   01/31/15 287 lb (130.182 kg)   01/19/15 290 lb (131.543 kg)   08/18/14 280 lb (127.007 kg)   10/16/13 285 lb (129.275 kg)    LOW Care Level   [facility] MS, RD, LDN [phone] Pager

## 2015-08-09 NOTE — Care Management (Signed)
Patient presents from home with UTI.  Patient is nonverbal, and bedbound.  Patient lives at home with his mother and father.  Mother is still employed, and father stays at home with patient.  Patient receives 116 hours of aide service every 2 weeks through [facility].  Aide comes 7 days a week twice a day.  Patient obtains his medications from [facility] in [place].  Patient has hoyer lift, hospital bed, WC, nebulizer, and CPAP at home.  Patient is transported by father in handicap accessible [facility].  Patient is open to hospice services via [place].  Santiago Glad hospice liaison notified of admission.  Patient is currently on acute O2.  RNCM following for discharge planning

## 2015-08-09 NOTE — Progress Notes (Signed)
Patient's mother requesting SCD's, MD willis aware.

## 2015-08-09 NOTE — Progress Notes (Signed)
Visit made. Patient is currently followed by Hospice and [place] at home with a diagnosis of frontal temporal dementia and is a DNR. He was admitted to [facility] on 11/28 for treatment of a urinary tract infection. He is currently receiving IV antibiotics. Patient seen sitting up in bed, alert, caregiver Malachy Mood at bedside. Patient is nonverbal, he does make sounds and will smile with interaction. Oxygen on via nasal cannula at 2 liters, patient does not use oxygen at home currently. Malachy Mood reports he ate well at lunch. Foley (supra pubic) draining blue tinged urine (medication related). Urine culture pending. Will continue to follow and update hospice team. CMRN Col[place] is aware that patient is followed by hospice. Flo Shanks RN, BSN, [facility] [place], [facility] [phone] c

## 2015-08-09 NOTE — H&P (Addendum)
[facility] [facility] [place]    MR#:  [id number]  DATE OF BIRTH:  Jul 28, 1966  DATE OF ADMISSION:  08/09/2015  PRIMARY CARE PHYSICIAN: Ashok Norris, MD   REQUESTING/REFERRING PHYSICIAN:   CHIEF COMPLAINT:   Chief Complaint  Patient presents with  . Altered Mental Status    HISTORY OF PRESENT ILLNESS: Kent [name]  is a 49 y.o. male with a known history of frontotemporal dementia, hypertension, sleep apnea, neurologic bladder, history of deep venous thrombosis and pulmonary embolism on Coumadin, atrial fibrillation, myelopathy presented to the emergency room because of fever and confusion. Patient's family noticed change in mental status and they were concerned. Patient is on home hospice and DO NOT RESUSCITATE by CODE STATUS. Patient is dependent on activities of daily living. Patient is mostly and completely bedbound. Patient was worked up with the emergency room and was found to have urinary tract infection. Patient is non-verbal and unable to give any history. Most of the information obtained from the patient's parents were at bedside. No history of any fall. No history of any GI bleed.  PAST MEDICAL HISTORY:   Past Medical History  Diagnosis Date  . Frontotemporal dementia   . Sleep apnea   . Hypertension   . Depression   . Neurogenic bladder   . Myelopathy ([place])   . Obesity   . Degeneration, intervertebral disc, cervical   . Elevated liver function tests   . Pulmonary embolus (HCC)     on chronic anticoagulation  . Chronic UTI (urinary tract infection)   . Pernicious anemia   . Rosacea   . BPH (benign prostatic hypertrophy)   . History of DVT (deep vein thrombosis)   . History of pulmonary embolism     currently on coumadin  . Anxiety   . New onset a-fib [facility]) 2013    PAST SURGICAL HISTORY:  Past Surgical History  Procedure Laterality Date  . Suprapubic catheter insertion    . Kidney stone surgery       SOCIAL HISTORY:  Social History  Substance Use Topics  . Smoking status: Never Smoker   . Smokeless tobacco: Not on file  . Alcohol Use: No    FAMILY HISTORY:  Family History  Problem Relation Age of Onset  . Hypertension Father   . Hyperlipidemia Father   . Hypertension Mother     DRUG ALLERGIES:  Allergies  Allergen Reactions  . Codeine Nausea Only  . Piperacillin-Tazobactam In Dex Rash and Other (See Comments)    Arm erythema on administration, but tolerated cephalosporins fine.    . Vancomycin Rash and Other (See Comments)    Reaction:  Red man syndrome   . Zosyn [Piperacillin Sod-Tazobactam So] Rash and Other (See Comments)    Pt experienced arm erythema on administration, but tolerated cephalosporins fine.      REVIEW OF SYSTEMS:  Patient is non verbal unable to give information.  MEDICATIONS AT HOME:  Prior to Admission medications   Medication Sig Start Date End Date Taking? Authorizing Provider  ALPRAZolam (XANAX) 0.25 MG tablet Take 1 tablet (0.25 mg total) by mouth 3 (three) times daily as needed for anxiety. 05/19/15   Fritzi Mandes, MD  benzonatate (TESSALON) 100 MG capsule Take 100 mg by mouth every 8 (eight) hours as needed for cough.    Historical Provider, MD  benzonatate (TESSALON) 100 MG capsule TAKE ONE CAPSULE BY MOUTH EVERY 8 HOURS AS NEEDED 05/26/15  Ashok Norris, MD  citalopram (CELEXA) 20 MG tablet Take 20 mg by mouth 2 (two) times daily.    Historical Provider, MD  clindamycin (CLEOCIN T) 1 % external solution Apply 1 application topically 2 (two) times daily. Pt applies to scalp.    Historical Provider, MD  clotrimazole-betamethasone (LOTRISONE) cream APPLY TO WOUND TWICE A DAY FOR 10 DAYS 07/18/15   Ashok Norris, MD  cyanocobalamin (,VITAMIN B-12,) 1000 MCG/ML injection Inject 500 mcg into the muscle every 30 (thirty) days.    Historical Provider, MD  dextromethorphan (DELSYM) 30 MG/5ML liquid Take 90 mg by mouth 2 (two) times daily as needed  for cough.     Historical Provider, MD  erythromycin ophthalmic ointment INSTILL 1 DROP INTO EACH EYE 3 TIMES A DAY 07/30/15   Ashok Norris, MD  flecainide (TAMBOCOR) 50 MG tablet Take 50 mg by mouth 2 (two) times daily.    Historical Provider, MD  fluticasone (FLONASE) 50 MCG/ACT nasal spray Place 1 spray into both nostrils 2 (two) times daily.    Historical Provider, MD  furosemide (LASIX) 20 MG tablet Take 1 tablet (20 mg total) by mouth daily. 05/20/15   Ashok Norris, MD  gluconic acid-citric acid (RENACIDIN) irrigation Irrigate with 30 mLs as directed 3 (three) times daily.    Historical Provider, MD  guaiFENesin (MUCINEX) 600 MG 12 hr tablet Take 600 mg by mouth 2 (two) times daily.     Historical Provider, MD  HYDROcodone-acetaminophen (NORCO/VICODIN) 5-325 MG per tablet Take 1-2 tablets by mouth every 6 (six) hours as needed for moderate pain.    Historical Provider, MD  ipratropium-albuterol (DUONEB) 0.5-2.5 (3) MG/3ML SOLN Take 3 mLs by nebulization 4 (four) times daily as needed (for shortness of breath).    Historical Provider, MD  ketoconazole (NIZORAL) 2 % shampoo Apply 1 application topically once a week. Pt uses on Friday.    Historical Provider, MD  levofloxacin (LEVAQUIN) 500 MG tablet Take 1 tablet (500 mg total) by mouth daily. 05/19/15   Fritzi Mandes, MD  loratadine (CLARITIN) 10 MG tablet Take 10 mg by mouth at bedtime.     Historical Provider, MD  LORazepam (ATIVAN) 0.5 MG tablet Take 0.5 mg by mouth 2 (two) times daily.    Historical Provider, MD  lovastatin (MEVACOR) 40 MG tablet TAKE 1 TABLET BY MOUTH DAILY 05/26/15   Ashok Norris, MD  Methen-Hyosc-Meth Blue-Na Phos (UROGESIC-BLUE) 81.6 MG TABS Take 1 tablet by mouth 2 (two) times daily.    Historical Provider, MD  metoprolol tartrate (LOPRESSOR) 25 MG tablet Take 12.5 mg by mouth daily at 12 noon.     Historical Provider, MD  nitrofurantoin (MACRODANTIN) 100 MG capsule Take 100 mg by mouth every evening.     Historical  Provider, MD  omeprazole (PRILOSEC) 40 MG capsule TAKE ONE CAPSULE BY MOUTH EVERY DAY 05/31/15   Ashok Norris, MD  oxybutynin (DITROPAN) 5 MG tablet Take 5 mg by mouth 3 (three) times daily.    Historical Provider, MD  polyethylene glycol (MIRALAX / GLYCOLAX) packet Take 17 g by mouth daily.    Historical Provider, MD  QUEtiapine (SEROQUEL) 50 MG tablet Take 50 mg by mouth at bedtime.    Historical Provider, MD  URELLE (URELLE/URISED) 81 MG TABS tablet Take 1 tablet (81 mg total) by mouth 2 (two) times daily. 05/19/15   Fritzi Mandes, MD  warfarin (COUMADIN) 5 MG tablet Take 0.5-1 tablets (2.5-5 mg total) by mouth daily. Pt alternates between 2.5 mg and 5  mg on an every other day basis. 03/24/15   Fritzi Mandes, MD      PHYSICAL EXAMINATION:   VITAL SIGNS: Blood pressure 130/94, pulse 102, temperature 100.4 F (38 C), temperature source Rectal, resp. rate 21, height 5\' 9"  (1.753 m), weight 143.291 kg (315 lb 14.4 oz), SpO2 90 %.  GENERAL:  49 y.o.-year-old patient lying in the bed, awake EYES: Pupils equal, round, reactive to light and accommodation. No scleral icterus. Extraocular muscles intact.  HEENT: Head atraumatic, normocephalic. Oropharynx dry NECK:  Supple, no jugular venous distention. No thyroid enlargement, no tenderness.  LUNGS: Normal breath sounds bilaterally, no wheezing, rales,rhonchi or crepitation. No use of accessory muscles of respiration.  CARDIOVASCULAR: S1, S2 normal. No murmurs, rubs, or gallops.  ABDOMEN: Soft, nontender, nondistended. Bowel sounds present. No organomegaly or mass. Supra pubic catheter noted. EXTREMITIES: No pedal edema, cyanosis, or clubbing.  NEUROLOGIC: Demented and non verbal, unable follow any commands hence complete exam not possible. PSYCHIATRIC: Not oriented to time,place and person. SKIN: No obvious rash, lesion, or ulcer.   LABORATORY PANEL:   CBC  Recent Labs Lab 08/09/15 0048  WBC 13.5*  HGB 12.5*  HCT 39.1*  PLT 278  MCV 86.5   MCH 27.6  MCHC 31.9*  RDW 18.6*  LYMPHSABS 0.1*  MONOABS 0.9  EOSABS 0.0  BASOSABS 0.0   ------------------------------------------------------------------------------------------------------------------  Chemistries   Recent Labs Lab 08/09/15 0048  NA 137  K 4.2  CL 105  CO2 25  GLUCOSE 117*  BUN 15  CREATININE 0.39*  CALCIUM 8.5*  AST 31  ALT 42  ALKPHOS 84  BILITOT 0.7   ------------------------------------------------------------------------------------------------------------------ estimated creatinine clearance is 157.5 mL/min (by C-G formula based on Cr of 0.39). ------------------------------------------------------------------------------------------------------------------ No results for input(s): TSH, T4TOTAL, T3FREE, THYROIDAB in the last 72 hours.  Invalid input(s): FREET3   Coagulation profile  Recent Labs Lab 08/09/15 0048  INR 3.60   ------------------------------------------------------------------------------------------------------------------- No results for input(s): DDIMER in the last 72 hours. -------------------------------------------------------------------------------------------------------------------  Cardiac Enzymes  Recent Labs Lab 08/09/15 0048  TROPONINI <0.03   ------------------------------------------------------------------------------------------------------------------ Invalid input(s): POCBNP  ---------------------------------------------------------------------------------------------------------------  Urinalysis    Component Value Date/Time   COLORURINE BLUE* 08/09/2015 0048   COLORURINE Yellow 01/01/2015 1736   APPEARANCEUR CLOUDY* 08/09/2015 0048   APPEARANCEUR Cloudy 01/01/2015 1736   LABSPEC 1.020 08/09/2015 0048   LABSPEC 1.015 01/01/2015 1736   PHURINE 6.0 08/09/2015 0048   PHURINE 6.0 01/01/2015 1736   GLUCOSEU NEGATIVE 08/09/2015 0048   GLUCOSEU Negative 01/01/2015 1736   HGBUR 1+* 08/09/2015  0048   HGBUR Negative 01/01/2015 1736   BILIRUBINUR NEGATIVE 08/09/2015 0048   BILIRUBINUR Negative 01/01/2015 1736   KETONESUR NEGATIVE 08/09/2015 0048   KETONESUR Negative 01/01/2015 1736   PROTEINUR 30* 08/09/2015 0048   PROTEINUR 30 mg/dL 01/01/2015 1736   NITRITE POSITIVE* 08/09/2015 0048   NITRITE Positive 01/01/2015 1736   LEUKOCYTESUR 3+* 08/09/2015 0048   LEUKOCYTESUR 3+ 01/01/2015 1736     RADIOLOGY: Dg Chest Port 1 View  08/09/2015  CLINICAL DATA:  Bilious vomiting. EXAM: PORTABLE CHEST 1 VIEW COMPARISON:  05/14/2015 FINDINGS: There is unchanged right hemidiaphragm elevation. Left lung is clear. No large effusion or pneumothorax evident. IMPRESSION: Negative limited study Electronically Signed   By: Andreas Newport M.D.   On: 08/09/2015 01:49    EKG: Orders placed or performed during the hospital encounter of 08/09/15  . ED EKG 12-Lead  . ED EKG 12-Lead  . EKG 12-Lead  . EKG 12-Lead    IMPRESSION AND  PLAN: 1. Altered mental status probably secondary to UTI 2. Urinary tract infection 3. Leukocytosis 4. Frontotemporal dementia 5. History of DVT and pulmonary embolism 6. Fever 7.SIRS Management plan 1.Admit patient to telemetry under inpatient service 2. Start patient on IV Levaquin antibiotic for urinary tract infection 3. IV hydration with normal saline 4. INR is supratherapeutic we will hold Coumadin and follow the level 5. Follow-up WBC count 6. Supportive care  All the records are reviewed and case discussed with ED provider. Management plans discussed with the patient, family and they are in agreement.  CODE STATUS: DNR Advance Directive Documentation        Most Recent Value   Type of Advance Directive  Healthcare Power of Attorney   Pre-existing out of facility DNR order (yellow form or pink MOST form)     "MOST" Form in Place?         TOTAL TIME TAKING CARE OF THIS PATIENT:37  minutes.    Saundra Shelling M.D on 08/09/2015 at 2:13  AM  Between 7am to 6pm - Pager - [phone]  After 6pm go to [email or web address] - password [email or web address] [facility]  Office  [phone]  CC: Primary care physician; Ashok Norris, MD

## 2015-08-09 NOTE — ED Notes (Signed)
Report called to floor, given to [place].

## 2015-08-09 NOTE — Progress Notes (Signed)
Patient home medication Renacidin irrigation for catheter is not available in hospital pharmacy, [name] from [facility] stated that they have ordered it and patient would get it tomorrow and that home med can be used until tomorrow. Family stated that they would bring in med from home so patient would not miss all doses today. Patient resting well and took all meds this am with no difficulty in apple sauce. Kent [name]

## 2015-08-09 NOTE — Progress Notes (Signed)
ANTIBIOTIC CONSULT NOTE - INITIAL  Pharmacy Consult for levofloxacin Indication: complicated UTI/urosepsis  Allergies  Allergen Reactions  . Codeine Nausea Only  . Piperacillin-Tazobactam In Dex Rash and Other (See Comments)    Arm erythema on administration, but tolerated cephalosporins fine.    . Vancomycin Rash and Other (See Comments)    Reaction:  Red man syndrome   . Zosyn [Piperacillin Sod-Tazobactam So] Rash and Other (See Comments)    Pt experienced arm erythema on administration, but tolerated cephalosporins fine.      Patient Measurements: Height: 5\' 9"  (175.3 cm) Weight: (!) 315 lb 14.4 oz (143.291 kg) IBW/kg (Calculated) : 70.7 Adjusted Body Weight:   Vital Signs: Temp: 100.4 F (38 C) (11/28 0110) Temp Source: Rectal (11/28 0110) BP: 130/94 mmHg (11/28 0036) Pulse Rate: 102 (11/28 0110) Intake/Output from previous day:   Intake/Output from this shift:    Labs:  Recent Labs  08/09/15 0048  WBC 13.5*  HGB 12.5*  PLT 278  CREATININE 0.39*   Estimated Creatinine Clearance: 157.5 mL/min (by C-G formula based on Cr of 0.39). No results for input(s): VANCOTROUGH, VANCOPEAK, VANCORANDOM, GENTTROUGH, GENTPEAK, GENTRANDOM, TOBRATROUGH, TOBRAPEAK, TOBRARND, AMIKACINPEAK, AMIKACINTROU, AMIKACIN in the last 72 hours.   Microbiology: No results found for this or any previous visit (from the past 720 hour(s)).  Medical History: Past Medical History  Diagnosis Date  . Frontotemporal dementia   . Sleep apnea   . Hypertension   . Depression   . Neurogenic bladder   . Myelopathy ([place])   . Obesity   . Degeneration, intervertebral disc, cervical   . Elevated liver function tests   . Pulmonary embolus (HCC)     on chronic anticoagulation  . Chronic UTI (urinary tract infection)   . Pernicious anemia   . Rosacea   . BPH (benign prostatic hypertrophy)   . History of DVT (deep vein thrombosis)   . History of pulmonary embolism     currently on coumadin  .  Anxiety   . New onset a-fib ([place]) 2013    Medications:  Infusions:  . levofloxacin (LEVAQUIN) IV 750 mg (08/09/15 0155)  . sodium chloride 1,000 mL (08/09/15 0102)   Assessment: 55 yom via EMS for vomiting/acting differently from baseline. Of note patient is followed by Urology who says "he will always be colonized with bacteria... Treatment for a presumed urinary tract infection based on a positive culture without other evidence of an infection is not recommended." He was continued by urology on suppressive nitrofurantoin and bladder irrigations of renacidin.  Urine culture from 05/14/15 showed pseudomonas aeruginosa, widely susceptible, however urine from 02/23/15 was cipro resistant.    In ED temperature 100.4, HR 102, WBC 13.5, LA 1.9.   Goal of Therapy:    Plan:  Levofloxacin 750 mg IV Q24H. Pharmacy will continue to follow and recommend adjustments as needed based on culture/sensitivites.   Laural Benes, Pharm.D., BCPS Clinical Pharmacist 08/09/2015,2:19 AM

## 2015-08-09 NOTE — ED Notes (Signed)
Per [facility] family called hospice and hospice called EMS d/t pt vomiting and acting differently from baseline. Per EMS pt is nonverbal.

## 2015-08-09 NOTE — Progress Notes (Signed)
[place] [facility] [place] NAME: Kent [name]    MR#:  [id number]  DATE OF BIRTH:  1966-06-13  SUBJECTIVE: 49 year old male with temporal frontotemporal dementia admitted for UTI./Altered mental status Nonverbal at baseline due to dementia.   CHIEF COMPLAINT:   Chief Complaint  Patient presents with  . Altered Mental Status    REVIEW OF SYSTEMS:   Review of Systems  Unable to perform ROS: dementia     DRUG ALLERGIES:   Allergies  Allergen Reactions  . Codeine Nausea Only  . Piperacillin-Tazobactam In Dex Rash and Other (See Comments)    Arm erythema on administration, but tolerated cephalosporins fine.    . Vancomycin Rash and Other (See Comments)    Reaction:  Red man syndrome   . Zosyn [Piperacillin Sod-Tazobactam So] Rash and Other (See Comments)    Pt experienced arm erythema on administration, but tolerated cephalosporins fine.      VITALS:  Blood pressure 116/71, pulse 86, temperature 97.9 F (36.6 C), temperature source Oral, resp. rate 22, height 5\' 9"  (1.753 m), weight 139.844 kg (308 lb 4.8 oz), SpO2 93 %.  PHYSICAL EXAMINATION:  GENERAL:  49 y.o.-year-old patient lying in the bed with no acute distress.  EYES: Pupils equal, round, reactive to light and accommodation. No scleral icterus. Extraocular muscles intact.  HEENT: Head atraumatic, normocephalic. Oropharynx and nasopharynx clear.  NECK:  Supple, no jugular venous distention. No thyroid enlargement, no tenderness.  LUNGS: Normal breath sounds bilaterally, no wheezing, rales,rhonchi or crepitation. No use of accessory muscles of respiration.  CARDIOVASCULAR: S1, S2 normal. No murmurs, rubs, or gallops.  ABDOMEN: Soft, nontender, nondistended. Bowel sounds present. No organomegaly or mass.  EXTREMITIES: No pedal edema, cyanosis, or clubbing.  NEUROLOGIC: Cranial nerves II through XII are intact. Muscle strength 5/5 in all extremities. Sensation intact. Gait not  checked.  PSYCHIATRIC: dementia not able to communicate  SKIN: No obvious rash, lesion, or ulcer.    LABORATORY PANEL:   CBC  Recent Labs Lab 08/09/15 0529  WBC 11.5*  HGB 10.9*  HCT 33.7*  PLT 230   ------------------------------------------------------------------------------------------------------------------  Chemistries   Recent Labs Lab 08/09/15 0048 08/09/15 0529  NA 137 138  K 4.2 3.7  CL 105 108  CO2 25 25  GLUCOSE 117* 126*  BUN 15 13  CREATININE 0.39* <0.30*  CALCIUM 8.5* 7.4*  AST 31  --   ALT 42  --   ALKPHOS 84  --   BILITOT 0.7  --    ------------------------------------------------------------------------------------------------------------------  Cardiac Enzymes  Recent Labs Lab 08/09/15 0048  TROPONINI <0.03   ------------------------------------------------------------------------------------------------------------------  RADIOLOGY:  Dg Chest Port 1 View  08/09/2015  CLINICAL DATA:  Bilious vomiting. EXAM: PORTABLE CHEST 1 VIEW COMPARISON:  05/14/2015 FINDINGS: There is unchanged right hemidiaphragm elevation. Left lung is clear. No large effusion or pneumothorax evident. IMPRESSION: Negative limited study Electronically Signed   By: Andreas Newport M.D.   On: 08/09/2015 01:49    EKG:   Orders placed or performed during the hospital encounter of 08/09/15  . ED EKG 12-Lead  . ED EKG 12-Lead  . EKG 12-Lead  . EKG 12-Lead    ASSESSMENT AND PLAN:   #1 altered mental status secondary to UTI: Continue Levaquin, follow culture data, clinical course. #2 sepsis present on admission UTI: Continue Levaquin. Will hold the Lasix, continue IV hydration. #3. H/o PE;supratheraupticINR on admission,monitor and restart coumadin,INR was 3.6 on  Admission, 4.Depression and anxiety continue home  medications.  #5 new onset atrial fibrillation: Continue flecainide, Coumadin.  6 frontotemporal dementia, bedridden status DO NOT RESUSCITATE. #7  neurogenic bladder gets suprapubic catheter changed every month follows up with Dr. Jacqlyn Larsen D/w father   All the records are reviewed and case discussed with Care Management/Social Workerr. Management plans discussed with the patient, family and they are in agreement.  CODE STATUS:DNR  TOTAL TIME TAKING CARE OF THIS PATIENT: 54minutes.   POSSIBLE D/C IN 1-2DAYS, DEPENDING ON CLINICAL CONDITION.   Epifanio Lesches M.D on 08/09/2015 at 1:08 PM  Between 7am to 6pm - Pager - [phone]  After 6pm go to [email or web address] - password [email or web address] [facility]  Office  [phone]  CC: Primary care physician; Ashok Norris, MD   Note: This dictation was prepared with Dragon dictation along with smaller phrase technology. Any transcriptional errors that result from this process are unintentional.

## 2015-08-09 NOTE — Progress Notes (Signed)
PT=43.1 and INR= 4.73. Dr. Estanislado Pandy notified and he indicated he has already held patient's Coumadin. Will continue to monitor.

## 2015-08-09 NOTE — Progress Notes (Signed)
ANTICOAGULATION CONSULT NOTE - Initial Consult  Pharmacy Consult for warfarin Indication: VTE prophylaxis/atrial fibrillation  Allergies  Allergen Reactions  . Codeine Nausea Only  . Piperacillin-Tazobactam In Dex Rash and Other (See Comments)    Arm erythema on administration, but tolerated cephalosporins fine.    . Vancomycin Rash and Other (See Comments)    Reaction:  Red man syndrome   . Zosyn [Piperacillin Sod-Tazobactam So] Rash and Other (See Comments)    Pt experienced arm erythema on administration, but tolerated cephalosporins fine.      Patient Measurements: Height: 5\' 9"  (175.3 cm) Weight: (!) 308 lb 4.8 oz (139.844 kg) IBW/kg (Calculated) : 70.7  Vital Signs: Temp: 97.9 F (36.6 C) (11/28 1114) Temp Source: Oral (11/28 1114) BP: 116/71 mmHg (11/28 1114) Pulse Rate: 86 (11/28 1114)  Labs:  Recent Labs  08/09/15 0048 08/09/15 0529  HGB 12.5* 10.9*  HCT 39.1* 33.7*  PLT 278 230  LABPROT 35.1* 43.1*  INR 3.60 4.73*  CREATININE 0.39* <0.30*  TROPONINI <0.03  --     CrCl cannot be calculated (Patient has no serum creatinine result on file.).   Medical History: Past Medical History  Diagnosis Date  . Frontotemporal dementia   . Sleep apnea   . Hypertension   . Depression   . Neurogenic bladder   . Myelopathy ([place])   . Obesity   . Degeneration, intervertebral disc, cervical   . Elevated liver function tests   . Pulmonary embolus (HCC)     on chronic anticoagulation  . Chronic UTI (urinary tract infection)   . Pernicious anemia   . Rosacea   . BPH (benign prostatic hypertrophy)   . History of DVT (deep vein thrombosis)   . History of pulmonary embolism     currently on coumadin  . Anxiety   . New onset a-fib [facility]) 2013   Assessment: Pharmacy consulted to dose warfarin in this 49 year old male admitted with altered mental status. Patient was taking warfarin prior to admission for a history of DVT/PE as well as newly diagnosed atrial  fibrillation. The patient's home regimen was warfarin 5 mg PO daily.  Patients INR on admission is supratherapeutic at 4.73  Goal of Therapy:  INR 2-3 Monitor platelets by anticoagulation protocol: Yes   Plan:  HOLD warfarin today due to elevated INR INR and CBC ordered with AM labs tomorrow  Pharmacy will monitor and resume warfarin dosing once INR is therapeutic. Thank you for the consult.  Darylene Price Tollie Canada 08/09/2015,2:25 PM

## 2015-08-09 NOTE — Progress Notes (Signed)
Patient admitted to room 238 with a diagnosis of fever. Alert to self. Fall contract signed by his father. Patient is non-verbal. Skin assessment done with Cristopher Peru., no skin issues of concern noted. Oriented the father to the room staff and call bell/ascom. No acute distress noted. Will continue to monitor.

## 2015-08-10 LAB — CBC WITH DIFFERENTIAL/PLATELET
BASOS ABS: 0 10*3/uL (ref 0–0.1)
Basophils Relative: 1 %
EOS ABS: 0 10*3/uL (ref 0–0.7)
EOS PCT: 0 %
HCT: 32.1 % — ABNORMAL LOW (ref 40.0–52.0)
Hemoglobin: 10.6 g/dL — ABNORMAL LOW (ref 13.0–18.0)
LYMPHS PCT: 10 %
Lymphs Abs: 0.8 10*3/uL — ABNORMAL LOW (ref 1.0–3.6)
MCH: 28.7 pg (ref 26.0–34.0)
MCHC: 33.1 g/dL (ref 32.0–36.0)
MCV: 86.7 fL (ref 80.0–100.0)
MONO ABS: 1 10*3/uL (ref 0.2–1.0)
Monocytes Relative: 12 %
Neutro Abs: 6.8 10*3/uL — ABNORMAL HIGH (ref 1.4–6.5)
Neutrophils Relative %: 77 %
PLATELETS: 226 10*3/uL (ref 150–440)
RBC: 3.71 MIL/uL — ABNORMAL LOW (ref 4.40–5.90)
RDW: 18.2 % — AB (ref 11.5–14.5)
WBC: 8.6 10*3/uL (ref 3.8–10.6)

## 2015-08-10 LAB — PROTIME-INR
INR: 2.95
Prothrombin Time: 30.2 seconds — ABNORMAL HIGH (ref 11.4–15.0)

## 2015-08-10 MED ORDER — FUROSEMIDE 10 MG/ML IJ SOLN
20.0000 mg | Freq: Once | INTRAMUSCULAR | Status: AC
  Administered 2015-08-10: 20 mg via INTRAVENOUS
  Filled 2015-08-10: qty 2

## 2015-08-10 MED ORDER — WARFARIN - PHARMACIST DOSING INPATIENT
Freq: Every day | Status: DC
  Administered 2015-08-10 – 2015-08-14 (×5)

## 2015-08-10 MED ORDER — ACETAMINOPHEN 650 MG RE SUPP
650.0000 mg | Freq: Four times a day (QID) | RECTAL | Status: DC | PRN
  Administered 2015-08-13 – 2015-08-16 (×3): 650 mg via RECTAL
  Filled 2015-08-10 (×4): qty 1

## 2015-08-10 MED ORDER — WARFARIN SODIUM 5 MG PO TABS
2.5000 mg | ORAL_TABLET | Freq: Once | ORAL | Status: AC
  Administered 2015-08-10: 2.5 mg via ORAL
  Filled 2015-08-10: qty 1

## 2015-08-10 MED ORDER — ACETAMINOPHEN 325 MG PO TABS
650.0000 mg | ORAL_TABLET | Freq: Four times a day (QID) | ORAL | Status: DC | PRN
  Administered 2015-08-10 – 2015-08-12 (×4): 650 mg via ORAL
  Filled 2015-08-10 (×4): qty 2

## 2015-08-10 NOTE — Progress Notes (Signed)
Patient temperature elevated rectally, 101.3. On call prime doctor, Dr. Darvin Neighbours, gave telephone order for tylenol. Wilnette Kales

## 2015-08-10 NOTE — Progress Notes (Signed)
[place] [facility] [place] NAME: Kent [name]    MR#:  [id number]  DATE OF BIRTH:  Feb 04, 1966  SUBJECTIVE: 49 year old male with temporal frontotemporal dementia admitted for UTI./Altered mental status Nonverbal at baseline due to dementia. Low-grade temperature today morning at 99.6.  f  CHIEF COMPLAINT:   Chief Complaint  Patient presents with  . Altered Mental Status    REVIEW OF SYSTEMS:   Review of Systems  Unable to perform ROS: dementia     DRUG ALLERGIES:   Allergies  Allergen Reactions  . Codeine Nausea Only  . Piperacillin-Tazobactam In Dex Rash and Other (See Comments)    Arm erythema on administration, but tolerated cephalosporins fine.    . Vancomycin Rash and Other (See Comments)    Reaction:  Red man syndrome   . Zosyn [Piperacillin Sod-Tazobactam So] Rash and Other (See Comments)    Pt experienced arm erythema on administration, but tolerated cephalosporins fine.      VITALS:  Blood pressure 139/71, pulse 86, temperature 99.6 F (37.6 C), temperature source Oral, resp. rate 18, height 5\' 9"  (1.753 m), weight 142.293 kg (313 lb 11.2 oz), SpO2 94 %.  PHYSICAL EXAMINATION:  GENERAL:  49 y.o.-year-old patient lying in the bed with no acute distress.  EYES: Pupils equal, round, reactive to light and accommodation. No scleral icterus. Extraocular muscles intact.  HEENT: Head atraumatic, normocephalic. Oropharynx and nasopharynx clear.  NECK:  Supple, no jugular venous distention. No thyroid enlargement, no tenderness.  LUNGS: Normal breath sounds bilaterally, no wheezing, rales,rhonchi or crepitation. No use of accessory muscles of respiration.  CARDIOVASCULAR: S1, S2 normal. No murmurs, rubs, or gallops.  ABDOMEN: Soft, nontender, nondistended. Bowel sounds present. No organomegaly or mass.  EXTREMITIES: No pedal edema, cyanosis, or clubbing.  NEUROLOGIC: Cranial nerves II through XII are intact. Muscle strength 5/5  in all extremities. Sensation intact. Gait not checked.  PSYCHIATRIC: dementia not able to communicate  SKIN: No obvious rash, lesion, or ulcer.    LABORATORY PANEL:   CBC  Recent Labs Lab 08/10/15 0422  WBC 8.6  HGB 10.6*  HCT 32.1*  PLT 226   ------------------------------------------------------------------------------------------------------------------  Chemistries   Recent Labs Lab 08/09/15 0048 08/09/15 0529  NA 137 138  K 4.2 3.7  CL 105 108  CO2 25 25  GLUCOSE 117* 126*  BUN 15 13  CREATININE 0.39* <0.30*  CALCIUM 8.5* 7.4*  AST 31  --   ALT 42  --   ALKPHOS 84  --   BILITOT 0.7  --    ------------------------------------------------------------------------------------------------------------------  Cardiac Enzymes  Recent Labs Lab 08/09/15 0048  TROPONINI <0.03   ------------------------------------------------------------------------------------------------------------------  RADIOLOGY:  Dg Chest Port 1 View  08/09/2015  CLINICAL DATA:  Bilious vomiting. EXAM: PORTABLE CHEST 1 VIEW COMPARISON:  05/14/2015 FINDINGS: There is unchanged right hemidiaphragm elevation. Left lung is clear. No large effusion or pneumothorax evident. IMPRESSION: Negative limited study Electronically Signed   By: Andreas Newport M.D.   On: 08/09/2015 01:49    EKG:   Orders placed or performed during the hospital encounter of 08/09/15  . ED EKG 12-Lead  . ED EKG 12-Lead  . EKG 12-Lead  . EKG 12-Lead    ASSESSMENT AND PLAN:   #1 altered mental status secondary to UTI: Continue Levaquin, follow culture data, clinical course. #2 sepsis present on admission UTI: Continue Levaquin. Still with low-grade temperature,  Follow urine culture data and  adjust antibiotics., WBC is down. #3. H/o  PE;supratheraupticINR on admission,monitor and restart coumadin,INR was 3.6 on  Admission, INR 2.95 today. Pharmacy to dose Coumadin. 4.Depression and anxiety continue home  medications.  #5 new onset atrial fibrillation: Continue flecainide, Coumadin.  6 frontotemporal dementia, bedridden status DO NOT RESUSCITATE. #7 neurogenic bladder gets suprapubic catheter changed every month follows up with Dr. Cope/ D/w father Discontinue IV fluids in because of edema.  All the records are reviewed and case discussed with Care Management/Social Workerr. Management plans discussed with the patient, family and they are in agreement.  CODE STATUS:DNR  TOTAL TIME TAKING CARE OF THIS PATIENT: 95minutes.   POSSIBLE D/C IN 1-2DAYS, DEPENDING ON CLINICAL CONDITION.   Epifanio Lesches M.D on 08/10/2015 at 9:17 AM  Between 7am to 6pm - Pager - [phone]  After 6pm go to [email or web address] - password [email or web address] [facility]  Office  [phone]  CC: Primary care physician; Ashok Norris, MD   Note: This dictation was prepared with Dragon dictation along with smaller phrase technology. Any transcriptional errors that result from this process are unintentional.

## 2015-08-10 NOTE — Progress Notes (Signed)
Patient oxygen saturation 87% when checked this am, placed on 3L. Oxygen saturation on 3L 91%. Patient's fluids recently stopped, Dr. Vianne Bulls notified and 20 mg IV Lasix ordered as one time dose. Kent [name]

## 2015-08-10 NOTE — Progress Notes (Signed)
Visit made. Patient seen sitting up in bed, very tearful. Paid Caregiver Malachy Mood present during visit, she reports patient has been tearful and "crying out loud" since his parents left earlier. She reports that his parents have spoken to Dr.l Fiji prior to leaving. Per chart note review urine culture is still pending, he continues on IV antibiotics and home medications. Patient was being fed lunch by Malachy Mood at end of visit. Per conversation with staff RN Velna Hatchet, d/t patient's size/weight an specialty mattress has been ordered. Will continue to follow and update hospice team. Flo Shanks RN, BSN, [place] [facility] [place], [facility] [phone] c

## 2015-08-10 NOTE — Progress Notes (Signed)
Verbal order from Dr. Vianne Bulls for air mattress replacement to prevent pressure ulcers. Size wise called and mattress and bed ordered, 3-4 hours is the ETA. Kent [name]

## 2015-08-10 NOTE — Progress Notes (Signed)
ANTICOAGULATION CONSULT NOTE - Initial Consult  Pharmacy Consult for warfarin Indication: VTE prophylaxis/atrial fibrillation  Allergies  Allergen Reactions  . Codeine Nausea Only  . Piperacillin-Tazobactam In Dex Rash and Other (See Comments)    Arm erythema on administration, but tolerated cephalosporins fine.    . Vancomycin Rash and Other (See Comments)    Reaction:  Red man syndrome   . Zosyn [Piperacillin Sod-Tazobactam So] Rash and Other (See Comments)    Pt experienced arm erythema on administration, but tolerated cephalosporins fine.      Patient Measurements: Height: 5\' 9"  (175.3 cm) Weight: (!) 313 lb 11.2 oz (142.293 kg) IBW/kg (Calculated) : 70.7  Vital Signs: Temp: 99.6 F (37.6 C) (11/29 0455) Temp Source: Oral (11/29 0455) BP: 139/71 mmHg (11/29 0455) Pulse Rate: 86 (11/29 0455)  Labs:  Recent Labs  08/09/15 0048 08/09/15 0529 08/10/15 0422  HGB 12.5* 10.9* 10.6*  HCT 39.1* 33.7* 32.1*  PLT 278 230 226  LABPROT 35.1* 43.1* 30.2*  INR 3.60 4.73* 2.95  CREATININE 0.39* <0.30*  --   TROPONINI <0.03  --   --     CrCl cannot be calculated (Patient has no serum creatinine result on file.).   Medical History: Past Medical History  Diagnosis Date  . Frontotemporal dementia   . Sleep apnea   . Hypertension   . Depression   . Neurogenic bladder   . Myelopathy ([place])   . Obesity   . Degeneration, intervertebral disc, cervical   . Elevated liver function tests   . Pulmonary embolus (HCC)     on chronic anticoagulation  . Chronic UTI (urinary tract infection)   . Pernicious anemia   . Rosacea   . BPH (benign prostatic hypertrophy)   . History of DVT (deep vein thrombosis)   . History of pulmonary embolism     currently on coumadin  . Anxiety   . New onset a-fib [facility]) 2013   Assessment: Pharmacy consulted to dose warfarin in this 49 year old male admitted with altered mental status. Patient was taking warfarin prior to admission for a history  of DVT/PE as well as newly diagnosed atrial fibrillation. The patient's home regimen was warfarin 5 mg PO daily.  Patients INR on admission was supratherapeutic at 4.73. Patient did not receive vitamin K.  Patients INR today is therapeutic at 2.95.  Dosing history: Date INR Dose 11/28 4.73 HELD  Goal of Therapy:  INR 2-3 Monitor platelets by anticoagulation protocol: Yes   Plan:  Will give a reduced dose of warfarin 2.5 mg this evening Patient's INR has decreased significantly from yesterday and is at the upper end of therapeutic range today. No significant new DDI so will resume dosing warfarin at a lower dose. INR ordered with AM labs tomorrow  Pharmacy will continue to monitor.  Kent [name] Kent [name] 08/10/2015,8:08 AM

## 2015-08-10 NOTE — Progress Notes (Signed)
Patient's father requesting a c-pap, states that patient wears a c-pap at home all the time, MD willis aware.

## 2015-08-11 ENCOUNTER — Inpatient Hospital Stay

## 2015-08-11 LAB — URINE CULTURE
Culture: 30000
SPECIAL REQUESTS: NORMAL

## 2015-08-11 LAB — PROTIME-INR
INR: 1.79
Prothrombin Time: 20.8 seconds — ABNORMAL HIGH (ref 11.4–15.0)

## 2015-08-11 MED ORDER — SODIUM CHLORIDE 0.9 % IV SOLN
1.0000 g | Freq: Three times a day (TID) | INTRAVENOUS | Status: DC
  Administered 2015-08-11 – 2015-08-16 (×15): 1 g via INTRAVENOUS
  Filled 2015-08-11 (×18): qty 1

## 2015-08-11 MED ORDER — WARFARIN SODIUM 5 MG PO TABS
5.0000 mg | ORAL_TABLET | Freq: Once | ORAL | Status: AC
  Administered 2015-08-11: 5 mg via ORAL
  Filled 2015-08-11: qty 1

## 2015-08-11 NOTE — Progress Notes (Signed)
[place] [facility] [place] NAME: Kent [name]    MR#:  [id number]  DATE OF BIRTH:  10-24-1965  SUBJECTIVE: 49 year old male with temporal frontotemporal dementia admitted for UTI./Altered mental status.having fever/cough.  CHIEF COMPLAINT:   Chief Complaint  Patient presents with  . Altered Mental Status    REVIEW OF SYSTEMS:   Review of Systems  Unable to perform ROS: dementia     DRUG ALLERGIES:   Allergies  Allergen Reactions  . Codeine Nausea Only  . Piperacillin-Tazobactam In Dex Rash and Other (See Comments)    Arm erythema on administration, but tolerated cephalosporins fine.    . Vancomycin Rash and Other (See Comments)    Reaction:  Red man syndrome   . Zosyn [Piperacillin Sod-Tazobactam So] Rash and Other (See Comments)    Pt experienced arm erythema on administration, but tolerated cephalosporins fine.      VITALS:  Blood pressure 115/62, pulse 74, temperature 98 F (36.7 C), temperature source Axillary, resp. rate 18, height 5\' 9"  (1.753 m), weight 142.293 kg (313 lb 11.2 oz), SpO2 91 %.  PHYSICAL EXAMINATION:  GENERAL:  49 y.o.-year-old patient lying in the bed with no acute distress.  EYES: Pupils equal, round, reactive to light and accommodation. No scleral icterus. Extraocular muscles intact.  HEENT: Head atraumatic, normocephalic. Oropharynx and nasopharynx clear.  NECK:  Supple, no jugular venous distention. No thyroid enlargement, no tenderness.  LUNGS: Normal breath sounds bilaterally, no wheezing, rales,rhonchi or crepitation. No use of accessory muscles of respiration.  CARDIOVASCULAR: S1, S2 normal. No murmurs, rubs, or gallops.  ABDOMEN: Soft, nontender, nondistended. Bowel sounds present. No organomegaly or mass.  EXTREMITIES: No pedal edema, cyanosis, or clubbing.  NEUROLOGIC: Cranial nerves II through XII are intact. Muscle strength 5/5 in all extremities. Sensation intact. Gait not checked.   PSYCHIATRIC: dementia not able to communicate  SKIN: No obvious rash, lesion, or ulcer.    LABORATORY PANEL:   CBC  Recent Labs Lab 08/10/15 0422  WBC 8.6  HGB 10.6*  HCT 32.1*  PLT 226   ------------------------------------------------------------------------------------------------------------------  Chemistries   Recent Labs Lab 08/09/15 0048 08/09/15 0529  NA 137 138  K 4.2 3.7  CL 105 108  CO2 25 25  GLUCOSE 117* 126*  BUN 15 13  CREATININE 0.39* <0.30*  CALCIUM 8.5* 7.4*  AST 31  --   ALT 42  --   ALKPHOS 84  --   BILITOT 0.7  --    ------------------------------------------------------------------------------------------------------------------  Cardiac Enzymes  Recent Labs Lab 08/09/15 0048  TROPONINI <0.03   ------------------------------------------------------------------------------------------------------------------  RADIOLOGY:  Dg Chest 1 View  08/11/2015  CLINICAL DATA:  Shortness of breath cough congestion altered mental status slurred speech EXAM: CHEST 1 VIEW COMPARISON:  08/09/2015 FINDINGS: Moderate cardiac enlargement stable. Left lung is clear. On the right, there is extensive opacity over the entire right lung with more dense opacity in the mid to lower lung zone and hazy opacity over the upper lobe. IMPRESSION: Limited inspiratory effect with developing opacity over the right lung. This could represent a combination of consolidation and possibly pleural fluid. Electronically Signed   By: Skipper Cliche M.D.   On: 08/11/2015 11:57    EKG:   Orders placed or performed during the hospital encounter of 08/09/15  . ED EKG 12-Lead  . ED EKG 12-Lead  . EKG 12-Lead  . EKG 12-Lead    ASSESSMENT AND PLAN:   #1 Altered mental status secondary to UTI:  pseudomonas uti;  Start  Imipenam;check xray due to persistent fever. #2 sepsis present on admission UTI: urine culture showed Pseudomonas30,000 colonies.. Possible aspiration pneumonia  chest x-ray today. Continue aspiration precautions.  #3. H/o PE;supratheraupticINR on admission,monitor and restart coumadin,INR was 3.6 on  Admission, INR 2.95 today. Pharmacy to dose Coumadin.  4.Depression and anxiety continue home medications.  #5 new onset atrial fibrillation: Continue flecainide, Coumadin.  6. frontotemporal dementia, bedridden status DO NOT RESUSCITATE.  #7 neurogenic bladder gets suprapubic catheter changed every month follows up with Dr. Jacqlyn Larsen   D/w father      All the records are reviewed and case discussed with Care Management/Social Workerr. Management plans discussed with the patient, family and they are in agreement.  CODE STATUS:DNR  TOTAL TIME TAKING CARE OF THIS PATIENT: 57minutes.   POSSIBLE D/C IN 1-2DAYS, DEPENDING ON CLINICAL CONDITION.   Epifanio Lesches M.D on 08/11/2015 at 12:32 PM  Between 7am to 6pm - Pager - [phone]  After 6pm go to [email or web address] - password [email or web address] [facility]  Office  [phone]  CC: Primary care physician; Ashok Norris, MD   Note: This dictation was prepared with Dragon dictation along with smaller phrase technology. Any transcriptional errors that result from this process are unintentional.

## 2015-08-11 NOTE — Progress Notes (Signed)
ANTICOAGULATION CONSULT NOTE - Initial Consult  Pharmacy Consult for warfarin Indication: VTE prophylaxis/atrial fibrillation  Allergies  Allergen Reactions  . Codeine Nausea Only  . Piperacillin-Tazobactam In Dex Rash and Other (See Comments)    Arm erythema on administration, but tolerated cephalosporins fine.    . Vancomycin Rash and Other (See Comments)    Reaction:  Red man syndrome   . Zosyn [Piperacillin Sod-Tazobactam So] Rash and Other (See Comments)    Pt experienced arm erythema on administration, but tolerated cephalosporins fine.      Patient Measurements: Height: 5\' 9"  (175.3 cm) Weight: (!) 313 lb 11.2 oz (142.293 kg) IBW/kg (Calculated) : 70.7  Vital Signs: Temp: 98.8 F (37.1 C) (11/30 0914) Temp Source: Axillary (11/30 0914) BP: 122/69 mmHg (11/30 0914) Pulse Rate: 78 (11/30 0914)  Labs:  Recent Labs  08/09/15 0048 08/09/15 0529 08/10/15 0422 08/11/15 0405  HGB 12.5* 10.9* 10.6*  --   HCT 39.1* 33.7* 32.1*  --   PLT 278 230 226  --   LABPROT 35.1* 43.1* 30.2* 20.8*  INR 3.60 4.73* 2.95 1.79  CREATININE 0.39* <0.30*  --   --   TROPONINI <0.03  --   --   --     CrCl cannot be calculated (Patient has no serum creatinine result on file.).   Medical History: Past Medical History  Diagnosis Date  . Frontotemporal dementia   . Sleep apnea   . Hypertension   . Depression   . Neurogenic bladder   . Myelopathy ([place])   . Obesity   . Degeneration, intervertebral disc, cervical   . Elevated liver function tests   . Pulmonary embolus (HCC)     on chronic anticoagulation  . Chronic UTI (urinary tract infection)   . Pernicious anemia   . Rosacea   . BPH (benign prostatic hypertrophy)   . History of DVT (deep vein thrombosis)   . History of pulmonary embolism     currently on coumadin  . Anxiety   . New onset a-fib [facility]) 2013   Assessment: Pharmacy consulted to dose warfarin in this 49 year old male admitted with altered mental status. Patient  was taking warfarin prior to admission for a history of DVT/PE as well as newly diagnosed atrial fibrillation. The patient's home regimen was warfarin 5 mg PO daily.  Patients INR on admission was supratherapeutic at 4.73. Patient did not receive vitamin K.  Patients INR today is subtherapeutic at 1.79  Dosing history: Date INR Dose 11/28 4.73 HELD 11/29 2.95 2.5 mg  Goal of Therapy:  INR 2-3 Monitor platelets by anticoagulation protocol: Yes   Plan:  Will give a dose of warfarin 5 mg today Patient's INR has decreased significantly from yesterday, so will increase warfarin dose today and give patient's normal home dose of 5 mg.   INR ordered with AM labs tomorrow  Pharmacy will continue to monitor.  Darylene Price Jniya Madara 08/11/2015,9:31 AM

## 2015-08-11 NOTE — Progress Notes (Signed)
ANTIBIOTIC CONSULT NOTE - INITIAL  Pharmacy Consult for meropenem  Indication: rule out sepsis  Allergies  Allergen Reactions  . Codeine Nausea Only  . Piperacillin-Tazobactam In Dex Rash and Other (See Comments)    Arm erythema on administration, but tolerated cephalosporins fine.    . Vancomycin Rash and Other (See Comments)    Reaction:  Red man syndrome   . Zosyn [Piperacillin Sod-Tazobactam So] Rash and Other (See Comments)    Pt experienced arm erythema on administration, but tolerated cephalosporins fine.      Patient Measurements: Height: 5\' 9"  (175.3 cm) Weight: (!) 313 lb 11.2 oz (142.293 kg) IBW/kg (Calculated) : 70.7 Adjusted Body Weight: 98 kg  Vital Signs: Temp: 98.8 F (37.1 C) (11/30 0914) Temp Source: Axillary (11/30 0914) BP: 122/69 mmHg (11/30 0914) Pulse Rate: 78 (11/30 0914) Intake/Output from previous day: 11/29 0701 - 11/30 0700 In: 580 [P.O.:480; IV Piggyback:100] Out: 2460 [Urine:2460] Intake/Output from this shift: Total I/O In: 240 [P.O.:240] Out: -   Labs:  Recent Labs  08/09/15 0048 08/09/15 0529 08/10/15 0422  WBC 13.5* 11.5* 8.6  HGB 12.5* 10.9* 10.6*  PLT 278 230 226  CREATININE 0.39* <0.30*  --    CrCl cannot be calculated (Patient has no serum creatinine result on file.). No results for input(s): VANCOTROUGH, VANCOPEAK, VANCORANDOM, GENTTROUGH, GENTPEAK, GENTRANDOM, TOBRATROUGH, TOBRAPEAK, TOBRARND, AMIKACINPEAK, AMIKACINTROU, AMIKACIN in the last 72 hours.   Microbiology: Recent Results (from the past 720 hour(s))  Urine culture     Status: None   Collection Time: 08/09/15 12:48 AM  Result Value Ref Range Status   Specimen Description URINE, RANDOM  Final   Special Requests Normal  Final   Culture 30,000 COLONIES/mL PSEUDOMONAS AERUGINOSA  Final   Report Status 08/11/2015 FINAL  Final   Organism ID, Bacteria PSEUDOMONAS AERUGINOSA  Final      Susceptibility   Pseudomonas aeruginosa - MIC*    CEFTAZIDIME 2 SENSITIVE  Sensitive     CIPROFLOXACIN 2 INTERMEDIATE Intermediate     GENTAMICIN 4 SENSITIVE Sensitive     IMIPENEM 2 SENSITIVE Sensitive     PIP/TAZO Value in next row Sensitive      SENSITIVE16    CEFEPIME Value in next row Sensitive      SENSITIVE8    LEVOFLOXACIN Value in next row Intermediate      INTERMEDIATE4    * 30,000 COLONIES/mL PSEUDOMONAS AERUGINOSA  Blood Culture (routine x 2)     Status: None (Preliminary result)   Collection Time: 08/09/15 12:56 AM  Result Value Ref Range Status   Specimen Description BLOOD LEFT ANTECUBITAL  Final   Special Requests BOTTLES DRAWN AEROBIC AND ANAEROBIC 6CC  Final   Culture NO GROWTH 2 DAYS  Final   Report Status PENDING  Incomplete  Blood Culture (routine x 2)     Status: None (Preliminary result)   Collection Time: 08/09/15 12:56 AM  Result Value Ref Range Status   Specimen Description BLOOD RIGHT WRIST  Final   Special Requests BOTTLES DRAWN AEROBIC AND ANAEROBIC 4CC  Final   Culture NO GROWTH 2 DAYS  Final   Report Status PENDING  Incomplete    Medical History: Past Medical History  Diagnosis Date  . Frontotemporal dementia   . Sleep apnea   . Hypertension   . Depression   . Neurogenic bladder   . Myelopathy ([place])   . Obesity   . Degeneration, intervertebral disc, cervical   . Elevated liver function tests   . Pulmonary  embolus ([place])     on chronic anticoagulation  . Chronic UTI (urinary tract infection)   . Pernicious anemia   . Rosacea   . BPH (benign prostatic hypertrophy)   . History of DVT (deep vein thrombosis)   . History of pulmonary embolism     currently on coumadin  . Anxiety   . New onset a-fib [facility]) 2013    Medications:  Anti-infectives    Start     Dose/Rate Route Frequency Ordered Stop   08/11/15 1000  meropenem (MERREM) 1 g in sodium chloride 0.9 % 100 mL IVPB     1 g 200 mL/hr over 30 Minutes Intravenous 3 times per day 08/11/15 0914     08/09/15 2200  levofloxacin (LEVAQUIN) IVPB 750 mg  Status:   Discontinued     750 mg 100 mL/hr over 90 Minutes Intravenous Every 24 hours 08/09/15 0451 08/09/15 1421   08/09/15 1700  cefTRIAXone (ROCEPHIN) 1 g in dextrose 5 % 50 mL IVPB  Status:  Discontinued     1 g 100 mL/hr over 30 Minutes Intravenous Every 24 hours 08/09/15 1422 08/11/15 0815   08/09/15 0145  levofloxacin (LEVAQUIN) IVPB 750 mg     750 mg 100 mL/hr over 90 Minutes Intravenous  Once 08/09/15 0131 08/09/15 0424     Assessment: Pharmacy consulted to dose meropenem in this 49 year old male who was admitted for sepsis/UTI empirically being treated with ceftriaxone 1g daily. Patient has received 2 days of ceftriaxone and continues to spike fevers with a temperature of 101.3 overnight. Antibiotics being broadened for possible pneumonia as well.   Patient is bedbound with low muscle mass so serum creatinine is low at 0.3 and is likely not an accurate representation of renal function with CrockG equation. No renal issues so will dose as if CrCl ~90 mL/min.  Plan:  Follow up culture results  Start meropenem 1g IV q8h and follow culture results  Pharmacy will continue to monitor, thank you for the consult.  Kent [name] 08/11/2015,9:17 AM

## 2015-08-11 NOTE — Progress Notes (Signed)
Visit made. Patient seen lying in bed, now on a specialty mattress d/t his weight. He was alert and appeared much more content than at last visit. His mother Joaquim Lai present during visit. Per chart note review a repeat chest xray was performed d/t fever and cough. IV antibiotics have been changed to treat possible pneumonia, urine culture was positive for pseudomonas. Appetite remains good. Will continue to follow and update hospice team.  Flo Shanks RN, BSN, [place] [facility] [place], [facility] [phone]

## 2015-08-11 NOTE — Care Management Important Message (Signed)
Important Message  Patient Details  Name: Kent [name] MRN: [id number] Date of Birth: Feb 06, 1966   Medicare Important Message Given:  Yes    Juliann Pulse A Damier Disano 08/11/2015, 9:52 AM

## 2015-08-11 NOTE — Care Management (Signed)
Persistent fever.  CXR for 11/30 to rule out as[piration pneumonia

## 2015-08-12 ENCOUNTER — Inpatient Hospital Stay

## 2015-08-12 DIAGNOSIS — N319 Neuromuscular dysfunction of bladder, unspecified: Secondary | ICD-10-CM

## 2015-08-12 DIAGNOSIS — R4182 Altered mental status, unspecified: Secondary | ICD-10-CM

## 2015-08-12 DIAGNOSIS — N390 Urinary tract infection, site not specified: Secondary | ICD-10-CM

## 2015-08-12 LAB — PROTIME-INR
INR: 1.45
PROTHROMBIN TIME: 17.7 s — AB (ref 11.4–15.0)

## 2015-08-12 MED ORDER — FUROSEMIDE 10 MG/ML IJ SOLN
40.0000 mg | Freq: Once | INTRAMUSCULAR | Status: AC
  Administered 2015-08-12: 40 mg via INTRAVENOUS

## 2015-08-12 MED ORDER — FUROSEMIDE 10 MG/ML IJ SOLN
INTRAMUSCULAR | Status: AC
  Filled 2015-08-12: qty 4

## 2015-08-12 MED ORDER — WARFARIN SODIUM 5 MG PO TABS
5.0000 mg | ORAL_TABLET | Freq: Once | ORAL | Status: AC
  Administered 2015-08-12: 5 mg via ORAL
  Filled 2015-08-12: qty 1

## 2015-08-12 MED ORDER — FUROSEMIDE 10 MG/ML IJ SOLN: 40.0000 mg | Freq: Every day | INTRAMUSCULAR | Status: DC

## 2015-08-12 NOTE — Consult Note (Signed)
@ENCDATE @ 3:21 PM   Kent [name] 31-Aug-1966 [id number]  Referring provider: Dr. Gardiner Coins  Chief Complaint  Patient presents with  . Altered Mental Status    HPI: The patient is a 49 year old male with frontotemporal dementia, biopsy, and a neurogenic bladder is managed SP tube drainage Botox injections. He is a patient of Dr. Edrick Oh. He's admitted to the hospital for aspiration pneumonia and a urinary tract infection. Urology is consulted to change the SP tube.   PMH: Past Medical History  Diagnosis Date  . Frontotemporal dementia   . Sleep apnea   . Hypertension   . Depression   . Neurogenic bladder   . Myelopathy ([place])   . Obesity   . Degeneration, intervertebral disc, cervical   . Elevated liver function tests   . Pulmonary embolus (HCC)     on chronic anticoagulation  . Chronic UTI (urinary tract infection)   . Pernicious anemia   . Rosacea   . BPH (benign prostatic hypertrophy)   . History of DVT (deep vein thrombosis)   . History of pulmonary embolism     currently on coumadin  . Anxiety   . New onset a-fib [facility]) 2013    Surgical History: Past Surgical History  Procedure Laterality Date  . Suprapubic catheter insertion    . Kidney stone surgery      Home Medications:    Medication List    ASK your doctor about these medications        ALPRAZolam 0.25 MG tablet  Commonly known as:  XANAX  Take 1 tablet (0.25 mg total) by mouth 3 (three) times daily as needed for anxiety.     benzonatate 100 MG capsule  Commonly known as:  TESSALON  TAKE ONE CAPSULE BY MOUTH EVERY 8 HOURS AS NEEDED     citalopram 20 MG tablet  Commonly known as:  CELEXA  Take 20 mg by mouth 2 (two) times daily.     clindamycin 1 % external solution  Commonly known as:  CLEOCIN T  Apply 1 application topically 3 (three) times daily. Pt applies to scalp.     clotrimazole-betamethasone cream  Commonly known as:  LOTRISONE  APPLY TO WOUND TWICE A DAY FOR 10 DAYS      cyanocobalamin 1000 MCG/ML injection  Commonly known as:  (VITAMIN B-12)  Inject 1,000 mcg into the muscle every 30 (thirty) days.     erythromycin ophthalmic ointment  INSTILL 1 DROP INTO EACH EYE 3 TIMES A DAY     flecainide 50 MG tablet  Commonly known as:  TAMBOCOR  Take 50 mg by mouth 2 (two) times daily.     fluticasone 50 MCG/ACT nasal spray  Commonly known as:  FLONASE  Place 1 spray into both nostrils 2 (two) times daily.     furosemide 20 MG tablet  Commonly known as:  LASIX  Take 1 tablet (20 mg total) by mouth daily.     gluconic acid-citric acid irrigation  Generic drug:  gluconic acid-citric acid  Irrigate with 30 mLs as directed 3 (three) times daily.     ipratropium-albuterol 0.5-2.5 (3) MG/3ML Soln  Commonly known as:  DUONEB  Take 3 mLs by nebulization 4 (four) times daily as needed (for shortness of breath).     levofloxacin 500 MG tablet  Commonly known as:  LEVAQUIN  Take 1 tablet (500 mg total) by mouth daily.     LORazepam 0.5 MG tablet  Commonly known as:  ATIVAN  Take 0.5 mg by mouth 2 (two) times daily.     lovastatin 40 MG tablet  Commonly known as:  MEVACOR  TAKE 1 TABLET BY MOUTH DAILY     metoprolol tartrate 25 MG tablet  Commonly known as:  LOPRESSOR  Take 25 mg by mouth 2 (two) times daily.     nitrofurantoin 100 MG capsule  Commonly known as:  MACRODANTIN  Take 100 mg by mouth every evening.     nystatin 100000 UNIT/GM Powd  Apply topically daily.     omeprazole 40 MG capsule  Commonly known as:  PRILOSEC  TAKE ONE CAPSULE BY MOUTH EVERY DAY     oxybutynin 5 MG tablet  Commonly known as:  DITROPAN  Take 5 mg by mouth 3 (three) times daily.     polyethylene glycol packet  Commonly known as:  MIRALAX / GLYCOLAX  Take 17 g by mouth daily.     QUEtiapine 50 MG tablet  Commonly known as:  SEROQUEL  Take 50 mg by mouth at bedtime.     URELLE 81 MG Tabs tablet  Take 1 tablet (81 mg total) by mouth 2 (two) times daily.       UROGESIC-BLUE 81.6 MG Tabs  Take 1 tablet by mouth 2 (two) times daily.     warfarin 5 MG tablet  Commonly known as:  COUMADIN  Take 0.5-1 tablets (2.5-5 mg total) by mouth daily. Pt alternates between 2.5 mg and 5 mg on an every other day basis.        Allergies:  Allergies  Allergen Reactions  . Codeine Nausea Only  . Piperacillin-Tazobactam In Dex Rash and Other (See Comments)    Arm erythema on administration, but tolerated cephalosporins fine.    . Vancomycin Rash and Other (See Comments)    Reaction:  Red man syndrome   . Zosyn [Piperacillin Sod-Tazobactam So] Rash and Other (See Comments)    Pt experienced arm erythema on administration, but tolerated cephalosporins fine.      Family History: Family History  Problem Relation Age of Onset  . Hypertension Father   . Hyperlipidemia Father   . Hypertension Mother     Social History:  reports that he has never smoked. He does not have any smokeless tobacco history on file. He reports that he does not drink alcohol or use illicit drugs.  ROS: Unable to obtain secondary to dementia                                        Physical Exam: BP 123/71 mmHg  Pulse 83  Temp(Src) 98.5 F (36.9 C) (Axillary)  Resp 21  Ht 5\' 9"  (1.753 m)  Wt 313 lb 11.2 oz (142.293 kg)  BMI 46.30 kg/m2  SpO2 95%  Constitutional:  Alert and oriented, No acute distress. HEENT: [place] AT, moist mucus membranes.  Trachea midline, no masses. Cardiovascular: No clubbing, cyanosis, or edema. Respiratory: Normal respiratory effort, no increased work of breathing. GI: Abdomen is soft, nontender, nondistended, no abdominal masses GU: No CVA tenderness. SP tube in place during clear urine. Skin: No rashes, bruises or suspicious lesions. Lymph: No cervical or inguinal adenopathy. Neurologic: Grossly intact, no focal deficits, moving all 4 extremities. Psychiatric: Normal mood and affect.  Laboratory Data: Lab Results   Component Value Date   WBC 8.6 08/10/2015   HGB 10.6* 08/10/2015   HCT 32.1*  08/10/2015   MCV 86.7 08/10/2015   PLT 226 08/10/2015    Lab Results  Component Value Date   CREATININE <0.30* 08/09/2015    No results found for: PSA  No results found for: TESTOSTERONE  Lab Results  Component Value Date   HGBA1C 5.3 08/06/2012    Urinalysis    Component Value Date/Time   COLORURINE BLUE* 08/09/2015 0048   COLORURINE Yellow 01/01/2015 1736   APPEARANCEUR CLOUDY* 08/09/2015 0048   APPEARANCEUR Cloudy 01/01/2015 1736   LABSPEC 1.020 08/09/2015 0048   LABSPEC 1.015 01/01/2015 1736   PHURINE 6.0 08/09/2015 0048   PHURINE 6.0 01/01/2015 1736   GLUCOSEU NEGATIVE 08/09/2015 0048   GLUCOSEU Negative 01/01/2015 1736   HGBUR 1+* 08/09/2015 0048   HGBUR Negative 01/01/2015 1736   BILIRUBINUR NEGATIVE 08/09/2015 0048   BILIRUBINUR Negative 01/01/2015 1736   KETONESUR NEGATIVE 08/09/2015 0048   KETONESUR Negative 01/01/2015 1736   PROTEINUR 30* 08/09/2015 0048   PROTEINUR 30 mg/dL 01/01/2015 1736   NITRITE POSITIVE* 08/09/2015 0048   NITRITE Positive 01/01/2015 1736   LEUKOCYTESUR 3+* 08/09/2015 0048   LEUKOCYTESUR 3+ 01/01/2015 1736   The patient's suprapubic tube was exchanged for 18 French silicone catheter using sterile technique. There is clear drainage of urine after SP tube placement.  Assessment & Plan:    1. Neurogenic bladder -The patient's suprapubic tube was changed bedside today. He will need to follow-up with Dr. Edrick Oh in 1 month for SP tube change.  2. Systematic UTI -Continue antibiotic spine culture and sensitivity results.  Nickie Retort, MD   [facility] [address], [place], [place] [address] [phone]

## 2015-08-12 NOTE — Progress Notes (Signed)
Pt NPO for barium swallow to confirm or deny aspiration pneumonia. Dr Lavetta Nielsen paged to clarify if pt was to receive PO meds (w applesauce). Per Dr Lavetta Nielsen, attempt to give meds and monitor pt for signs of aspiration. All night time meds given successfully with no s/s of respiratory distress.

## 2015-08-12 NOTE — Progress Notes (Signed)
Visit made. Patient with acute respiratory distress early this morning. now on Hiflo nasal cannula. Per chart note review repeat chest xray shows some slight improvement over chest xray done on 11/30, suspect aspiration pneumonia. Patient remains on IV antibiotics for treatment.  Patient seen, lying in bed eyes closed, respiratory rate even and unlabored at 24. Patient did open his eyes to voice and gentle touch. Patient's father at bedside, he voiced understanding of this mornings events and was present. He confirmed DNR status. Patient febrile at 101 axillary  and will receive tylenol. Staff RN Amy in to give medications, patient's mother and caregiver present as well. Respiratory Therapist in to titrate Hiflo. Will continue to follow and update hospice team. Flo Shanks RN, BSN, [place] [facility] [place], [facility] [phone] c

## 2015-08-12 NOTE — Progress Notes (Signed)
Patient on cpap this AM and saturation was 83%.  Changed to Kent [name] and saturated only about 85%.  Patient given breathing treatment, 40mg  lasix given, put on nonrebreather and then switched to HFNC at 95%.   Dr. Pilar Jarvis changed suprapubic catheter.    HFNC now on 85%.  Patient 02 saturation staying in mid 90s but very dyspneic with any exertion in bed.  Coarse rhonchi noted in all lung [place].    Patient remains nonverbal (baseline), family and caregiver at his side.    Incontinent of bowels.

## 2015-08-12 NOTE — Progress Notes (Signed)
ANTICOAGULATION CONSULT NOTE - Initial Consult  Pharmacy Consult for warfarin Indication: VTE prophylaxis/atrial fibrillation  Allergies  Allergen Reactions  . Codeine Nausea Only  . Piperacillin-Tazobactam In Dex Rash and Other (See Comments)    Arm erythema on administration, but tolerated cephalosporins fine.    . Vancomycin Rash and Other (See Comments)    Reaction:  Red man syndrome   . Zosyn [Piperacillin Sod-Tazobactam So] Rash and Other (See Comments)    Pt experienced arm erythema on administration, but tolerated cephalosporins fine.     Patient Measurements: Height: 5\' 9"  (175.3 cm) Weight: (!) 313 lb 11.2 oz (142.293 kg) IBW/kg (Calculated) : 70.7  Vital Signs: Temp: 101.1 F (38.4 C) (12/01 1129) Temp Source: Axillary (12/01 1129) BP: 123/71 mmHg (12/01 1129) Pulse Rate: 83 (12/01 1129)  Labs:  Recent Labs  08/10/15 0422 08/11/15 0405 08/12/15 0436  HGB 10.6*  --   --   HCT 32.1*  --   --   PLT 226  --   --   LABPROT 30.2* 20.8* 17.7*  INR 2.95 1.79 1.45    CrCl cannot be calculated (Patient has no serum creatinine result on file.).   Medical History: Past Medical History  Diagnosis Date  . Frontotemporal dementia   . Sleep apnea   . Hypertension   . Depression   . Neurogenic bladder   . Myelopathy ([place])   . Obesity   . Degeneration, intervertebral disc, cervical   . Elevated liver function tests   . Pulmonary embolus (HCC)     on chronic anticoagulation  . Chronic UTI (urinary tract infection)   . Pernicious anemia   . Rosacea   . BPH (benign prostatic hypertrophy)   . History of DVT (deep vein thrombosis)   . History of pulmonary embolism     currently on coumadin  . Anxiety   . New onset a-fib [facility]) 2013   Assessment: Pharmacy consulted to dose warfarin in this 49 year old male admitted with altered mental status. Patient was taking warfarin prior to admission for a history of DVT/PE as well as newly diagnosed atrial fibrillation.  The patient's home regimen was warfarin 5 mg PO daily.  Patients INR on admission was supratherapeutic at 4.73. Patient did not receive vitamin K.  Patients INR today is subtherapeutic at 1.45  Dosing history: Date INR Dose 11/28 4.73 HELD 11/29 2.95 2.5 mg 11/30 1.79 5 mg  Goal of Therapy:  INR 2-3 Monitor platelets by anticoagulation protocol: Yes   Plan:  Patient's INR today is lower than yesterday, but is a reflection of the held warfarin dose from 3 days ago on 11/28. It takes 3 days to see the effects of a dose increase in warfarin so will continue with 5 mg dose today.  Will give another dose of warfarin 5 mg today  INR and CBC ordered with AM labs tomorrow  Pharmacy will continue to monitor.  Darylene Price Adalena Abdulla 08/12/2015,2:06 PM

## 2015-08-12 NOTE — Care Management (Signed)
Patient is having difficulty maintaining 02 sats.  Reported during progression that patient dropping his sats into the 80's on 6 liters nasal cannula.  Patient placed on nonrebreather at 100% then placed on HFNC.  Discussed need for palliative consult for goals of treatment.  Patient is currently DNR and followed by hospice

## 2015-08-12 NOTE — Progress Notes (Signed)
[facility] [place] NAME: Kent [name]    MR#:  [id number]  DATE OF BIRTH:  1966-08-03  SUBJECTIVE:   Patient with acute respiratory distress and placed on high flow oxygen. Father at bedside.  REVIEW OF SYSTEMS:    Review of Systems  Unable to perform ROS: dementia    Tolerating Diet: Dysphagia diet      DRUG ALLERGIES:   Allergies  Allergen Reactions  . Codeine Nausea Only  . Piperacillin-Tazobactam In Dex Rash and Other (See Comments)    Arm erythema on administration, but tolerated cephalosporins fine.    . Vancomycin Rash and Other (See Comments)    Reaction:  Red man syndrome   . Zosyn [Piperacillin Sod-Tazobactam So] Rash and Other (See Comments)    Pt experienced arm erythema on administration, but tolerated cephalosporins fine.      VITALS:  Blood pressure 123/71, pulse 83, temperature 101.1 F (38.4 C), temperature source Axillary, resp. rate 21, height 5\' 9"  (1.753 m), weight 142.293 kg (313 lb 11.2 oz), SpO2 95 %.  PHYSICAL EXAMINATION:   Physical Exam  Constitutional:  Patient on high flow  Eyes: Pupils are equal, round, and reactive to light.  Neck: No tracheal deviation present.  Cardiovascular: Normal rate and normal heart sounds.   Pulmonary/Chest: He is in respiratory distress. He has no wheezes. He has no rales. He exhibits no tenderness.  Bilateral coarse breath sounds  Abdominal: Soft. Bowel sounds are normal. He exhibits no distension. There is no tenderness. There is no rebound.  Musculoskeletal: He exhibits no edema or tenderness.  Neurological:  Somnolent  Skin: Skin is warm.      LABORATORY PANEL:   CBC  Recent Labs Lab 08/10/15 0422  WBC 8.6  HGB 10.6*  HCT 32.1*  PLT 226   ------------------------------------------------------------------------------------------------------------------  Chemistries   Recent Labs Lab 08/09/15 0048 08/09/15 0529  NA 137 138  K  4.2 3.7  CL 105 108  CO2 25 25  GLUCOSE 117* 126*  BUN 15 13  CREATININE 0.39* <0.30*  CALCIUM 8.5* 7.4*  AST 31  --   ALT 42  --   ALKPHOS 84  --   BILITOT 0.7  --    ------------------------------------------------------------------------------------------------------------------  Cardiac Enzymes  Recent Labs Lab 08/09/15 0048  TROPONINI <0.03   ------------------------------------------------------------------------------------------------------------------  RADIOLOGY:  Dg Chest 1 View  08/11/2015  CLINICAL DATA:  Shortness of breath cough congestion altered mental status slurred speech EXAM: CHEST 1 VIEW COMPARISON:  08/09/2015 FINDINGS: Moderate cardiac enlargement stable. Left lung is clear. On the right, there is extensive opacity over the entire right lung with more dense opacity in the mid to lower lung zone and hazy opacity over the upper lobe. IMPRESSION: Limited inspiratory effect with developing opacity over the right lung. This could represent a combination of consolidation and possibly pleural fluid. Electronically Signed   By: Skipper Cliche M.D.   On: 08/11/2015 11:57   Dg Chest Port 1 View  08/12/2015  CLINICAL DATA:  Short of breath EXAM: PORTABLE CHEST 1 VIEW COMPARISON:  08/11/2015 FINDINGS: Hypoventilation with decreased lung volume and bibasilar atelectasis. Right pleural effusion slightly improved. Aeration in the right lung slightly improved. Negative for edema. IMPRESSION: Hypoventilation with bibasilar atelectasis right greater than left. Moderate right effusion shows mild improvement. Slightly improved aeration in the right upper lobe. Electronically Signed   By: Franchot Gallo M.D.   On: 08/12/2015 08:55     ASSESSMENT AND  PLAN:   49 year old male with known history of frontotemporal dementia who presented with fever and confusion.   1. Acute metabolic encephalopathy: This is secondary to sepsis secondary to Pseudomonas urinary tract infection along  with aspiration pneumonia.  2. Sepsis present on admission: This is secondary to urinary tract infection as well as probable aspiration pneumonia. Urinary culture shows Pseudomonas which is sensitive to meropenem. Chest x-ray is consistent with aspiration pneumonia. Patient will continue meropenem for this as this should be effective for aspiration pneumonia. I will have speech evaluation. I will also have pulmonary evaluation as the patient appears to have respiratory-wise decompensated.   3. Acute hypoxic respiratory failure: This is secondary to aspiration pneumonia. Pulmonary consultation. Continue current antibiotics. it appears during last hospitalization patient's family was aware of high risk for aspiration. M.D. at that time spoke with the mother and father about option. PEG feeding and they were not eager for this. Family wanted to continue oral feeding.  4. atrial fibrillation: Patient will continue on flecainide, metoprolol and Coumadin. Pharmacy is dosing Coumadin.     Management plans discussed with the patient's father who is in agreement.  CODE STATUS:  DO NOT RESUSCITATE discussed with patient's father  Critical careTOTAL TIME TAKING CARE OF THIS PATIENT:  35 minutes.   High risk for respiratory arrest  POSSIBLE D/C  3-5, DEPENDING ON CLINICAL CONDITION.   Rian Koon M.D on 08/12/2015 at 12:33 PM  Between 7am to 6pm - Pager - [phone] After 6pm go to [email or web address] - password [email or web address] [place] [facility]  Office  [phone]  CC: Primary care physician; Ashok Norris, MD  Note: This dictation was prepared with Dragon dictation along with smaller phrase technology. Any transcriptional errors that result from this process are unintentional.

## 2015-08-13 ENCOUNTER — Inpatient Hospital Stay

## 2015-08-13 LAB — BASIC METABOLIC PANEL
Anion gap: 6 (ref 5–15)
BUN: 15 mg/dL (ref 6–20)
CO2: 31 mmol/L (ref 22–32)
Calcium: 8.5 mg/dL — ABNORMAL LOW (ref 8.9–10.3)
Chloride: 105 mmol/L (ref 101–111)
Creatinine, Ser: 0.41 mg/dL — ABNORMAL LOW (ref 0.61–1.24)
GFR calc Af Amer: >60 mL/min (ref >60)
GFR calc non Af Amer: >60 mL/min (ref >60)
Glucose, Bld: 89 mg/dL (ref 65–99)
Potassium: 3 mmol/L — ABNORMAL LOW (ref 3.5–5.1)
Sodium: 142 mmol/L (ref 135–145)

## 2015-08-13 LAB — PROTIME-INR
INR: 1.58
PROTHROMBIN TIME: 18.9 s — AB (ref 11.4–15.0)

## 2015-08-13 LAB — CBC WITH DIFFERENTIAL/PLATELET
BASOS ABS: 0 10*3/uL (ref 0–0.1)
BASOS PCT: 1 %
EOS ABS: 0.2 10*3/uL (ref 0–0.7)
EOS PCT: 2 %
HCT: 32.6 % — ABNORMAL LOW (ref 40.0–52.0)
HEMOGLOBIN: 10.5 g/dL — AB (ref 13.0–18.0)
Lymphocytes Relative: 15 %
Lymphs Abs: 1.4 10*3/uL (ref 1.0–3.6)
MCH: 28.1 pg (ref 26.0–34.0)
MCHC: 32.2 g/dL (ref 32.0–36.0)
MCV: 87.4 fL (ref 80.0–100.0)
Monocytes Absolute: 1 10*3/uL (ref 0.2–1.0)
Monocytes Relative: 11 %
NEUTROS PCT: 71 %
Neutro Abs: 6.6 10*3/uL — ABNORMAL HIGH (ref 1.4–6.5)
PLATELETS: 210 10*3/uL (ref 150–440)
RBC: 3.73 MIL/uL — AB (ref 4.40–5.90)
RDW: 18.8 % — ABNORMAL HIGH (ref 11.5–14.5)
WBC: 9.2 10*3/uL (ref 3.8–10.6)

## 2015-08-13 MED ORDER — POTASSIUM CHLORIDE 10 MEQ/100ML IV SOLN
10.0000 meq | INTRAVENOUS | Status: AC
  Administered 2015-08-13 (×4): 10 meq via INTRAVENOUS
  Filled 2015-08-13 (×4): qty 100

## 2015-08-13 MED ORDER — WARFARIN SODIUM 5 MG PO TABS
5.0000 mg | ORAL_TABLET | Freq: Once | ORAL | Status: AC
  Administered 2015-08-13: 5 mg via ORAL
  Filled 2015-08-13: qty 1

## 2015-08-13 MED ORDER — POTASSIUM CHLORIDE 20 MEQ/15ML (10%) PO SOLN: 40.0000 meq | Freq: Once | ORAL | Status: DC

## 2015-08-13 NOTE — Progress Notes (Signed)
Dr. Benjie Karvonen made aware of temp, no new orders

## 2015-08-13 NOTE — Evaluation (Signed)
Objective Swallowing Evaluation:   Modified Barium Swallow Study Patient Details  Name: Kent [name] MRN: [id number] Date of Birth: 1966-05-28  Today's Date: 08/13/2015 Time: SLP Start Time (ACUTE ONLY): 1345-SLP Stop Time (ACUTE ONLY): 1431 SLP Time Calculation (min) (ACUTE ONLY): 46 min  Past Medical History:  Past Medical History  Diagnosis Date  . Frontotemporal dementia   . Sleep apnea   . Hypertension   . Depression   . Neurogenic bladder   . Myelopathy ([place])   . Obesity   . Degeneration, intervertebral disc, cervical   . Elevated liver function tests   . Pulmonary embolus (HCC)     on chronic anticoagulation  . Chronic UTI (urinary tract infection)   . Pernicious anemia   . Rosacea   . BPH (benign prostatic hypertrophy)   . History of DVT (deep vein thrombosis)   . History of pulmonary embolism     currently on coumadin  . Anxiety   . New onset a-fib [facility]) 2013   Past Surgical History:  Past Surgical History  Procedure Laterality Date  . Suprapubic catheter insertion    . Kidney stone surgery     HPI: Pt is a 49 y.o. male with a known history of *under temporal dementia, neurogenic bladder with indwelling suprapubic catheter, recent multidrug-resistant UTI, chronic UTIs, HTN, Obesity, GERD on PPI, sleep apnea, anxiety and other medical issues. Pt is nonverbal but communicates w/ phonations; he requires total assist w/ ADLs. Pt has been seen by ST services in the past and dx'd w/ Dysphagia and placed on a Dys. 1 w/ Nectar liquids diet. Father stated they give him thickened liquids at home; requires feeding. Pt admitted w/ pain starting last night in the upper right abd area. CT scan shows right sided pneumonia. Noted pt presented w/ congested respirations at baseline prior to po's. Pt has been NPO d/t increased concerns of aspiration. MD has spoken w/ parents who have decided to continue w/ an oral diet at this time w/ aspiration precautions; parents stated they  are "not ready" for a PEG at this time. Pt is on a Dys. 1 w/ Nectar liquids.   Subjective: pt lying in bed, awake/alert. Pt constantly phonating (no verbalizations); baseline Dementia.  Assessment / Plan / Recommendation  No flowsheet data found.    CHL IP TREATMENT RECOMMENDATION 05/15/2015  Treatment Recommendations Therapy as outlined in treatment plan below     Prognosis 05/15/2015  Prognosis for Safe Diet Advancement Fair  Barriers to Reach Goals Cognitive deficits;Severity of deficits  Barriers/Prognosis Comment --    CHL IP DIET RECOMMENDATION 05/19/2015  SLP Diet Recommendations --  Liquid Administration via --  Medication Administration --  Compensations Minimize environmental distractions;Slow rate;Small sips/bites;Multiple dry swallows after each bite/sip;Check for pocketing  Postural Changes --      CHL IP OTHER RECOMMENDATIONS 05/15/2015  Recommended Consults --  Oral Care Recommendations --  Other Recommendations Order thickener from pharmacy;Prohibited food (jello, ice cream, thin soups);Remove water pitcher      CHL IP FOLLOW UP RECOMMENDATIONS 05/19/2015  Follow up Recommendations Home health SLP      CHL IP FREQUENCY AND DURATION 05/15/2015  Speech Therapy Frequency (ACUTE ONLY) min 3x week  Treatment Duration 1 week        Subjective: Patient behavior: (alertness, ability to follow instructions, etc.): Patient is awake.  He is total assist, non-verbal  Chief complaint: recurrent respiratory infections   Objective:  Radiological Procedure: A videoflouroscopic evaluation of oral-preparatory, reflex initiation,  and pharyngeal phases of the swallow was performed; as well as a screening of the upper esophageal phase.  I. POSTURE: In MBS chair, requires max support to remain upright  II. VIEW: Lateral   III. COMPENSATORY STRATEGIES: N/A  IV. BOLUSES ADMINISTERED:   Thin Liquid: 2 teaspoon boluses   Nectar-thick Liquid: 2 teaspoon boluses    Honey-thick  Liquid: 2 teaspoon boluses   Puree: 2 teaspoon boluses   Mechanical Soft: 1/8 graham cracker in applesauce  V. RESULTS OF EVALUATION: A. ORAL PREPARATORY PHASE: (The lips, tongue, and velum are observed for strength and coordination): Disorganized posterior transfer and mastication       **Overall Severity Rating: Moderate  B. SWALLOW INITIATION/REFLEX: (The reflex is normal if "triggered" by the time the bolus reached the base of the tongue): at the pyriform sinuses  **Overall Severity Rating: Moderate-severe  C. PHARYNGEAL PHASE: (Pharyngeal function is normal if the bolus shows rapid, smooth, and continuous transit through the pharynx and there is no pharyngeal residue after the swallow): decreased tongue base retraction (with mild vallecular residue); decreased laryngeal vestibule closure  **Overall Severity Rating: Severe  D. LARYNGEAL PENETRATION: (Material entering into the laryngeal inlet/vestibule but not aspirated)   E. ASPIRATION: Silent aspiration nectar-thick and thin liquid during the swallow. None observed with solid or honey-thick liquid boluses, but barium appeared to be within the trachea so cannot rule out aspiration occuring between flouro shots  F. ESOPHAGEAL PHASE: (Screening of the upper esophagus) cannot view due to shoulder shadow  ASSESSMENT: This 49 year old man; with frontotemporal dementia and history of recurrent respiratory infection; is presenting with severe oropharyngeal dysphagia characterized by impaired oral control for efficient/effective posterior bolus transfer, delayed pharyngeal swallow initiation (triggers at the pyriform sinuses), reduced laryngeal vestibule closure, and reduced pharyngeal pressure generation.  There was observed silent aspiration during the swallow with nectar-thick and thin liquids.  There was not observed aspiration with solid or honey-thick liquid. However, there appeared to be barium in the trachea that could represent aspiration  occurring between flouro views.  The patient is at significant risk for prandial aspiration.  Observed aspiration during this study was silent, thus there is no clear clinical indication of the presence or absence of aspiration.  Recommend Dysphagia I diet with honey-thick liquid and strict aspiration precautions.  Recommend that fluid intake (honey-thick) be closely monitored as it is very difficult to meet hydration needs by mouth with very thick liquid.     PLAN/RECOMMENDATIONS:  A. Diet: Dysphagia I with honey-thick liquids   B. Swallowing Precautions: Strict   C. Recommended consultation to   D. Therapy recommendations SLP to follow up as needed during hospitalization   E. Results and recommendations were discussed with primary inpatient SLP       Leroy Sea, [place], Susie 08/13/2015, 2:43 PM

## 2015-08-13 NOTE — Care Management Important Message (Signed)
Important Message  Patient Details  Name: Labyron Nohl MRN: [id number] Date of Birth: 1966/07/26   Medicare Important Message Given:  Yes    Juliann Pulse A Omer Puccinelli 08/13/2015, 10:30 AM

## 2015-08-13 NOTE — Plan of Care (Signed)
Problem: Education: Goal: Knowledge of treatment and prevention of UTI/Pyleonephritis will improve Outcome: Progressing IV antibiotics, bladder irrigant tid  Problem: Safety: Goal: Ability to remain free from injury will improve Outcome: Progressing Fall precautions in place

## 2015-08-13 NOTE — Progress Notes (Signed)
To xray via stretcher and NRB mask

## 2015-08-13 NOTE — Progress Notes (Signed)
Gluconic acid-citric acid 87ml infused into bladder, foley clamped.

## 2015-08-13 NOTE — Progress Notes (Signed)
Tylenol supp given for temp 101.0, will monitor.

## 2015-08-13 NOTE — Progress Notes (Signed)
ANTICOAGULATION CONSULT NOTE - Initial Consult  Pharmacy Consult for warfarin Indication: VTE prophylaxis/atrial fibrillation  Allergies  Allergen Reactions  . Codeine Nausea Only  . Piperacillin-Tazobactam In Dex Rash and Other (See Comments)    Arm erythema on administration, but tolerated cephalosporins fine.    . Vancomycin Rash and Other (See Comments)    Reaction:  Red man syndrome   . Zosyn [Piperacillin Sod-Tazobactam So] Rash and Other (See Comments)    Pt experienced arm erythema on administration, but tolerated cephalosporins fine.     Patient Measurements: Height: 5\' 9"  (175.3 cm) Weight: (!) 313 lb 11.2 oz (142.293 kg) IBW/kg (Calculated) : 70.7  Vital Signs: Temp: 101 F (38.3 C) (12/02 0842) Temp Source: Oral (12/02 0842) BP: 123/70 mmHg (12/02 0842) Pulse Rate: 80 (12/02 0842)  Labs:  Recent Labs  08/11/15 0405 08/12/15 0436 08/13/15 0426  HGB  --   --  10.5*  HCT  --   --  32.6*  PLT  --   --  210  LABPROT 20.8* 17.7* 18.9*  INR 1.79 1.45 1.58  CREATININE  --   --  0.41*    Estimated Creatinine Clearance: 156.9 mL/min (by C-G formula based on Cr of 0.41).   Medical History: Past Medical History  Diagnosis Date  . Frontotemporal dementia   . Sleep apnea   . Hypertension   . Depression   . Neurogenic bladder   . Myelopathy ([place])   . Obesity   . Degeneration, intervertebral disc, cervical   . Elevated liver function tests   . Pulmonary embolus (HCC)     on chronic anticoagulation  . Chronic UTI (urinary tract infection)   . Pernicious anemia   . Rosacea   . BPH (benign prostatic hypertrophy)   . History of DVT (deep vein thrombosis)   . History of pulmonary embolism     currently on coumadin  . Anxiety   . New onset a-fib [facility]) 2013   Assessment: Pharmacy consulted to dose warfarin in this 49 year old male admitted with altered mental status. Patient was taking warfarin prior to admission for a history of DVT/PE as well as newly  diagnosed atrial fibrillation. The patient's home regimen was warfarin 5 mg PO daily.  Patients INR on admission was supratherapeutic at 4.73. Patient did not receive vitamin K.  Patients INR today is increasing but remains subtherapeutic at 1.58.  CHADs2 score = 1, atrial fibrillation controlled during admission  Dosing history: Date INR Dose 11/28 4.73 HELD 11/29 2.95 2.5 mg 11/30 1.79 5 mg 12/1 1.45 5 mg 12/2 1.58 5 mg  Goal of Therapy:  INR 2-3 Monitor platelets by anticoagulation protocol: Yes   Plan:  Patient's INR today has increased from yesterday, but remains subtherapeutic at 1.58. The increase in INR seen today is reflective of the 2.5 mg dose that was given on 11/29, therefore, I anticipate that INR will continue to increase tomorrow as a reflection of the dose increase to 5 mg on 11/30.   Will continue with warfarin 5 mg dose tonight at 1800. If INR remains subtherapeutic tomorrow, can consider a dose increase.   INR ordered with AM labs tomorrow. Pharmacy will continue to monitor.  Darylene Price Cardin Nitschke 08/13/2015,9:58 AM

## 2015-08-13 NOTE — Progress Notes (Signed)
Pt father upset that Gluconic acid irrigation is not being done exactly every 8 hours, explained to him about EPIC setting own times.  RN called pharmacy and requested that this be put on a 0600, 1400, 2200 schedule per family wishes.  Updated father this had been done.

## 2015-08-13 NOTE — Progress Notes (Signed)
[facility] [place]    MR#:  [id number]  DATE OF BIRTH:  1966/08/23  SUBJECTIVE:   Patient's O2 requirement is lower then yesterday.Family at bedside. Family very aware that patient is aspirating. Mother says patient would not like PEG.   REVIEW OF SYSTEMS:    Review of Systems  Unable to perform ROS: dementia    Tolerating Diet: NO aspiration     DRUG ALLERGIES:   Allergies  Allergen Reactions  . Codeine Nausea Only  . Piperacillin-Tazobactam In Dex Rash and Other (See Comments)    Arm erythema on administration, but tolerated cephalosporins fine.    . Vancomycin Rash and Other (See Comments)    Reaction:  Red man syndrome   . Zosyn [Piperacillin Sod-Tazobactam So] Rash and Other (See Comments)    Pt experienced arm erythema on administration, but tolerated cephalosporins fine.      VITALS:  Blood pressure 119/61, pulse 88, temperature 99.6 F (37.6 C), temperature source Oral, resp. rate 20, height 5\' 9"  (1.753 m), weight 142.293 kg (313 lb 11.2 oz), SpO2 95 %.  PHYSICAL EXAMINATION:   Physical Exam  Constitutional:  Patient on high flow  Eyes: Pupils are equal, round, and reactive to light.  Neck: No tracheal deviation present.  Cardiovascular: Normal rate and normal heart sounds.   Pulmonary/Chest: He is in respiratory distress. He has no wheezes. He has no rales. He exhibits no tenderness.  Bilateral coarse breath sounds  Abdominal: Soft. Bowel sounds are normal. He exhibits no distension. There is no tenderness. There is no rebound.  Musculoskeletal: He exhibits no edema or tenderness.  Neurological:  Somnolent  Skin: Skin is warm.      LABORATORY PANEL:   CBC  Recent Labs Lab 08/13/15 0426  WBC 9.2  HGB 10.5*  HCT 32.6*  PLT 210   ------------------------------------------------------------------------------------------------------------------  Chemistries   Recent  Labs Lab 08/09/15 0048  08/13/15 0426  NA 137  < > 142  K 4.2  < > 3.0*  CL 105  < > 105  CO2 25  < > 31  GLUCOSE 117*  < > 89  BUN 15  < > 15  CREATININE 0.39*  < > 0.41*  CALCIUM 8.5*  < > 8.5*  AST 31  --   --   ALT 42  --   --   ALKPHOS 84  --   --   BILITOT 0.7  --   --   < > = values in this interval not displayed. ------------------------------------------------------------------------------------------------------------------  Cardiac Enzymes  Recent Labs Lab 08/09/15 0048  TROPONINI <0.03   ------------------------------------------------------------------------------------------------------------------  RADIOLOGY:  Dg Chest Port 1 View  08/12/2015  CLINICAL DATA:  Short of breath EXAM: PORTABLE CHEST 1 VIEW COMPARISON:  08/11/2015 FINDINGS: Hypoventilation with decreased lung volume and bibasilar atelectasis. Right pleural effusion slightly improved. Aeration in the right lung slightly improved. Negative for edema. IMPRESSION: Hypoventilation with bibasilar atelectasis right greater than left. Moderate right effusion shows mild improvement. Slightly improved aeration in the right upper lobe. Electronically Signed   By: Franchot Gallo M.D.   On: 08/12/2015 08:55     ASSESSMENT AND PLAN:   49 year old male with known history of frontotemporal dementia who presented with fever and confusion.   1. Acute metabolic encephalopathy with frontotemporal dementia: This is secondary to sepsis secondary to Pseudomonas urinary tract infection along with aspiration pneumonia.  2. Sepsis present on admission: This is secondary  to urinary tract infection as well as probable aspiration pneumonia. Urinary culture shows Pseudomonas which is sensitive to meropenem. Chest x-ray is consistent with aspiration pneumonia. Patient will continue meropenem for this as this should be effective for aspiration pneumonia. Patient is chronically aspirating. Family is well aware of patient's  aspiration risk. Patient will undergo barium swallow this afternoon. This is just to identify if patient is able to tolerate any food or drinks. We will be better able to tell the family if he will be able to tolerate any thing to eat by mouth. Patient would not want PEG tube as poor eye conversation with the patient's mother. Plan is to obtain barium swallow evaluation and let the family know the results.   3. Acute hypoxic respiratory failure: This is secondary to aspiration pneumonia.  Continue current antibiotics. Patient will continue to have fevers due to the fact the patient is aspirating. This was discussed with the family. 4. atrial fibrillation: Patient will continue on flecainide, metoprolol and Coumadin. Pharmacy is dosing Coumadin.     Management plans discussed with the patient's father and mother who are in agreement.  CODE STATUS:  DO NOT RESUSCITATE discussed with patient's father  Critical care TOTAL TIME TAKING CARE OF THIS PATIENT:  35 minutes.   High risk for respiratory arrest    Depending on barium swallow evaluation patient with a likely be able to be discharged over the weekend. He already has hospice available. Plan discussed with the family.  Jamesyn Moorefield M.D on 08/13/2015 at 12:56 PM  Between 7am to 6pm - Pager - [phone] After 6pm go to [email or web address] - password [email or web address] [facility]  Office  [phone]  CC: Primary care physician; Ashok Norris, MD  Note: This dictation was prepared with Dragon dictation along with smaller phrase technology. Any transcriptional errors that result from this process are unintentional.

## 2015-08-13 NOTE — Consult Note (Signed)
Pulmonary Critical Care  Initial Consult Note   Kent [name] [id number] DOB: May 11, 1966 DOA: 08/09/2015  Referring physician: Bettey Costa, MD PCP: Ashok Norris, MD   Chief Complaint: Pleural effusion  HPI: Kent [name] is a 49 y.o. male with history of dementia sleep apnea HTN PE presented with altered mental status. Patient was more confused on presentation. Patient family had noted this. Patient was noted to have possible aspiration on evaluation. Patient also was significantly hypoxic. ON presentation he had a CXR done and this shows presence of right sided pleural effusion which may be contributing to the hypoxia.   Review of Systems:   ROS performed and is unable to provide  Past Medical History  Diagnosis Date  . Frontotemporal dementia   . Sleep apnea   . Hypertension   . Depression   . Neurogenic bladder   . Myelopathy ([place])   . Obesity   . Degeneration, intervertebral disc, cervical   . Elevated liver function tests   . Pulmonary embolus (HCC)     on chronic anticoagulation  . Chronic UTI (urinary tract infection)   . Pernicious anemia   . Rosacea   . BPH (benign prostatic hypertrophy)   . History of DVT (deep vein thrombosis)   . History of pulmonary embolism     currently on coumadin  . Anxiety   . New onset a-fib [facility]) 2013   Past Surgical History  Procedure Laterality Date  . Suprapubic catheter insertion    . Kidney stone surgery     Social History:  reports that he has never smoked. He does not have any smokeless tobacco history on file. He reports that he does not drink alcohol or use illicit drugs.  Allergies  Allergen Reactions  . Codeine Nausea Only  . Piperacillin-Tazobactam In Dex Rash and Other (See Comments)    Arm erythema on administration, but tolerated cephalosporins fine.    . Vancomycin Rash and Other (See Comments)    Reaction:  Red man syndrome   . Zosyn [Piperacillin Sod-Tazobactam So] Rash and Other (See  Comments)    Pt experienced arm erythema on administration, but tolerated cephalosporins fine.      Family History  Problem Relation Age of Onset  . Hypertension Father   . Hyperlipidemia Father   . Hypertension Mother     Prior to Admission medications   Medication Sig Start Date End Date Taking? Authorizing Provider  ALPRAZolam (XANAX) 0.25 MG tablet Take 1 tablet (0.25 mg total) by mouth 3 (three) times daily as needed for anxiety. 05/19/15  Yes Fritzi Mandes, MD  benzonatate (TESSALON) 100 MG capsule TAKE ONE CAPSULE BY MOUTH EVERY 8 HOURS AS NEEDED 05/26/15  Yes Ashok Norris, MD  citalopram (CELEXA) 20 MG tablet Take 20 mg by mouth 2 (two) times daily.   Yes Historical Provider, MD  clindamycin (CLEOCIN T) 1 % external solution Apply 1 application topically 3 (three) times daily. Pt applies to scalp.   Yes Historical Provider, MD  clotrimazole-betamethasone (LOTRISONE) cream APPLY TO WOUND TWICE A DAY FOR 10 DAYS 07/18/15  Yes Ashok Norris, MD  cyanocobalamin (,VITAMIN B-12,) 1000 MCG/ML injection Inject 1,000 mcg into the muscle every 30 (thirty) days.    Yes Historical Provider, MD  erythromycin ophthalmic ointment INSTILL 1 DROP INTO EACH EYE 3 TIMES A DAY 07/30/15  Yes Ashok Norris, MD  flecainide (TAMBOCOR) 50 MG tablet Take 50 mg by mouth 2 (two) times daily.   Yes Historical Provider, MD  fluticasone (FLONASE) 50 MCG/ACT nasal spray Place 1 spray into both nostrils 2 (two) times daily.   Yes Historical Provider, MD  furosemide (LASIX) 20 MG tablet Take 1 tablet (20 mg total) by mouth daily. 05/20/15  Yes Ashok Norris, MD  gluconic acid-citric acid (RENACIDIN) irrigation Irrigate with 30 mLs as directed 3 (three) times daily.   Yes Historical Provider, MD  ipratropium-albuterol (DUONEB) 0.5-2.5 (3) MG/3ML SOLN Take 3 mLs by nebulization 4 (four) times daily as needed (for shortness of breath).   Yes Historical Provider, MD  LORazepam (ATIVAN) 0.5 MG tablet Take 0.5 mg by mouth  2 (two) times daily.   Yes Historical Provider, MD  lovastatin (MEVACOR) 40 MG tablet TAKE 1 TABLET BY MOUTH DAILY 05/26/15  Yes Ashok Norris, MD  Methen-Hyosc-Meth Blue-Na Phos (UROGESIC-BLUE) 81.6 MG TABS Take 1 tablet by mouth 2 (two) times daily.   Yes Historical Provider, MD  metoprolol tartrate (LOPRESSOR) 25 MG tablet Take 25 mg by mouth 2 (two) times daily.    Yes Historical Provider, MD  nitrofurantoin (MACRODANTIN) 100 MG capsule Take 100 mg by mouth every evening.    Yes Historical Provider, MD  nystatin (MYCOSTATIN/NYSTOP) 100000 UNIT/GM POWD Apply topically daily.   Yes Historical Provider, MD  omeprazole (PRILOSEC) 40 MG capsule TAKE ONE CAPSULE BY MOUTH EVERY DAY 05/31/15  Yes Ashok Norris, MD  oxybutynin (DITROPAN) 5 MG tablet Take 5 mg by mouth 3 (three) times daily.   Yes Historical Provider, MD  polyethylene glycol (MIRALAX / GLYCOLAX) packet Take 17 g by mouth daily.   Yes Historical Provider, MD  QUEtiapine (SEROQUEL) 50 MG tablet Take 50 mg by mouth at bedtime.   Yes Historical Provider, MD  URELLE (URELLE/URISED) 81 MG TABS tablet Take 1 tablet (81 mg total) by mouth 2 (two) times daily. 05/19/15  Yes Fritzi Mandes, MD  warfarin (COUMADIN) 5 MG tablet Take 0.5-1 tablets (2.5-5 mg total) by mouth daily. Pt alternates between 2.5 mg and 5 mg on an every other day basis. Patient taking differently: Take 5 mg by mouth daily.  03/24/15  Yes Fritzi Mandes, MD  levofloxacin (LEVAQUIN) 500 MG tablet Take 1 tablet (500 mg total) by mouth daily. Patient not taking: Reported on 08/09/2015 05/19/15   Fritzi Mandes, MD   Physical Exam: Filed Vitals:   08/13/15 0842 08/13/15 1020 08/13/15 1106 08/13/15 1550  BP: 123/70  119/61   Pulse: 80 49 88   Temp: 101 F (38.3 C) 100.3 F (37.9 C) 99.6 F (37.6 C) 98.7 F (37.1 C)  TempSrc: Oral Oral Oral Oral  Resp: 20  20   Height:      Weight:      SpO2: 93% 75% 95%     Wt Readings from Last 3 Encounters:  08/10/15 142.293 kg (313 lb 11.2 oz)   05/14/15 132 kg (291 lb 0.1 oz)  03/22/15 129.593 kg (285 lb 11.2 oz)    General:  Appears calm and comfortable Eyes: PERRL, normal lids, irises & conjunctiva ENT: grossly normal hearing, lips & tongue Neck: no LAD, masses or thyromegaly Cardiovascular: RRR, no m/r/g Respiratory: diminished on the right side. Normal respiratory effort. Abdomen: soft, nontender Skin: no rash or induration seen on limited exam Musculoskeletal: unable to assess Psychiatric: unable to assess Neurologic: unable to assess.          Labs on Admission:  Basic Metabolic Panel:  Recent Labs Lab 08/09/15 0048 08/09/15 0529 08/13/15 0426  NA 137 138 142  K 4.2 3.7  3.0*  CL 105 108 105  CO2 25 25 31   GLUCOSE 117* 126* 89  BUN 15 13 15   CREATININE 0.39* <0.30* 0.41*  CALCIUM 8.5* 7.4* 8.5*   Liver Function Tests:  Recent Labs Lab 08/09/15 0048  AST 31  ALT 42  ALKPHOS 84  BILITOT 0.7  PROT 6.9  ALBUMIN 3.4*   No results for input(s): LIPASE, AMYLASE in the last 168 hours. No results for input(s): AMMONIA in the last 168 hours. CBC:  Recent Labs Lab 08/09/15 0048 08/09/15 0529 08/10/15 0422 08/13/15 0426  WBC 13.5* 11.5* 8.6 9.2  NEUTROABS 12.4*  --  6.8* 6.6*  HGB 12.5* 10.9* 10.6* 10.5*  HCT 39.1* 33.7* 32.1* 32.6*  MCV 86.5 87.0 86.7 87.4  PLT 278 230 226 210   Cardiac Enzymes:  Recent Labs Lab 08/09/15 0048  TROPONINI <0.03    BNP (last 3 results) No results for input(s): BNP in the last 8760 hours.  ProBNP (last 3 results) No results for input(s): PROBNP in the last 8760 hours.  CBG: No results for input(s): GLUCAP in the last 168 hours.  Radiological Exams on Admission: Dg Chest Port 1 View  08/12/2015  CLINICAL DATA:  Short of breath EXAM: PORTABLE CHEST 1 VIEW COMPARISON:  08/11/2015 FINDINGS: Hypoventilation with decreased lung volume and bibasilar atelectasis. Right pleural effusion slightly improved. Aeration in the right lung slightly improved.  Negative for edema. IMPRESSION: Hypoventilation with bibasilar atelectasis right greater than left. Moderate right effusion shows mild improvement. Slightly improved aeration in the right upper lobe. Electronically Signed   By: Franchot Gallo M.D.   On: 08/12/2015 08:55     EKG: Independently reviewed.  Assessment/Plan Active Problems:   Fever   SIRS (systemic inflammatory response syndrome) (HCC)   1. Right Sided Pleural Effusion -fluid appears to be significant -would consider a CT scan of the chest to further assess and consider thoracentesis diagnostically -it may also help to improve oxygenation I spoke to his mother she states she wants to discuss it with the family before making any decisions  2. Possible Aspiration -continue with antibiotics -family does not want PEG -hospice is involved   Time spent: 69min    I have personally obtained a history, examined the patient, evaluated laboratory and imaging results, formulated the assessment and plan and placed orders.  The Patient requires high complexity decision making for assessment and support.    Allyne Gee, MD [facility] Pulmonary Critical Care Medicine Sleep Medicine

## 2015-08-13 NOTE — Progress Notes (Signed)
Visit made. Patient seen lying in bed, continues on [employer] oxygen. He was more alert than at visit yesterday, no vocalization, respiratory rate even and unlabored. Per chart note review he had fever this morning of 101 orally. Tyenol suppository given.  Patient's father at bedside. Attending Dr. Benjie Karvonen in during in during visit. Discussed continued risks of aspiration. She also discussed the question of a feeding tube, Mr. Fagnani would like to speak with his wife. He did voice his understanding of the risks involved with the surgery for placement and also of continued risk of aspiration. He also decided that there was no need for the barium swallow to be performed today. Plan is for patient to remain on IV abt through the weekend.  Will continue to follow and update hospice team. Flo Shanks RN, BSN, [facility], [facility] [phone] c

## 2015-08-13 NOTE — Care Management (Signed)
There is discussion regarding peg tube placement

## 2015-08-13 NOTE — Progress Notes (Signed)
Pt back from radiology 

## 2015-08-13 NOTE — Progress Notes (Signed)
ANTIBIOTIC CONSULT NOTE - INITIAL  Pharmacy Consult for meropenem  Indication: UTI/aspiration pneumonia  Allergies  Allergen Reactions  . Codeine Nausea Only  . Piperacillin-Tazobactam In Dex Rash and Other (See Comments)    Arm erythema on administration, but tolerated cephalosporins fine.    . Vancomycin Rash and Other (See Comments)    Reaction:  Red man syndrome   . Zosyn [Piperacillin Sod-Tazobactam So] Rash and Other (See Comments)    Pt experienced arm erythema on administration, but tolerated cephalosporins fine.     Patient Measurements: Height: 5\' 9"  (175.3 cm) Weight: (!) 313 lb 11.2 oz (142.293 kg) IBW/kg (Calculated) : 70.7 Adjusted Body Weight: 98 kg  Vital Signs: Temp: 99.6 F (37.6 C) (12/02 1106) Temp Source: Oral (12/02 1106) BP: 119/61 mmHg (12/02 1106) Pulse Rate: 88 (12/02 1106)  Labs:  Recent Labs  08/13/15 0426  WBC 9.2  HGB 10.5*  PLT 210  CREATININE 0.41*   Estimated Creatinine Clearance: 156.9 mL/min (by C-G formula based on Cr of 0.41).  Microbiology: Recent Results (from the past 720 hour(s))  Urine culture     Status: None   Collection Time: 08/09/15 12:48 AM  Result Value Ref Range Status   Specimen Description URINE, RANDOM  Final   Special Requests Normal  Final   Culture 30,000 COLONIES/mL PSEUDOMONAS AERUGINOSA  Final   Report Status 08/11/2015 FINAL  Final   Organism ID, Bacteria PSEUDOMONAS AERUGINOSA  Final      Susceptibility   Pseudomonas aeruginosa - MIC*    CEFTAZIDIME 2 SENSITIVE Sensitive     CIPROFLOXACIN 2 INTERMEDIATE Intermediate     GENTAMICIN 4 SENSITIVE Sensitive     IMIPENEM 2 SENSITIVE Sensitive     PIP/TAZO Value in next row Sensitive      SENSITIVE16    CEFEPIME Value in next row Sensitive      SENSITIVE8    LEVOFLOXACIN Value in next row Intermediate      INTERMEDIATE4    * 30,000 COLONIES/mL PSEUDOMONAS AERUGINOSA  Blood Culture (routine x 2)     Status: None (Preliminary result)   Collection Time:  08/09/15 12:56 AM  Result Value Ref Range Status   Specimen Description BLOOD LEFT ANTECUBITAL  Final   Special Requests BOTTLES DRAWN AEROBIC AND ANAEROBIC 6CC  Final   Culture NO GROWTH 3 DAYS  Final   Report Status PENDING  Incomplete  Blood Culture (routine x 2)     Status: None (Preliminary result)   Collection Time: 08/09/15 12:56 AM  Result Value Ref Range Status   Specimen Description BLOOD RIGHT WRIST  Final   Special Requests BOTTLES DRAWN AEROBIC AND ANAEROBIC 4CC  Final   Culture NO GROWTH 3 DAYS  Final   Report Status PENDING  Incomplete   Medications:  Anti-infectives    Start     Dose/Rate Route Frequency Ordered Stop   08/11/15 1000  meropenem (MERREM) 1 g in sodium chloride 0.9 % 100 mL IVPB     1 g 200 mL/hr over 30 Minutes Intravenous 3 times per day 08/11/15 0914     08/09/15 2200  levofloxacin (LEVAQUIN) IVPB 750 mg  Status:  Discontinued     750 mg 100 mL/hr over 90 Minutes Intravenous Every 24 hours 08/09/15 0451 08/09/15 1421   08/09/15 1700  cefTRIAXone (ROCEPHIN) 1 g in dextrose 5 % 50 mL IVPB  Status:  Discontinued     1 g 100 mL/hr over 30 Minutes Intravenous Every 24 hours 08/09/15 1422 08/11/15  0815   08/09/15 0145  levofloxacin (LEVAQUIN) IVPB 750 mg     750 mg 100 mL/hr over 90 Minutes Intravenous  Once 08/09/15 0131 08/09/15 0424     Assessment: Pharmacy consulted to dose meropenem in this 49 year old male who has frontotemporal dementia and is bedbound and non-verbal at baseline and was brought into the ED by family for AMS and fever. Patient was diagnosed with a UTI and empirically started on ceftriaxone. After receiving ceftriaxone for two days, patient continued to spike fevers and antibiotics were broadened to meropenem. Patient now also being treated for aspiration pneumonia with meropenem. Tmax 101 overnight.  Urine culture resulted with 30,000 colonies of pseudomonas aeruginosa, sensitive to ceftazidime, gentamicin, imipenem, and  piperacillin/tazobactam.   Patient is currently on day #3 meropenem 1g IV q8h.  Patient is bedbound with low muscle mass so serum creatinine is low at 0.3 and is likely not an accurate representation of renal function with CrockG equation. No renal issues so will dose as if CrCl ~90 mL/min.  Plan:  Continue meropenem 1g IV q8h for pneumonia.  Pharmacy will continue to monitor, thank you for the consult.  Darylene Price Khaya Theissen 08/13/2015,11:42 AM

## 2015-08-14 LAB — BASIC METABOLIC PANEL
ANION GAP: 8 (ref 5–15)
BUN: 15 mg/dL (ref 6–20)
CHLORIDE: 104 mmol/L (ref 101–111)
CO2: 30 mmol/L (ref 22–32)
Calcium: 8.6 mg/dL — ABNORMAL LOW (ref 8.9–10.3)
Creatinine, Ser: 0.34 mg/dL — ABNORMAL LOW (ref 0.61–1.24)
GFR calc non Af Amer: >60 mL/min (ref >60)
Glucose, Bld: 100 mg/dL — ABNORMAL HIGH (ref 65–99)
POTASSIUM: 3.5 mmol/L (ref 3.5–5.1)
SODIUM: 142 mmol/L (ref 135–145)

## 2015-08-14 LAB — CULTURE, BLOOD (ROUTINE X 2)
CULTURE: NO GROWTH
CULTURE: NO GROWTH

## 2015-08-14 LAB — PROTIME-INR
INR: 1.84
Prothrombin Time: 21.2 seconds — ABNORMAL HIGH (ref 11.4–15.0)

## 2015-08-14 MED ORDER — WARFARIN SODIUM 5 MG PO TABS
5.0000 mg | ORAL_TABLET | Freq: Once | ORAL | Status: AC
  Administered 2015-08-14: 5 mg via ORAL
  Filled 2015-08-14: qty 1

## 2015-08-14 MED ORDER — IPRATROPIUM-ALBUTEROL 0.5-2.5 (3) MG/3ML IN SOLN
3.0000 mL | Freq: Four times a day (QID) | RESPIRATORY_TRACT | Status: DC
Start: 2015-08-14 — End: 2015-08-16
  Administered 2015-08-14 – 2015-08-16 (×8): 3 mL via RESPIRATORY_TRACT
  Filled 2015-08-14 (×9): qty 3

## 2015-08-14 MED ORDER — FUROSEMIDE 10 MG/ML IJ SOLN
20.0000 mg | Freq: Once | INTRAMUSCULAR | Status: AC
  Administered 2015-08-14: 20 mg via INTRAVENOUS
  Filled 2015-08-14: qty 2

## 2015-08-14 NOTE — Progress Notes (Signed)
This RN called pharmacy to inquire about coumadin dosing for tonight. At the moment, no coumadin is scheduled. Pharmacy to put in correct orders for coumadin dose tonight.

## 2015-08-14 NOTE — Progress Notes (Signed)
Called dr Volanda Napoleon because family wishes the pt to get urogenic blue.  Pharmacy was called and stated that it is non formulary and is not carried here at the hospital.  Family made aware and was given the option to bring it in to the facility  .

## 2015-08-14 NOTE — Progress Notes (Signed)
[facility] [place]    MR#:  [id number]  DATE OF BIRTH:  [birth year]  SUBJECTIVE:   Alert, no distress. Titrating down-flow nasal cannula   REVIEW OF SYSTEMS:    Review of Systems  Unable to perform ROS: dementia   Tolerating Diet: NO aspiration  DRUG ALLERGIES:   Allergies  Allergen Reactions  . Codeine Nausea Only  . Piperacillin-Tazobactam In Dex Rash and Other (See Comments)    Arm erythema on administration, but tolerated cephalosporins fine.    . Vancomycin Rash and Other (See Comments)    Reaction:  Red man syndrome   . Zosyn [Piperacillin Sod-Tazobactam So] Rash and Other (See Comments)    Pt experienced arm erythema on administration, but tolerated cephalosporins fine.      VITALS:  Blood pressure 100/63, pulse 74, temperature 98.7 F (37.1 C), temperature source Oral, resp. rate 19, height 5\' 9"  (1.753 m), weight 142.293 kg (313 lb 11.2 oz), SpO2 95 %.  PHYSICAL EXAMINATION:   Physical Exam  Constitutional: He is well-developed, well-nourished, and in no distress.  Patient on high flow  Eyes: Pupils are equal, round, and reactive to light.  Neck: No tracheal deviation present.  Cardiovascular: Normal rate and normal heart sounds.   Pulmonary/Chest: No respiratory distress. He has wheezes. He has rales. He exhibits no tenderness.  Bilateral coarse breath sounds  Abdominal: Soft. Bowel sounds are normal. He exhibits no distension. There is no tenderness. There is no rebound.  Musculoskeletal: He exhibits no edema or tenderness.  Neurological: He is alert.  Skin: Skin is warm and dry.   LABORATORY PANEL:   CBC  Recent Labs Lab 08/13/15 0426  WBC 9.2  HGB 10.5*  HCT 32.6*  PLT 210   ------------------------------------------------------------------------------------------------------------------  Chemistries   Recent Labs Lab 08/09/15 0048  08/14/15 1110  NA 137  < > 142   K 4.2  < > 3.5  CL 105  < > 104  CO2 25  < > 30  GLUCOSE 117*  < > 100*  BUN 15  < > 15  CREATININE 0.39*  < > 0.34*  CALCIUM 8.5*  < > 8.6*  AST 31  --   --   ALT 42  --   --   ALKPHOS 84  --   --   BILITOT 0.7  --   --   < > = values in this interval not displayed. ------------------------------------------------------------------------------------------------------------------  Cardiac Enzymes  Recent Labs Lab 08/09/15 0048  TROPONINI <0.03   ------------------------------------------------------------------------------------------------------------------  RADIOLOGY:  No results found.   ASSESSMENT AND PLAN:   49 year old male with known history of frontotemporal dementia who presented with fever and confusion.   1. Acute metabolic encephalopathy with frontotemporal dementia: Seems to be improving. Per his family he is back to his cognitive baseline. This is secondary to sepsis secondary to Pseudomonas urinary tract infection along with aspiration pneumonia.  2. Sepsis present on admission: Resolved  3. Acute hypoxic respiratory failure:  - Due to aspiration pneumonia and bilateral effusions - Continue dysphagia diet with aspiration precautions, family understands risks of aspiration, no PEG tube - Appreciate pulmonology consultation. Family does not want to pursue CT scan and thoracentesis - We'll give one dose Lasix today, continue current antibiotics, continue to taper down oxygen as possible in anticipation of discharge home with hospice  4. atrial fibrillation: Patient will continue on flecainide, metoprolol and Coumadin. Pharmacy is dosing Coumadin.  #5  urinary tract infection: In setting of suprapubic catheter. Catheter is functioning well. Infection with Pseudomonas. Continue meropenem.  Management plans discussed with the patient's sister, father and mother who are in agreement. Discussed goals of care. Family is not in favor of further invasive procedures.  Hospice is involved at home. They will discuss whether they feel comfortable taking him home with hospice or would prefer placement.  CODE STATUS:  DO NOT RESUSCITATE   Critical care TOTAL TIME TAKING CARE OF THIS PATIENT:  35 minutes.   High risk for respiratory arrest  Depending on barium swallow evaluation patient with a likely be able to be discharged over the weekend. He already has hospice available. Plan discussed with the family.  Myrtis Ser M.D on 08/14/2015 at 1:49 PM  Between 7am to 6pm - Pager - [phone] After 6pm go to [email or web address] - password [email or web address] [facility]  Office  [phone]  CC: Primary care physician; Ashok Norris, MD

## 2015-08-14 NOTE — Progress Notes (Signed)
ANTICOAGULATION CONSULT NOTE - Initial Consult  Pharmacy Consult for warfarin Indication: VTE prophylaxis/atrial fibrillation  Allergies  Allergen Reactions  . Codeine Nausea Only  . Piperacillin-Tazobactam In Dex Rash and Other (See Comments)    Arm erythema on administration, but tolerated cephalosporins fine.    . Vancomycin Rash and Other (See Comments)    Reaction:  Red man syndrome   . Zosyn [Piperacillin Sod-Tazobactam So] Rash and Other (See Comments)    Pt experienced arm erythema on administration, but tolerated cephalosporins fine.     Patient Measurements: Height: 5\' 9"  (175.3 cm) Weight: (!) 313 lb 11.2 oz (142.293 kg) IBW/kg (Calculated) : 70.7  Vital Signs: Temp: 98.8 F (37.1 C) (12/03 0410) Temp Source: Axillary (12/03 0410) BP: 129/65 mmHg (12/03 0410) Pulse Rate: 72 (12/03 0410)  Labs:  Recent Labs  08/12/15 0436 08/13/15 0426 08/14/15 0512  HGB  --  10.5*  --   HCT  --  32.6*  --   PLT  --  210  --   LABPROT 17.7* 18.9* 21.2*  INR 1.45 1.58 1.84  CREATININE  --  0.41*  --     Estimated Creatinine Clearance: 156.9 mL/min (by C-G formula based on Cr of 0.41).   Medical History: Past Medical History  Diagnosis Date  . Frontotemporal dementia   . Sleep apnea   . Hypertension   . Depression   . Neurogenic bladder   . Myelopathy ([place])   . Obesity   . Degeneration, intervertebral disc, cervical   . Elevated liver function tests   . Pulmonary embolus (HCC)     on chronic anticoagulation  . Chronic UTI (urinary tract infection)   . Pernicious anemia   . Rosacea   . BPH (benign prostatic hypertrophy)   . History of DVT (deep vein thrombosis)   . History of pulmonary embolism     currently on coumadin  . Anxiety   . New onset a-fib [facility]) 2013   Assessment: Pharmacy consulted to dose warfarin in this 49 year old male admitted with altered mental status. Patient was taking warfarin prior to admission for a history of DVT/PE as well as newly  diagnosed atrial fibrillation. The patient's home regimen was warfarin 5 mg PO daily.  Patients INR on admission was supratherapeutic at 4.73. Patient did not receive vitamin K.  Patients INR today is increasing but remains subtherapeutic at 1.58.  CHADs2 score = 1, atrial fibrillation controlled during admission  Dosing history: Date INR Dose 11/28 4.73 HELD 11/29 2.95 2.5 mg 11/30 1.79 5 mg 12/1 1.45 5 mg 12/2 1.58 5 mg 12/3     1.84       Goal of Therapy:  INR 2-3 Monitor platelets by anticoagulation protocol: Yes   Plan:  INR remains subtherapeutic but increased significantly. Will continue warfarin 5 mg daily for now. Patient will likely require less than 5 mg daily warfarin to maintain therapeutic INR.   INR ordered with AM labs tomorrow. Pharmacy will continue to monitor.  Ulice Dash D 08/14/2015,9:28 AM

## 2015-08-14 NOTE — Progress Notes (Addendum)
Blister noted to upper left cheek where HFNC makes contact with patients skin. Gauze wrapped around bilateral straps of cannula to buffer against skin to prevent further breakdown.

## 2015-08-15 LAB — CBC
HEMATOCRIT: 33.4 % — AB (ref 40.0–52.0)
HEMOGLOBIN: 10.8 g/dL — AB (ref 13.0–18.0)
MCH: 28.6 pg (ref 26.0–34.0)
MCHC: 32.2 g/dL (ref 32.0–36.0)
MCV: 88.6 fL (ref 80.0–100.0)
Platelets: 265 10*3/uL (ref 150–440)
RBC: 3.78 MIL/uL — AB (ref 4.40–5.90)
RDW: 18.6 % — ABNORMAL HIGH (ref 11.5–14.5)
WBC: 9.6 10*3/uL (ref 3.8–10.6)

## 2015-08-15 LAB — BASIC METABOLIC PANEL
ANION GAP: 6 (ref 5–15)
BUN: 18 mg/dL (ref 6–20)
CHLORIDE: 105 mmol/L (ref 101–111)
CO2: 31 mmol/L (ref 22–32)
Calcium: 8.6 mg/dL — ABNORMAL LOW (ref 8.9–10.3)
Creatinine, Ser: 0.36 mg/dL — ABNORMAL LOW (ref 0.61–1.24)
GFR calc Af Amer: >60 mL/min (ref >60)
GFR calc non Af Amer: >60 mL/min (ref >60)
GLUCOSE: 114 mg/dL — AB (ref 65–99)
POTASSIUM: 3.5 mmol/L (ref 3.5–5.1)
Sodium: 142 mmol/L (ref 135–145)

## 2015-08-15 LAB — PROTIME-INR
INR: 1.61
Prothrombin Time: 19.2 seconds — ABNORMAL HIGH (ref 11.4–15.0)

## 2015-08-15 LAB — BLOOD GAS, ARTERIAL
ALLENS TEST (PASS/FAIL): POSITIVE — AB
Acid-Base Excess: 9.6 mmol/L — ABNORMAL HIGH (ref 0.0–3.0)
Bicarbonate: 35.8 mEq/L — ABNORMAL HIGH (ref 21.0–28.0)
FIO2: 0.44
O2 Saturation: 95.9 %
PATIENT TEMPERATURE: 37
PCO2 ART: 54 mmHg — AB (ref 32.0–48.0)
PO2 ART: 79 mmHg — AB (ref 83.0–108.0)
pH, Arterial: 7.43 (ref 7.350–7.450)

## 2015-08-15 MED ORDER — WARFARIN SODIUM 5 MG PO TABS
7.5000 mg | ORAL_TABLET | Freq: Once | ORAL | Status: AC
  Administered 2015-08-15: 7.5 mg via ORAL
  Filled 2015-08-15: qty 2

## 2015-08-15 MED ORDER — NON FORMULARY: 81.6000 mg | Freq: Two times a day (BID) | Status: DC

## 2015-08-15 MED ORDER — FUROSEMIDE 10 MG/ML IJ SOLN
20.0000 mg | Freq: Once | INTRAMUSCULAR | Status: AC
  Administered 2015-08-15: 20 mg via INTRAVENOUS
  Filled 2015-08-15: qty 2

## 2015-08-15 MED ORDER — URELLE 81 MG PO TABS
1.0000 | ORAL_TABLET | Freq: Two times a day (BID) | ORAL | Status: DC
  Administered 2015-08-15 (×2): 81 mg via ORAL
  Filled 2015-08-15 (×4): qty 1

## 2015-08-15 NOTE — Progress Notes (Signed)
[facility] [place] NAME: Kent [name]    MR#:  [id number]  DATE OF BIRTH:  1965/09/27  SUBJECTIVE:   Groggy, less alert   REVIEW OF SYSTEMS:    Review of Systems  Unable to perform ROS: dementia   Tolerating Diet: NO aspiration  DRUG ALLERGIES:   Allergies  Allergen Reactions  . Codeine Nausea Only  . Piperacillin-Tazobactam In Dex Rash and Other (See Comments)    Arm erythema on administration, but tolerated cephalosporins fine.    . Vancomycin Rash and Other (See Comments)    Reaction:  Red man syndrome   . Zosyn [Piperacillin Sod-Tazobactam So] Rash and Other (See Comments)    Pt experienced arm erythema on administration, but tolerated cephalosporins fine.      VITALS:  Blood pressure 112/58, pulse 65, temperature 98.7 F (37.1 C), temperature source Oral, resp. rate 20, height 5\' 9"  (1.753 m), weight 142.293 kg (313 lb 11.2 oz), SpO2 94 %.  PHYSICAL EXAMINATION:   Physical Exam  Constitutional: He is well-developed, well-nourished, and in no distress.  Eyes: Pupils are equal, round, and reactive to light.  Neck: No tracheal deviation present.  Cardiovascular: Normal rate and normal heart sounds.   Pulmonary/Chest: No respiratory distress. He has wheezes. He has rales. He exhibits no tenderness.  Bilateral coarse breath sounds  Abdominal: Soft. Bowel sounds are normal. He exhibits no distension. There is no tenderness. There is no rebound.  Musculoskeletal: He exhibits no edema or tenderness.  Neurological: He is alert.  Skin: Skin is warm and dry.   LABORATORY PANEL:   CBC  Recent Labs Lab 08/15/15 0441  WBC 9.6  HGB 10.8*  HCT 33.4*  PLT 265   ------------------------------------------------------------------------------------------------------------------  Chemistries   Recent Labs Lab 08/09/15 0048  08/15/15 0441  NA 137  < > 142  K 4.2  < > 3.5  CL 105  < > 105  CO2 25  < > 31   GLUCOSE 117*  < > 114*  BUN 15  < > 18  CREATININE 0.39*  < > 0.36*  CALCIUM 8.5*  < > 8.6*  AST 31  --   --   ALT 42  --   --   ALKPHOS 84  --   --   BILITOT 0.7  --   --   < > = values in this interval not displayed. ------------------------------------------------------------------------------------------------------------------  Cardiac Enzymes  Recent Labs Lab 08/09/15 0048  TROPONINI <0.03   ------------------------------------------------------------------------------------------------------------------  RADIOLOGY:  No results found.   ASSESSMENT AND PLAN:   49 year old male with known history of frontotemporal dementia who presented with fever and confusion.   1. Acute metabolic encephalopathy with frontotemporal dementia: Waxing and waning. This is secondary to sepsis secondary to Pseudomonas urinary tract infection along with aspiration pneumonia.  2. Sepsis present on admission: Resolved  3. Acute hypoxic respiratory failure:  - Due to aspiration pneumonia and bilateral effusions - Continue dysphagia diet with aspiration precautions, family understands risks of aspiration, no PEG tube - Appreciate pulmonology consultation. Family does not want to pursue CT scan and thoracentesis - 02 demand is decreasing.  Now on nasal canula at 6 L. Lasix again today.  4. atrial fibrillation: Patient will continue on flecainide, metoprolol and Coumadin. Pharmacy is dosing Coumadin.  #5 urinary tract infection: In setting of suprapubic catheter. Catheter is functioning well. Infection with Pseudomonas. Continue meropenem.  Management plans discussed with the patient's sister, father and mother  who are in agreement. Discussed goals of care. Family is not in favor of further invasive procedures. Hospice is involved at home. They will discuss whether they feel comfortable taking him home with hospice or would prefer placement.  CODE STATUS:  DO NOT RESUSCITATE   Critical care  TOTAL TIME TAKING CARE OF THIS PATIENT:  35 minutes.   Now that he is on nasal canula can plan for discharge tomorrow. Family would like him to come home with hospice.  Myrtis Ser M.D on 08/15/2015 at 4:06 PM  Between 7am to 6pm - Pager - [phone] After 6pm go to [email or web address] - password [email or web address] [facility]  Office  [phone]  CC: Primary care physician; Ashok Norris, MD

## 2015-08-15 NOTE — Progress Notes (Addendum)
Per pharmacy, unable to use 'Urogesic-blue' medication due to new policy about being unable to use patients' medications. Pharmacy has placed orders for medication that is essentially the same, urelle. Patients home medication remains in pharmacy storage at this time.

## 2015-08-15 NOTE — Progress Notes (Signed)
ANTICOAGULATION CONSULT NOTE - Initial Consult  Pharmacy Consult for warfarin Indication: VTE prophylaxis/atrial fibrillation  Allergies  Allergen Reactions  . Codeine Nausea Only  . Piperacillin-Tazobactam In Dex Rash and Other (See Comments)    Arm erythema on administration, but tolerated cephalosporins fine.    . Vancomycin Rash and Other (See Comments)    Reaction:  Red man syndrome   . Zosyn [Piperacillin Sod-Tazobactam So] Rash and Other (See Comments)    Pt experienced arm erythema on administration, but tolerated cephalosporins fine.     Patient Measurements: Height: 5\' 9"  (175.3 cm) Weight: (!) 313 lb 11.2 oz (142.293 kg) IBW/kg (Calculated) : 70.7  Vital Signs: Temp: 99.1 F (37.3 C) (12/04 0522) Temp Source: Axillary (12/04 0522) BP: 108/63 mmHg (12/04 0612) Pulse Rate: 69 (12/04 0522)  Labs:  Recent Labs  08/13/15 0426 08/14/15 0512 08/14/15 1110 08/15/15 0441  HGB 10.5*  --   --  10.8*  HCT 32.6*  --   --  33.4*  PLT 210  --   --  265  LABPROT 18.9* 21.2*  --  19.2*  INR 1.58 1.84  --  1.61  CREATININE 0.41*  --  0.34* 0.36*    Estimated Creatinine Clearance: 156.9 mL/min (by C-G formula based on Cr of 0.36).   Medical History: Past Medical History  Diagnosis Date  . Frontotemporal dementia   . Sleep apnea   . Hypertension   . Depression   . Neurogenic bladder   . Myelopathy ([place])   . Obesity   . Degeneration, intervertebral disc, cervical   . Elevated liver function tests   . Pulmonary embolus (HCC)     on chronic anticoagulation  . Chronic UTI (urinary tract infection)   . Pernicious anemia   . Rosacea   . BPH (benign prostatic hypertrophy)   . History of DVT (deep vein thrombosis)   . History of pulmonary embolism     currently on coumadin  . Anxiety   . New onset a-fib [facility]) 2013   Assessment: Pharmacy consulted to dose warfarin in this 49 year old male admitted with altered mental status. Patient was taking warfarin prior to  admission for a history of DVT/PE as well as newly diagnosed atrial fibrillation. The patient's home regimen was warfarin 5 mg PO daily.  Patients INR on admission was supratherapeutic at 4.73. Patient did not receive vitamin K.  Dosing history: Date INR Dose 11/28 4.73 HELD 11/29 2.95 2.5 mg 11/30 1.79 5 mg 12/1 1.45 5 mg 12/2 1.58 5 mg 12/3     1.84     5 mg  12/4     1.61    Goal of Therapy:  INR 2-3 Monitor platelets by anticoagulation protocol: Yes   Plan:  INR remains subtherapeutic and decreased from yesterday. Will order warfarin 7.5 mg once tonight.   INR ordered with AM labs tomorrow. Pharmacy will continue to monitor.  Ulice Dash D 08/15/2015,7:47 AM

## 2015-08-15 NOTE — Plan of Care (Signed)
Problem: Skin Integrity: Goal: Risk for impaired skin integrity will decrease Outcome: Progressing Patient is on a bariatric, pressure re-distributing mattress to reduce risk of pressure ulcers.

## 2015-08-16 ENCOUNTER — Inpatient Hospital Stay

## 2015-08-16 DIAGNOSIS — J690 Pneumonitis due to inhalation of food and vomit: Secondary | ICD-10-CM | POA: Diagnosis present

## 2015-08-16 LAB — CBC
HEMATOCRIT: 35.2 % — AB (ref 40.0–52.0)
Hemoglobin: 11.3 g/dL — ABNORMAL LOW (ref 13.0–18.0)
MCH: 28.4 pg (ref 26.0–34.0)
MCHC: 32.2 g/dL (ref 32.0–36.0)
MCV: 88.4 fL (ref 80.0–100.0)
Platelets: 353 10*3/uL (ref 150–440)
RBC: 3.98 MIL/uL — AB (ref 4.40–5.90)
RDW: 19 % — ABNORMAL HIGH (ref 11.5–14.5)
WBC: 10.3 10*3/uL (ref 3.8–10.6)

## 2015-08-16 LAB — BASIC METABOLIC PANEL
ANION GAP: 6 (ref 5–15)
BUN: 15 mg/dL (ref 6–20)
CO2: 31 mmol/L (ref 22–32)
Calcium: 8.7 mg/dL — ABNORMAL LOW (ref 8.9–10.3)
Chloride: 105 mmol/L (ref 101–111)
Creatinine, Ser: 0.44 mg/dL — ABNORMAL LOW (ref 0.61–1.24)
GFR calc Af Amer: >60 mL/min (ref >60)
GFR calc non Af Amer: >60 mL/min (ref >60)
GLUCOSE: 126 mg/dL — AB (ref 65–99)
POTASSIUM: 3.8 mmol/L (ref 3.5–5.1)
Sodium: 142 mmol/L (ref 135–145)

## 2015-08-16 LAB — URINALYSIS COMPLETE WITH MICROSCOPIC (ARMC ONLY)
Bilirubin Urine: NEGATIVE
GLUCOSE, UA: 50 mg/dL — AB
Nitrite: NEGATIVE
Protein, ur: 30 mg/dL — AB
Specific Gravity, Urine: 1.028 (ref 1.005–1.030)
pH: 5 (ref 5.0–8.0)

## 2015-08-16 LAB — PROTIME-INR
INR: 1.75
PROTHROMBIN TIME: 20.4 s — AB (ref 11.4–15.0)

## 2015-08-16 MED ORDER — URELLE 81 MG PO TABS: 1.0000 | ORAL_TABLET | Freq: Two times a day (BID) | ORAL | Status: AC

## 2015-08-16 MED ORDER — LORAZEPAM 1 MG PO TABS: 1.0000 mg | ORAL_TABLET | ORAL | Status: AC | PRN

## 2015-08-16 MED ORDER — MORPHINE SULFATE 20 MG/5ML PO SOLN: 2.5000 mg | ORAL | Status: AC | PRN

## 2015-08-16 MED ORDER — KETOROLAC TROMETHAMINE 30 MG/ML IJ SOLN
15.0000 mg | Freq: Once | INTRAMUSCULAR | Status: AC
  Administered 2015-08-16: 15 mg via INTRAVENOUS
  Filled 2015-08-16: qty 1

## 2015-08-16 MED ORDER — WARFARIN SODIUM 5 MG PO TABS: 7.5000 mg | ORAL_TABLET | Freq: Once | ORAL | Status: DC

## 2015-08-16 NOTE — Progress Notes (Signed)
Notified by RT that patient's breathing had become more labored and that patient appeared agitated since last seen by RT. PRN Xanax given and will reassess in one hour. Nursing staff will continue to monitor. Earleen Reaper, RN

## 2015-08-16 NOTE — Discharge Summary (Signed)
[facility] [place] NAME: Kent [name]    MR#:  [id number]  DATE OF BIRTH:  [birth year]  DATE OF ADMISSION:  08/09/2015 ADMITTING PHYSICIAN: Saundra Shelling, MD  DATE OF DISCHARGE: 08/16/2015  PRIMARY CARE PHYSICIAN: Ashok Norris, MD    ADMISSION DIAGNOSIS:  UTI (lower urinary tract infection) [N39.0] Sepsis, due to unspecified organism ([place]) [A41.9] Fever, unspecified fever cause [R50.9] Altered mental status, unspecified altered mental status type [R41.82]  DISCHARGE DIAGNOSIS:  Active Problems:   Fever   SIRS (systemic inflammatory response syndrome) (HCC)   Aspiration pneumonia ([place])   SECONDARY DIAGNOSIS:   Past Medical History  Diagnosis Date  . Frontotemporal dementia   . Sleep apnea   . Hypertension   . Depression   . Neurogenic bladder   . Myelopathy ([place])   . Obesity   . Degeneration, intervertebral disc, cervical   . Elevated liver function tests   . Pulmonary embolus (HCC)     on chronic anticoagulation  . Chronic UTI (urinary tract infection)   . Pernicious anemia   . Rosacea   . BPH (benign prostatic hypertrophy)   . History of DVT (deep vein thrombosis)   . History of pulmonary embolism     currently on coumadin  . Anxiety   . New onset a-fib [facility]) 2013    HOSPITAL COURSE:   49 year old male with known history of frontotemporal dementia bed bound and nonverbal at baseline who presented with fever and confusion.   1. Acute metabolic encephalopathy with frontotemporal dementia: Secondary to sepsis due to Pseudomonas urinary tract infection along with aspiration pneumonia.  2. Sepsis present on admission: Resolved  3. Acute hypoxic respiratory failure: Has had several aspiration events during this hospitalization. PEG tube was discussed in detail and family decided not to have PEG tube placed. He as a fairly high risk for aspiration at all times. He does have aspiration  pneumonia and bilateral pleural effusions. Pulmonology was consulted and discussed possibility of CT to further quantify the effusion with subsequent thoracentesis which may decrease his oxygen need. The family was clear that they do not want any further invasive procedures for this patient and they did not want thoracentesis. He seemed to be improving during hospitalization weaning from BiPAP to high flow nasal cannula to regular nasal cannula. Unfortunately on the night prior to discharge he had an additional aspiration event causing acute respiratory failure, high fever and was placed back on CPAP for several hours. He is now resting comfortably on nasal cannula. Given the increasing frequency of aspiration events the family does not feel comfortable taking him home. They have opted to discharge to hospice home for constant monitoring and support. I agree with this decision. We will move to comfort care. We will provide morphine for pain and air hunger. Ativan for anxiety.  4. atrial fibrillation: Rate has been controlled. Will discontinue flecainide, metoprolol, Coumadin at this time as we're beginning to comfort measures.  #5 urinary tract infection: In setting of suprapubic catheter. Catheter is functioning well. Infection with Pseudomonas. Continue meropenem. We will discontinue meropenem at this time is removed to comfort measures. Continue catheter flushes.  #6 frontotemporal dementia: This patient has a long-standing history of on defined neuro degenerative condition. This started when he was 71. He has been very well taken care of by his parents at home since that time. He has had extensive evaluation by neurologist that multiple medical centers. He has continued  to decline. At this point he is bedbound and nonverbal. He is at very high risk for aspiration and the family has decided not to place a PEG tube. He is no longer eating. He has continued to have recurrent aspiration events throughout this  hospitalization with subsequent respiratory failure. After careful consideration and discussion we have decided to discharge to hospice home.  DISCHARGE CONDITIONS:   Discharge to hospice home  CONSULTS OBTAINED:  Treatment Team:  Nickie Retort, MD Allyne Gee, MD  DRUG ALLERGIES:   Allergies  Allergen Reactions  . Codeine Nausea Only  . Piperacillin-Tazobactam In Dex Rash and Other (See Comments)    Arm erythema on administration, but tolerated cephalosporins fine.    . Vancomycin Rash and Other (See Comments)    Reaction:  Red man syndrome   . Zosyn [Piperacillin Sod-Tazobactam So] Rash and Other (See Comments)    Pt experienced arm erythema on administration, but tolerated cephalosporins fine.      DISCHARGE MEDICATIONS:   Current Discharge Medication List    START taking these medications   Details  morphine 20 MG/5ML solution Take 0.6 mLs (2.4 mg total) by mouth every 2 (two) hours as needed for pain. Qty: 20 mL, Refills: 0      CONTINUE these medications which have CHANGED   Details  LORazepam (ATIVAN) 1 MG tablet Take 1 tablet (1 mg total) by mouth every 4 (four) hours as needed for anxiety. Qty: 30 tablet, Refills: 0    URELLE (URELLE/URISED) 81 MG TABS tablet Take 1 tablet (81 mg total) by mouth 2 (two) times daily. Qty: 120 each, Refills: 0      CONTINUE these medications which have NOT CHANGED   Details  citalopram (CELEXA) 20 MG tablet Take 20 mg by mouth 2 (two) times daily.    gluconic acid-citric acid (RENACIDIN) irrigation Irrigate with 30 mLs as directed 3 (three) times daily.      STOP taking these medications     ALPRAZolam (XANAX) 0.25 MG tablet      benzonatate (TESSALON) 100 MG capsule      clindamycin (CLEOCIN T) 1 % external solution      clotrimazole-betamethasone (LOTRISONE) cream      cyanocobalamin (,VITAMIN B-12,) 1000 MCG/ML injection      erythromycin ophthalmic ointment      flecainide (TAMBOCOR) 50 MG tablet       fluticasone (FLONASE) 50 MCG/ACT nasal spray      furosemide (LASIX) 20 MG tablet      ipratropium-albuterol (DUONEB) 0.5-2.5 (3) MG/3ML SOLN      lovastatin (MEVACOR) 40 MG tablet      Methen-Hyosc-Meth Blue-Na Phos (UROGESIC-BLUE) 81.6 MG TABS      metoprolol tartrate (LOPRESSOR) 25 MG tablet      nitrofurantoin (MACRODANTIN) 100 MG capsule      nystatin (MYCOSTATIN/NYSTOP) 100000 UNIT/GM POWD      omeprazole (PRILOSEC) 40 MG capsule      oxybutynin (DITROPAN) 5 MG tablet      polyethylene glycol (MIRALAX / GLYCOLAX) packet      QUEtiapine (SEROQUEL) 50 MG tablet      warfarin (COUMADIN) 5 MG tablet      levofloxacin (LEVAQUIN) 500 MG tablet      guaiFENesin (MUCINEX) 600 MG 12 hr tablet      HYDROcodone-acetaminophen (NORCO/VICODIN) 5-325 MG per tablet      ketoconazole (NIZORAL) 2 % shampoo          DISCHARGE INSTRUCTIONS:  Comfort care measures. Discharge to hospice home   Today   CHIEF COMPLAINT:   Chief Complaint  Patient presents with  . Altered Mental Status    HISTORY OF PRESENT ILLNESS:  Kent [name] is a 49 y.o. male with a known history of frontotemporal dementia, hypertension, sleep apnea, neurologic bladder, history of deep venous thrombosis and pulmonary embolism on Coumadin, atrial fibrillation, myelopathy presented to the emergency room because of fever and confusion. Patient's family noticed change in mental status and they were concerned. Patient is on home hospice and DO NOT RESUSCITATE by CODE STATUS. Patient is dependent on activities of daily living. Patient is mostly and completely bedbound. Patient was worked up with the emergency room and was found to have urinary tract infection. Patient is non-verbal and unable to give any history. Most of the information obtained from the patient's parents were at bedside. No history of any fall. No history of any GI bleed.  VITAL SIGNS:  Blood pressure 135/75, pulse 86, temperature 99.3 F  (37.4 C), temperature source Oral, resp. rate 18, height 5\' 9"  (1.753 m), weight 131.997 kg (291 lb), SpO2 96 %.  I/O:   Intake/Output Summary (Last 24 hours) at 08/16/15 1212 Last data filed at 08/16/15 1007  Gross per 24 hour  Intake    120 ml  Output   1055 ml  Net   -935 ml    PHYSICAL EXAMINATION:  GENERAL:  49 y.o.-year-old patient lying in the bed . CPAP mask in place, respiratory distress EYES: Pupils equal, round, reactive to light and accommodation. No scleral icterus. Extraocular muscles intact. Eyes do not focus but were of around the ceiling HEENT: Head atraumatic, normocephalic. Oropharynx and nasopharynx clear. Mucous membranes are dry NECK:  Supple, no jugular venous distention. No thyroid enlargement, no tenderness.  LUNGS: Short shallow respirations, diffuse wheezes, very tight, moderate respiratory distress CARDIOVASCULAR: S1, S2 normal. No murmurs, rubs, or gallops.  ABDOMEN: Soft, non-tender, non-distended. Bowel sounds present. No organomegaly or mass. Suprapubic catheter in place with no signs of erythema edema or exudate EXTREMITIES: +1 bilateral pedal edema, cyanosis, or clubbing.  NEUROLOGIC: Unable to participate in neurologic exam. At baseline minimal strength in upper extremities and paralyzed in the lower extremities  PSYCHIATRIC: Groggy, not making eye contact, anxious SKIN: No obvious rash, lesion, or ulcer.   DATA REVIEW:   CBC  Recent Labs Lab 08/16/15 0900  WBC 10.3  HGB 11.3*  HCT 35.2*  PLT 353    Chemistries   Recent Labs Lab 08/16/15 0900  NA 142  K 3.8  CL 105  CO2 31  GLUCOSE 126*  BUN 15  CREATININE 0.44*  CALCIUM 8.7*    Cardiac Enzymes No results for input(s): TROPONINI in the last 168 hours.  Microbiology Results  Results for orders placed or performed during the hospital encounter of 08/09/15  Urine culture     Status: None   Collection Time: 08/09/15 12:48 AM  Result Value Ref Range Status   Specimen  Description URINE, RANDOM  Final   Special Requests Normal  Final   Culture 30,000 COLONIES/mL PSEUDOMONAS AERUGINOSA  Final   Report Status 08/11/2015 FINAL  Final   Organism ID, Bacteria PSEUDOMONAS AERUGINOSA  Final      Susceptibility   Pseudomonas aeruginosa - MIC*    CEFTAZIDIME 2 SENSITIVE Sensitive     CIPROFLOXACIN 2 INTERMEDIATE Intermediate     GENTAMICIN 4 SENSITIVE Sensitive     IMIPENEM 2 SENSITIVE Sensitive  PIP/TAZO Value in next row Sensitive      SENSITIVE16    CEFEPIME Value in next row Sensitive      SENSITIVE8    LEVOFLOXACIN Value in next row Intermediate      INTERMEDIATE4    * 30,000 COLONIES/mL PSEUDOMONAS AERUGINOSA  Blood Culture (routine x 2)     Status: None   Collection Time: 08/09/15 12:56 AM  Result Value Ref Range Status   Specimen Description BLOOD LEFT ANTECUBITAL  Final   Special Requests BOTTLES DRAWN AEROBIC AND ANAEROBIC 6CC  Final   Culture NO GROWTH 5 DAYS  Final   Report Status 08/14/2015 FINAL  Final  Blood Culture (routine x 2)     Status: None   Collection Time: 08/09/15 12:56 AM  Result Value Ref Range Status   Specimen Description BLOOD RIGHT WRIST  Final   Special Requests BOTTLES DRAWN AEROBIC AND ANAEROBIC 4CC  Final   Culture NO GROWTH 5 DAYS  Final   Report Status 08/14/2015 FINAL  Final    RADIOLOGY:  Dg Chest Port 1 View  08/16/2015  CLINICAL DATA:  Fever.  Confusion. EXAM: PORTABLE CHEST 1 VIEW COMPARISON:  08/12/2015. FINDINGS: Mediastinum hilar structures normal. Right lower lobe atelectasis and/or infiltrate. No pleural effusion or pneumothorax. Stable cardiomegaly. No pulmonary venous congestion. O acute bony abnormality . IMPRESSION: 1. Right lower lobe atelectasis and/or infiltrate again noted. 2. Cardiomegaly.  No pulmonary venous congestion. Electronically Signed   By: Marcello Moores  Register   On: 08/16/2015 08:37    EKG:   Orders placed or performed during the hospital encounter of 08/09/15  . ED EKG 12-Lead  . ED  EKG 12-Lead  . EKG 12-Lead  . EKG 12-Lead      Management plans discussed with the patient, family and they are in agreement.  CODE STATUS:     Code Status Orders        Start     Ordered   08/09/15 0452  Do not attempt resuscitation (DNR)   Continuous    Question Answer Comment  In the event of cardiac or respiratory ARREST Do not call a "code blue"   In the event of cardiac or respiratory ARREST Do not perform Intubation, CPR, defibrillation or ACLS   In the event of cardiac or respiratory ARREST Use medication by any route, position, wound care, and other measures to relive pain and suffering. May use oxygen, suction and manual treatment of airway obstruction as needed for comfort.      08/09/15 0451    Advance Directive Documentation        Most Recent Value   Type of Advance Directive  Healthcare Power of Attorney   Pre-existing out of facility DNR order (yellow form or pink MOST form)     "MOST" Form in Place?        TOTAL TIME TAKING CARE OF THIS PATIENT: 50 minutes.  Greater than 50% of time spent in care coordination and counseling.  Myrtis Ser M.D on 08/16/2015 at 12:12 PM  Between 7am to 6pm - Pager - [phone]  After 6pm go to [email or web address] - password [email or web address] [facility]  Office  [phone]  CC: Primary care physician; Ashok Norris, MD

## 2015-08-16 NOTE — Care Management Note (Signed)
Case Management Note  Patient Details  Name: Kent [name] MRN: [id number] Date of Birth: June 21, 1966  Subjective/Objective:        To [facility] today.Social work is facilitating discharge.          Action/Plan:   Expected Discharge Date:                  Expected Discharge Plan:     In-House Referral:     Discharge planning Services     Post Acute Care Choice:    Choice offered to:     DME Arranged:    DME Agency:     HH Arranged:    [place] Agency:     Status of Service:     Medicare Important Message Given:  Yes Date Medicare IM Given:    Medicare IM give by:    Date Additional Medicare IM Given:    Additional Medicare Important Message give by:     If discussed at [place] of Stay Meetings, dates discussed:    Additional Comments:  Eran Mistry A, RN 08/16/2015, 11:38 AM

## 2015-08-16 NOTE — Progress Notes (Signed)
Notified by attending physician Dr. Volanda Napoleon that family was considering comfort care at the hospice home. Patient with recurrent aspiration and fevers. He has received IV antibiotics and continues with fever. Dr. Volanda Napoleon reports that he  had another episode of aspiration last night, fever 102 this am she also reported that patient was on Cpap d/t decreased responsiveness and poor air movement.  Patient seen lying in bed, eyes closed, Cpap in place. He was transitioned to nasal cannula at 3 liters during visit. Mouth care performed. Patient more alert and aware his mother was at bedside. Father present as well as paid caregiver. Discussed care at the hospice home versus having the patient return back home. Family expressed concerns that they would be unable to keep patient comfortable at home. Emotional support given. Family stated understanding that patient would be receiving comfort medications only and comfort feeds if he were alert enough. Both Mr. and Mrs. Linquist stated their understanding. Hospital care team including attending Dr. Volanda Napoleon made aware of family choice. Hospice team also made aware. Report called to hospice home. Writer to notify EMS for transport when discharge order is in place. Thank you.  Flo Shanks RN, BSN, [facility] [place], [facility] [phone]

## 2015-08-16 NOTE — Progress Notes (Signed)
Placed patient on auto cpap. Respirations increased since last seen by RT. Patient appears agitated and noted to be gasping for breaths even on auto cpap. Changed patient's mask from nasal to full face since he is breathing with mouth open. Noted that pressures on auto cpap increased from 8 to 11 to overcome potential obstruction. HR 95 RR 22 saturation with auto cpap and 3liters 94-95%. rn to give resting meds

## 2015-08-16 NOTE — Progress Notes (Signed)
Auto cpap reading 20 cm pressure

## 2015-08-16 NOTE — Progress Notes (Signed)
Tylenol suppository given for axillary temp 102.9. Will recheck in one hour and notify MD if nothing has changed. Nursing staff will continue to monitor. Earleen Reaper, RN

## 2015-08-16 NOTE — Progress Notes (Signed)
Temp 102.4 after receiving Tylenol 1.5hrs ago. Notified MD and orders for liquid Tylenol and Toradol IV to be placed. Nursing staff will continue to monitor. Earleen Reaper, RN

## 2015-08-16 NOTE — Progress Notes (Signed)
Patient discharged to hospice via EMS. Kent [name] called report to hospice house. IV removed and tele box off and returned. All dc instructions given and family verbalizes understanding.

## 2015-08-16 NOTE — Progress Notes (Signed)
EMS notified for transfer to the [facility]. Signed DNR in place on patient's chart. Hospital care team, family and hospice home aware. Thank you.  Flo Shanks RN, [facility] [place], hospital Liaison [phone] c

## 2015-08-16 NOTE — Clinical Social Work Note (Signed)
Clinical Social Worker was notified that pt will be discharged to [facility] today. [facility] Liaison has made arrangements for transfer. CSW prepared discharge packet. Report has been called and EMS will provide transportation. CSW is signing off as no further needs identified.   Darden Dates, MSW, LCSW Clinical Social Worker  [phone]

## 2015-08-16 NOTE — Progress Notes (Signed)
ANTICOAGULATION CONSULT NOTE - Initial Consult  Pharmacy Consult for warfarin Indication: VTE prophylaxis/atrial fibrillation  Allergies  Allergen Reactions  . Codeine Nausea Only  . Piperacillin-Tazobactam In Dex Rash and Other (See Comments)    Arm erythema on administration, but tolerated cephalosporins fine.    . Vancomycin Rash and Other (See Comments)    Reaction:  Red man syndrome   . Zosyn [Piperacillin Sod-Tazobactam So] Rash and Other (See Comments)    Pt experienced arm erythema on administration, but tolerated cephalosporins fine.     Patient Measurements: Height: 5\' 9"  (175.3 cm) Weight: 291 lb (131.997 kg) IBW/kg (Calculated) : 70.7  Vital Signs: Temp: 102.4 F (39.1 C) (12/05 0627) Temp Source: Axillary (12/05 0627) BP: 145/80 mmHg (12/05 0459) Pulse Rate: 105 (12/05 0459)  Labs:  Recent Labs  08/14/15 0512 08/14/15 1110 08/15/15 0441 08/16/15 0403  HGB  --   --  10.8*  --   HCT  --   --  33.4*  --   PLT  --   --  265  --   LABPROT 21.2*  --  19.2* 20.4*  INR 1.84  --  1.61 1.75  CREATININE  --  0.34* 0.36*  --     Estimated Creatinine Clearance: 150.4 mL/min (by C-G formula based on Cr of 0.36).   Medical History: Past Medical History  Diagnosis Date  . Frontotemporal dementia   . Sleep apnea   . Hypertension   . Depression   . Neurogenic bladder   . Myelopathy ([place])   . Obesity   . Degeneration, intervertebral disc, cervical   . Elevated liver function tests   . Pulmonary embolus ([facility])     on chronic anticoagulation  . Chronic UTI (urinary tract infection)   . Pernicious anemia   . Rosacea   . BPH (benign prostatic hypertrophy)   . History of DVT (deep vein thrombosis)   . History of pulmonary embolism     currently on coumadin  . Anxiety   . New onset a-fib [facility]) 2013   Assessment: Pharmacy consulted to dose warfarin in this 49 year old male admitted with altered mental status. Patient was taking warfarin prior to admission for  a history of DVT/PE as well as newly diagnosed atrial fibrillation. The patient's home regimen was warfarin 5 mg PO daily.  Patients INR on admission was supratherapeutic at 4.73. Patient did not receive vitamin K.  INR today has increased from yesterday but remains subtherapeutic at 1.75.  Dosing history: Date INR Dose 11/28 4.73 HELD 11/29 2.95 2.5 mg 11/30 1.79 5 mg 12/1 1.45 5 mg 12/2 1.58 5 mg 12/3     1.84     5 mg  12/4     1.61   7.5 mg 12/5 1.75  Goal of Therapy:  INR 2-3 Monitor platelets by anticoagulation protocol: Yes   Plan:  Continue with warfarin 7.5 mg dose tonight. Anticipate INR to continue to increase tomorrow.    INR ordered with AM labs tomorrow. Pharmacy will continue to monitor.  Darylene Price Wylie Coon 08/16/2015,8:07 AM

## 2015-08-17 ENCOUNTER — Telehealth: Payer: Self-pay | Admitting: Cardiovascular Disease

## 2015-08-17 NOTE — Telephone Encounter (Signed)
FYI patient dad cancelled appt on 08/19/15 b/c he is at hospice house

## 2015-08-17 NOTE — Telephone Encounter (Signed)
Will make Dr. Rockey Situ aware.

## 2015-08-18 ENCOUNTER — Telehealth: Payer: Self-pay | Admitting: Cardiovascular Disease

## 2015-08-18 NOTE — Telephone Encounter (Signed)
Patient is in hospice home.  Per Dad he is no longer eating and will be passing soon.   Deleting recall.

## 2015-08-19 ENCOUNTER — Ambulatory Visit: Payer: Medicare Other | Admitting: Cardiovascular Disease

## 2015-08-20 LAB — URINE CULTURE: SPECIAL REQUESTS: NORMAL

## 2015-08-21 LAB — CULTURE, BLOOD (ROUTINE X 2)
Culture: NO GROWTH
Culture: NO GROWTH

## 2015-09-12 DEATH — deceased

## 2016-12-03 IMAGING — CT CT CHEST-ABD-PELV W/ CM
2 of 5 series · 15 of 46 positions shown, 17 images · IV contrast (omnipaque)
Comparison: CT chest 08/05/2012.  CT abdomen and pelvis 10/09/2014.

CLINICAL DATA: Generalized weakness, decreased appetite, and
decreased level of consciousness for 2 weeks. Patient is nonverbal.
History of neurologic deficit since age 14.

EXAM:
CT CHEST, ABDOMEN, AND PELVIS WITH CONTRAST
TECHNIQUE: Multidetector CT imaging of the chest, abdomen and pelvis was
performed following the standard protocol during bolus
administration of intravenous contrast.
CONTRAST:  100 mL Omnipaque 350.

[Series 2: cap with · axial · 0.98mm/px · z∈[-274,+342]mm · 12 of 139 slices shown, 14 images]
[im 8/139  soft-tissue]
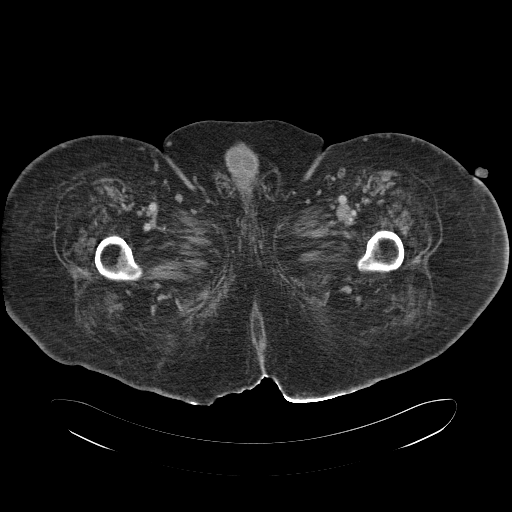
[im 8/139  bone]
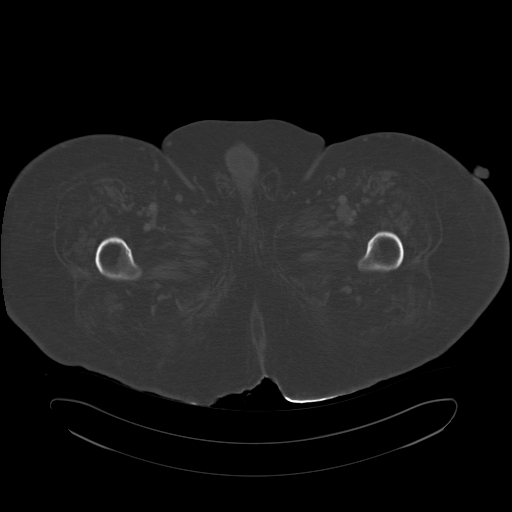
[im 22/139  soft-tissue]
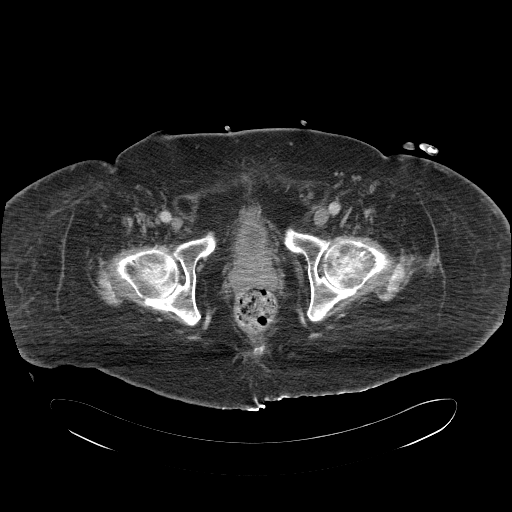
[im 30/139  soft-tissue]
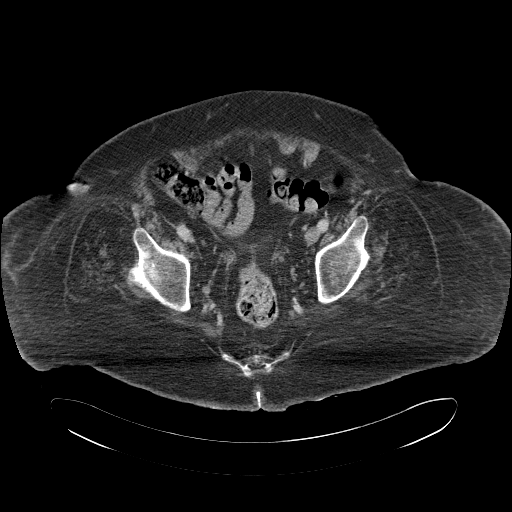
[im 44/139  soft-tissue]
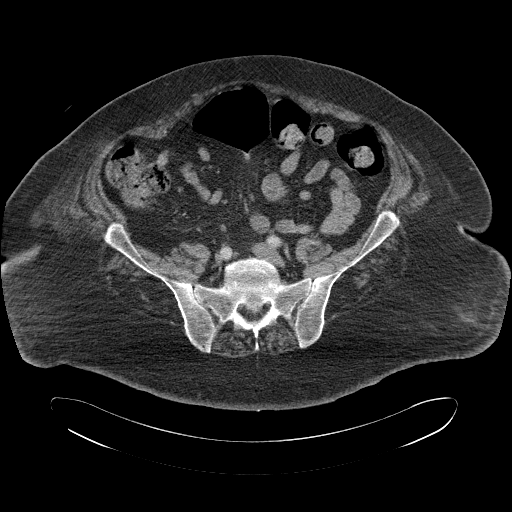
[im 51/139  soft-tissue]
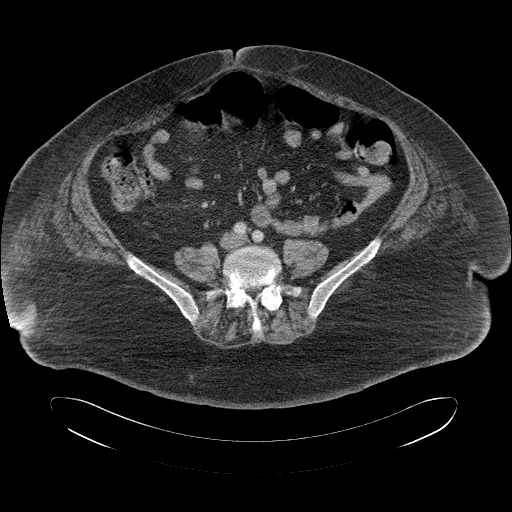
[im 66/139  soft-tissue]
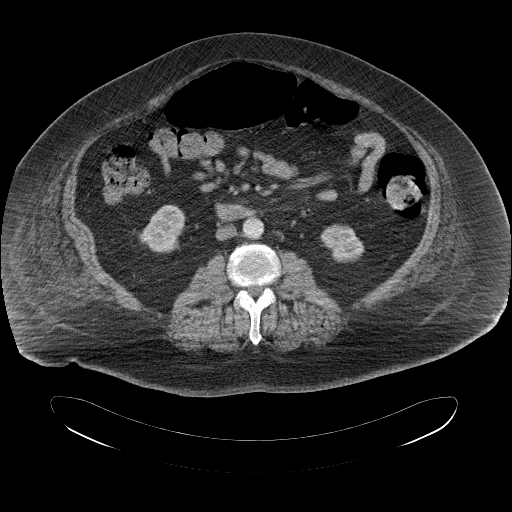
[im 73/139  soft-tissue]
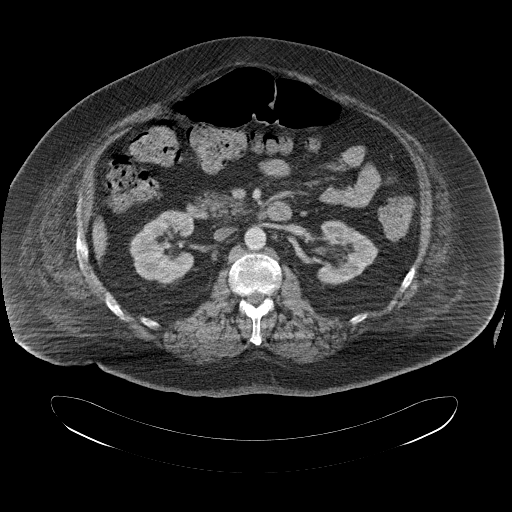
[im 88/139  soft-tissue]
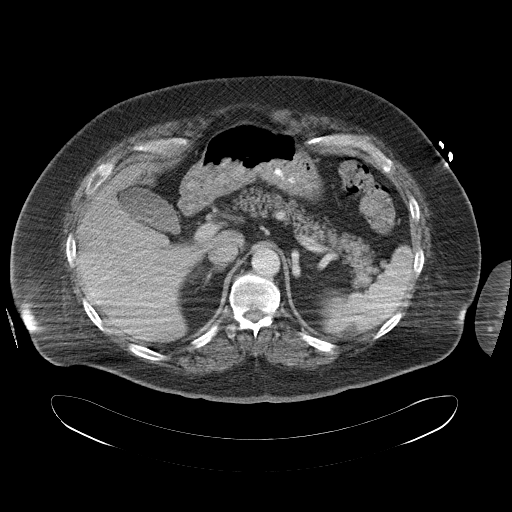
[im 95/139  soft-tissue]
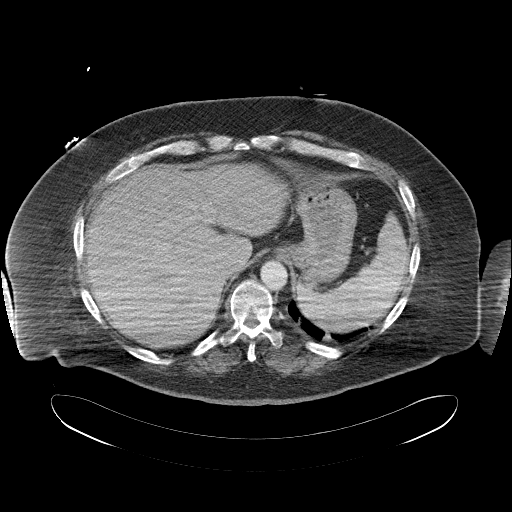
[im 95/139  bone]
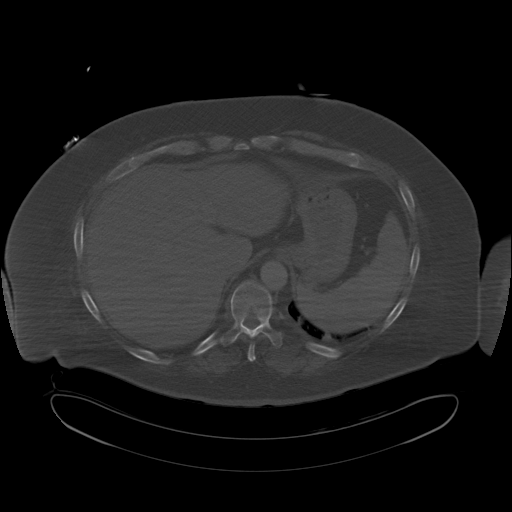
[im 109/139  soft-tissue]
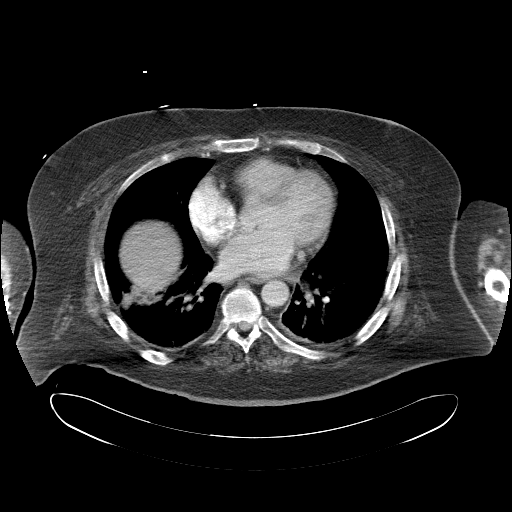
[im 117/139  soft-tissue]
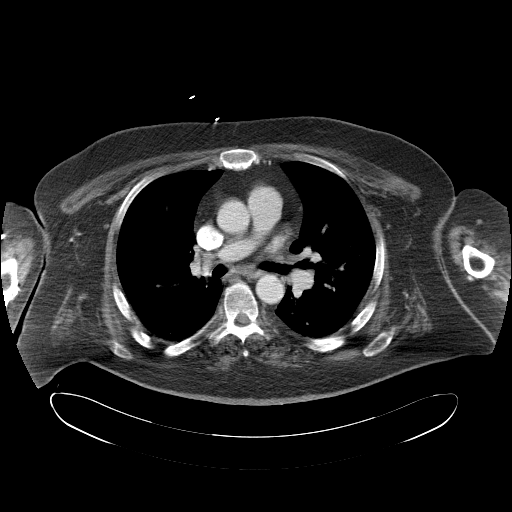
[im 131/139  soft-tissue]
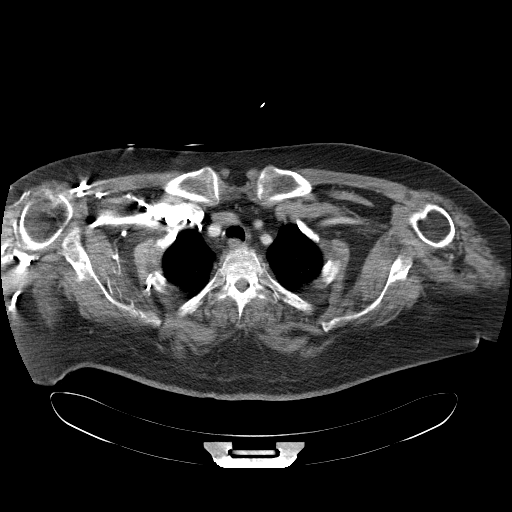

[Series 6: cor cap with cor · coronal · 1.01mm/px · 3 of 176 slices shown]
[im 59/176  soft-tissue]
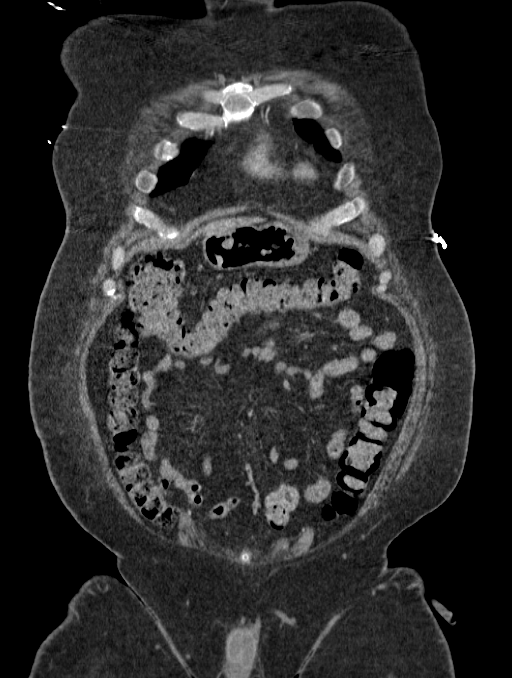
[im 78/176  soft-tissue]
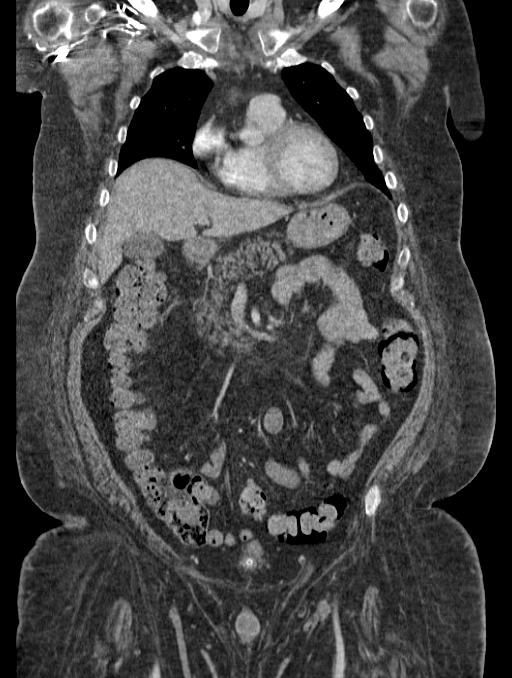
[im 98/176  soft-tissue]
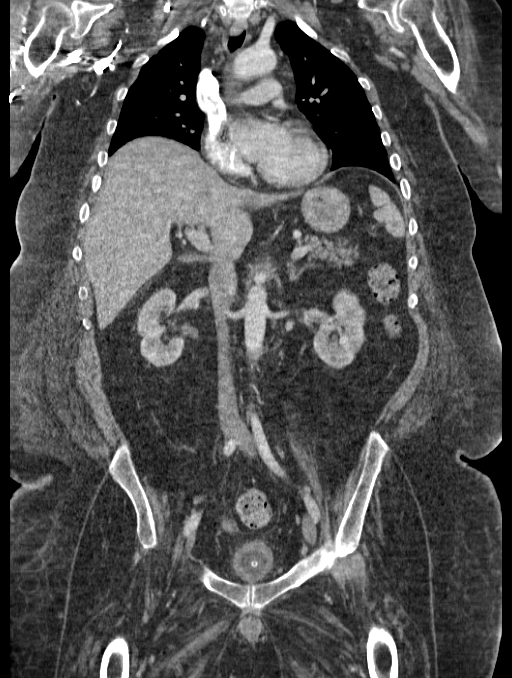

[15 of 46 positions shown; findings below may reference images not displayed]

FINDINGS: CT CHEST FINDINGS

Mediastinum: No gross pulmonary emboli within limits for detection
on routine Chest CT. Heart size is normal. No pericardial fluid,
thickening or calcification. No acute abnormality of the thoracic
aorta or other great vessels of the mediastinum. No pathologically
enlarged mediastinal or hilar lymph nodes. The esophagus is normal
in appearance.

Lungs/Pleura: No consolidative airspace disease. No pleural
effusions. No suspicious appearing pulmonary nodules or masses.
Hypoaeration at the lung bases with mild platelike atelectasis,
stable.

Musculoskeletal: No aggressive appearing lytic or blastic lesions
are noted in the visualized portions of the skeleton.

CT ABDOMEN AND PELVIS FINDINGS

BODY WALL: Unremarkable.  Atrophic change of the trunk musculature.

ABDOMEN/PELVIS:

Liver: No focal abnormality.

Biliary: No evidence of biliary obstruction or stone.

Pancreas: Unremarkable.

Spleen: Unremarkable. Stable small low-attenuation lesion in the
spleen likely benign cyst or hemangioma.

Adrenals: Unremarkable.

Kidneys and ureters: No hydronephrosis. Nonobstructing LEFT lower
pole renal calculus unchanged from priors.

Bladder: Decompressed with an indwelling suprapubic catheter.

Reproductive: Unremarkable.

Bowel: No obstruction. Not identified, but no signs of appendicitis
and no visualization on prior study. Moderate stool burden. Moderate
stool in the rectum, possible fecal impaction.

Retroperitoneum: No mass or adenopathy.

Peritoneum: No free fluid or gas.

Vascular: No acute abnormality.

OSSEOUS: No acute abnormalities.
IMPRESSION: No acute chest, abdomen, or pelvic findings. Suprapubic catheter
satisfactory position. Moderate stool burden, correlate clinically
for constipation.

## 2017-02-18 IMAGING — CR DG CHEST 1V
1 series · 1 of 1 positions shown · non-contrast
Comparison: 01/30/2015.  09/15/2014.  12/24/2013.

CLINICAL DATA: Sepsis.  Hypertension.  Initial encounter.

EXAM:
CHEST  1 VIEW

[portable]
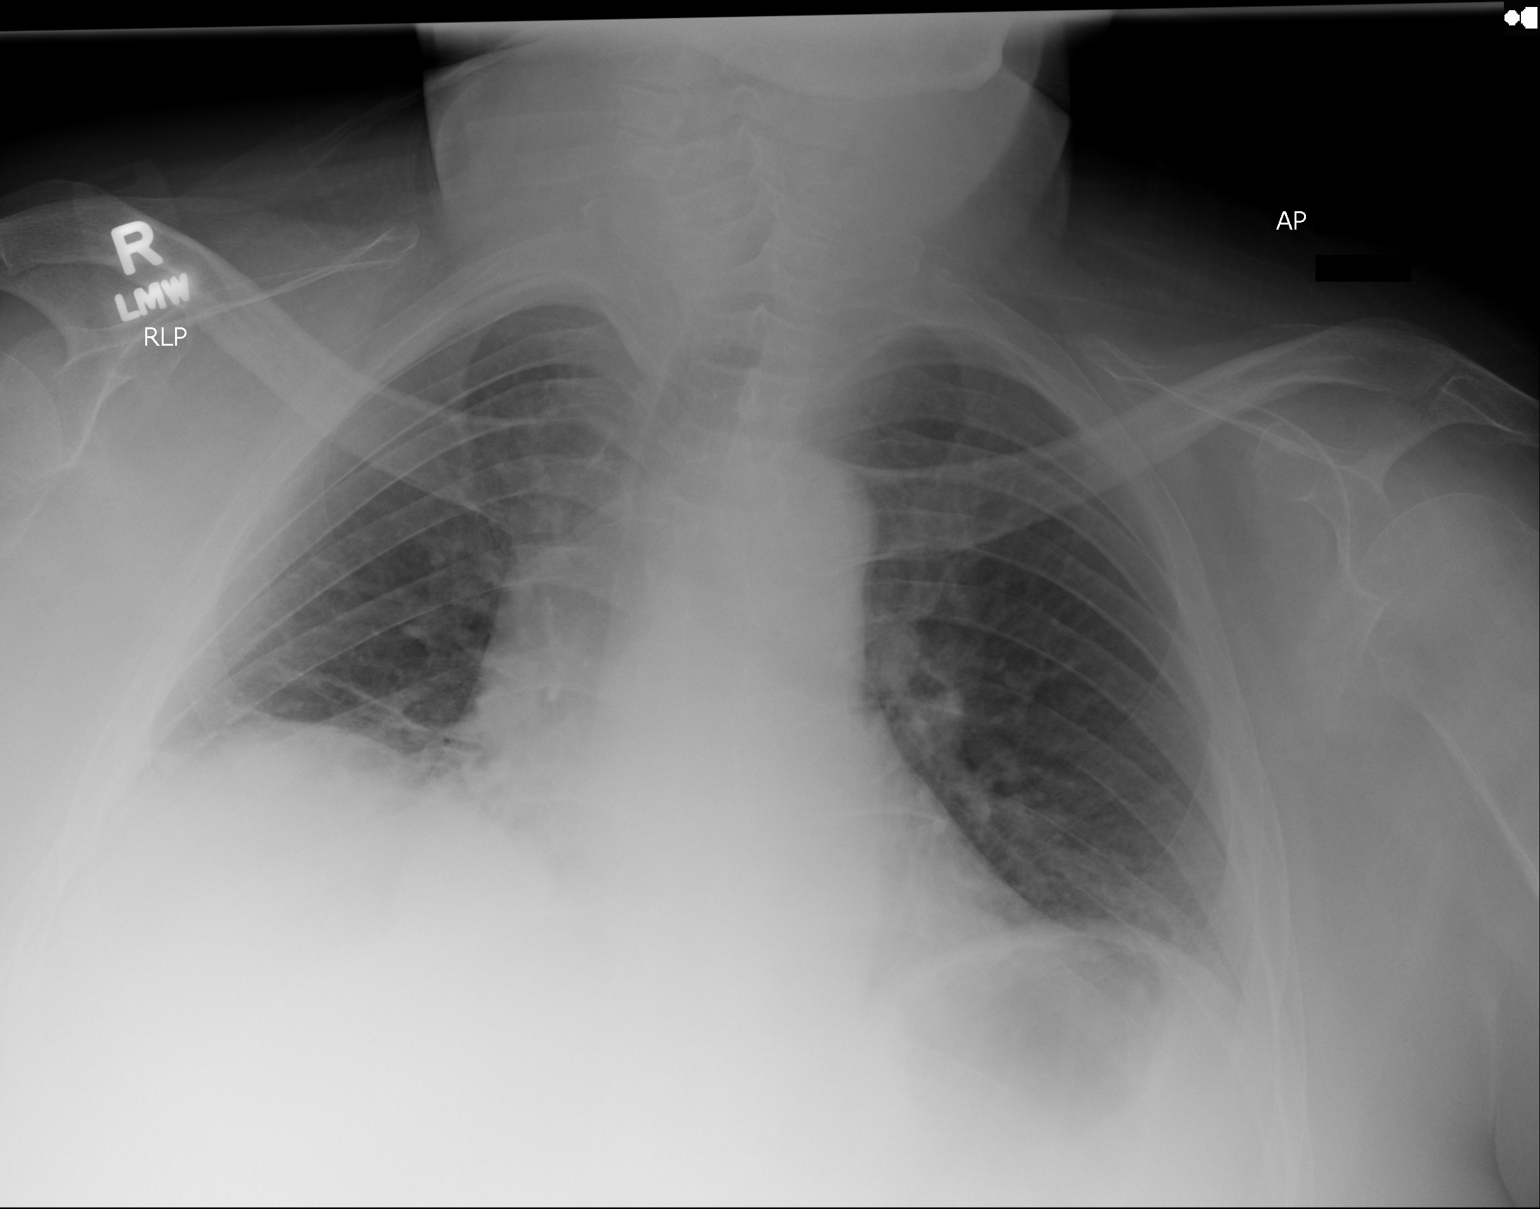

[1 of 1 positions shown; findings below may reference images not displayed]

FINDINGS: Chronically low lung volumes with RIGHT basilar atelectasis or
scarring. The RIGHT basilar volume loss is a chronic finding. The
cardiopericardial silhouette appears mildly enlarged, with the size
accentuated by low volume chest. Mediastinal contours are similar to
multiple prior exams. No focal consolidation to suggest pneumonia.
There is blunting of the RIGHT costophrenic angle which is probably
due to atelectasis rather than effusion.
IMPRESSION: Chronically low lung volumes with RIGHT basilar atelectasis and/ or
scarring.
